# Patient Record
Sex: Female | Born: 1980
Health system: Southern US, Community
[De-identification: ages and names within clinical notes are randomized; demographics above are authoritative.]

## PROBLEM LIST (undated history)

## (undated) DIAGNOSIS — K259 Gastric ulcer, unspecified as acute or chronic, without hemorrhage or perforation: Secondary | ICD-10-CM

## (undated) DIAGNOSIS — N83209 Unspecified ovarian cyst, unspecified side: Secondary | ICD-10-CM

## (undated) DIAGNOSIS — I10 Essential (primary) hypertension: Secondary | ICD-10-CM

## (undated) DIAGNOSIS — K2270 Barrett's esophagus without dysplasia: Secondary | ICD-10-CM

## (undated) DIAGNOSIS — D259 Leiomyoma of uterus, unspecified: Secondary | ICD-10-CM

## (undated) DIAGNOSIS — J939 Pneumothorax, unspecified: Secondary | ICD-10-CM

## (undated) HISTORY — PX: ABDOMINAL HYSTERECTOMY: SHX81

## (undated) HISTORY — PX: HAND SURGERY: SHX662

## (undated) HISTORY — PX: CARPAL TUNNEL RELEASE: SHX101

## (undated) HISTORY — PX: OTHER SURGICAL HISTORY: SHX169

## (undated) HISTORY — PX: HERNIA REPAIR: SHX51

## (undated) HISTORY — PX: BREAST LUMPECTOMY: SHX2

## (undated) HISTORY — PX: BREAST SURGERY: SHX581

## (undated) HISTORY — PX: HEMORRHOID SURGERY: SHX153

---

## 2009-12-21 ENCOUNTER — Emergency Department (HOSPITAL_BASED_OUTPATIENT_CLINIC_OR_DEPARTMENT_OTHER): Admission: EM | Admit: 2009-12-21 | Discharge: 2009-12-21 | Payer: Self-pay | Admitting: Emergency Medicine

## 2009-12-28 ENCOUNTER — Emergency Department (HOSPITAL_BASED_OUTPATIENT_CLINIC_OR_DEPARTMENT_OTHER): Admission: EM | Admit: 2009-12-28 | Discharge: 2009-12-28 | Payer: Self-pay | Admitting: Emergency Medicine

## 2009-12-28 ENCOUNTER — Ambulatory Visit: Payer: Self-pay | Admitting: Diagnostic Radiology

## 2010-04-23 ENCOUNTER — Ambulatory Visit: Payer: Self-pay | Admitting: Diagnostic Radiology

## 2010-04-23 ENCOUNTER — Emergency Department (HOSPITAL_BASED_OUTPATIENT_CLINIC_OR_DEPARTMENT_OTHER): Admission: EM | Admit: 2010-04-23 | Discharge: 2010-04-23 | Payer: Self-pay | Admitting: Emergency Medicine

## 2010-05-15 ENCOUNTER — Ambulatory Visit (HOSPITAL_COMMUNITY): Admission: RE | Admit: 2010-05-15 | Discharge: 2010-05-15 | Payer: Self-pay | Admitting: General Surgery

## 2010-06-15 ENCOUNTER — Ambulatory Visit: Payer: Self-pay | Admitting: Obstetrics and Gynecology

## 2010-06-21 ENCOUNTER — Ambulatory Visit (HOSPITAL_COMMUNITY): Admission: RE | Admit: 2010-06-21 | Discharge: 2010-06-21 | Payer: Self-pay | Admitting: Family Medicine

## 2010-06-30 ENCOUNTER — Ambulatory Visit: Payer: Self-pay | Admitting: Obstetrics and Gynecology

## 2010-06-30 ENCOUNTER — Encounter (INDEPENDENT_AMBULATORY_CARE_PROVIDER_SITE_OTHER): Payer: Self-pay | Admitting: *Deleted

## 2010-06-30 LAB — CONVERTED CEMR LAB: TSH: 0.292 microintl units/mL — ABNORMAL LOW (ref 0.350–4.500)

## 2010-07-13 ENCOUNTER — Ambulatory Visit: Payer: Self-pay | Admitting: Obstetrics & Gynecology

## 2010-07-13 ENCOUNTER — Encounter: Payer: Self-pay | Admitting: Obstetrics & Gynecology

## 2010-07-13 LAB — CONVERTED CEMR LAB
Free T4: 0.92 ng/dL (ref 0.80–1.80)
T3, Free: 2.7 pg/mL (ref 2.3–4.2)

## 2010-08-08 ENCOUNTER — Emergency Department (HOSPITAL_BASED_OUTPATIENT_CLINIC_OR_DEPARTMENT_OTHER): Admission: EM | Admit: 2010-08-08 | Discharge: 2010-08-08 | Payer: Self-pay | Admitting: Emergency Medicine

## 2010-08-08 ENCOUNTER — Ambulatory Visit: Payer: Self-pay | Admitting: Diagnostic Radiology

## 2011-02-25 LAB — URINALYSIS, ROUTINE W REFLEX MICROSCOPIC
Bilirubin Urine: NEGATIVE
Glucose, UA: NEGATIVE mg/dL
Ketones, ur: NEGATIVE mg/dL
Leukocytes, UA: NEGATIVE
Nitrite: NEGATIVE
Protein, ur: NEGATIVE mg/dL
Specific Gravity, Urine: 1.02 (ref 1.005–1.030)
Urobilinogen, UA: 1 mg/dL (ref 0.0–1.0)
pH: 6 (ref 5.0–8.0)

## 2011-02-25 LAB — URINE CULTURE
Colony Count: NO GROWTH
Culture: NO GROWTH

## 2011-02-25 LAB — URINE MICROSCOPIC-ADD ON

## 2011-02-26 LAB — CBC
HCT: 34 % — ABNORMAL LOW (ref 36.0–46.0)
HCT: 34.6 % — ABNORMAL LOW (ref 36.0–46.0)
Hemoglobin: 11.6 g/dL — ABNORMAL LOW (ref 12.0–15.0)
Hemoglobin: 11.4 g/dL — ABNORMAL LOW (ref 12.0–15.0)
MCHC: 34.2 g/dL (ref 30.0–36.0)
MCHC: 33.1 g/dL (ref 30.0–36.0)
MCV: 84 fL (ref 78.0–100.0)
MCV: 83.2 fL (ref 78.0–100.0)
Platelets: 264 10*3/uL (ref 150–400)
Platelets: 305 10*3/uL (ref 150–400)
RBC: 4.05 MIL/uL (ref 3.87–5.11)
RBC: 4.16 MIL/uL (ref 3.87–5.11)
RDW: 16.2 % — ABNORMAL HIGH (ref 11.5–15.5)
RDW: 15.1 % (ref 11.5–15.5)
WBC: 6.8 10*3/uL (ref 4.0–10.5)
WBC: 7.7 10*3/uL (ref 4.0–10.5)

## 2011-02-26 LAB — URINALYSIS, ROUTINE W REFLEX MICROSCOPIC
Bilirubin Urine: NEGATIVE
Glucose, UA: NEGATIVE mg/dL
Hgb urine dipstick: NEGATIVE
Ketones, ur: NEGATIVE mg/dL
Nitrite: NEGATIVE
Protein, ur: NEGATIVE mg/dL
Specific Gravity, Urine: 1.019 (ref 1.005–1.030)
Urobilinogen, UA: 1 mg/dL (ref 0.0–1.0)
pH: 5.5 (ref 5.0–8.0)

## 2011-02-26 LAB — BASIC METABOLIC PANEL
BUN: 7 mg/dL (ref 6–23)
CO2: 26 mEq/L (ref 19–32)
Calcium: 9.3 mg/dL (ref 8.4–10.5)
Chloride: 105 mEq/L (ref 96–112)
Creatinine, Ser: 0.63 mg/dL (ref 0.4–1.2)
GFR calc Af Amer: >60 mL/min (ref >60)
GFR calc non Af Amer: >60 mL/min (ref >60)
Glucose, Bld: 93 mg/dL (ref 70–99)
Potassium: 4.5 mEq/L (ref 3.5–5.1)
Sodium: 134 mEq/L — ABNORMAL LOW (ref 135–145)

## 2011-02-26 LAB — COMPREHENSIVE METABOLIC PANEL
ALT: 6 U/L (ref 0–35)
AST: 18 U/L (ref 0–37)
Albumin: 4.2 g/dL (ref 3.5–5.2)
Alkaline Phosphatase: 101 U/L (ref 39–117)
BUN: 9 mg/dL (ref 6–23)
CO2: 24 mEq/L (ref 19–32)
Calcium: 9.5 mg/dL (ref 8.4–10.5)
Chloride: 107 mEq/L (ref 96–112)
Creatinine, Ser: 0.6 mg/dL (ref 0.4–1.2)
GFR calc Af Amer: >60 mL/min (ref >60)
GFR calc non Af Amer: >60 mL/min (ref >60)
Glucose, Bld: 92 mg/dL (ref 70–99)
Potassium: 3.8 mEq/L (ref 3.5–5.1)
Sodium: 143 mEq/L (ref 135–145)
Total Bilirubin: 0.4 mg/dL (ref 0.3–1.2)
Total Protein: 8.1 g/dL (ref 6.0–8.3)

## 2011-02-26 LAB — PREGNANCY, URINE: Preg Test, Ur: NEGATIVE

## 2011-02-26 LAB — DIFFERENTIAL
Basophils Absolute: 0.1 10*3/uL (ref 0.0–0.1)
Basophils Relative: 1 % (ref 0–1)
Eosinophils Absolute: 0.1 10*3/uL (ref 0.0–0.7)
Eosinophils Relative: 1 % (ref 0–5)
Lymphocytes Relative: 43 % (ref 12–46)
Lymphs Abs: 3.3 10*3/uL (ref 0.7–4.0)
Monocytes Absolute: 0.7 10*3/uL (ref 0.1–1.0)
Monocytes Relative: 9 % (ref 3–12)
Neutro Abs: 3.5 10*3/uL (ref 1.7–7.7)
Neutrophils Relative %: 46 % (ref 43–77)

## 2011-02-26 LAB — LIPASE, BLOOD: Lipase: 248 U/L (ref 23–300)

## 2012-03-18 ENCOUNTER — Emergency Department (HOSPITAL_BASED_OUTPATIENT_CLINIC_OR_DEPARTMENT_OTHER)
Admission: EM | Admit: 2012-03-18 | Discharge: 2012-03-18 | Disposition: A | Discharge status: Home / Self Care | Payer: No Typology Code available for payment source | Attending: Emergency Medicine | Admitting: Emergency Medicine

## 2012-03-18 ENCOUNTER — Encounter (HOSPITAL_BASED_OUTPATIENT_CLINIC_OR_DEPARTMENT_OTHER): Payer: Self-pay

## 2012-03-18 DIAGNOSIS — M549 Dorsalgia, unspecified: Secondary | ICD-10-CM

## 2012-03-18 DIAGNOSIS — R51 Headache: Secondary | ICD-10-CM | POA: Insufficient documentation

## 2012-03-18 MED ORDER — CLOTRIMAZOLE 1 % EX CREA: TOPICAL_CREAM | CUTANEOUS | Status: DC

## 2012-03-18 MED ORDER — HYDROCODONE-ACETAMINOPHEN 5-325 MG PO TABS: 2.0000 | ORAL_TABLET | ORAL | Status: AC | PRN

## 2012-03-18 MED ORDER — IBUPROFEN 800 MG PO TABS: 800.0000 mg | ORAL_TABLET | Freq: Three times a day (TID) | ORAL | Status: AC

## 2012-03-18 MED ORDER — CLOTRIMAZOLE 1 % EX CREA: TOPICAL_CREAM | CUTANEOUS | Status: AC

## 2012-03-18 NOTE — Discharge Instructions (Signed)
Back Pain, Adult Low back pain is very common. About 1 in 5 people have back pain.The cause of low back pain is rarely dangerous. The pain often gets better over time.About half of people with a sudden onset of back pain feel better in just 2 weeks. About 8 in 10 people feel better by 6 weeks.  CAUSES Some common causes of back pain include:  Strain of the muscles or ligaments supporting the spine.   Wear and tear (degeneration) of the spinal discs.   Arthritis.   Direct injury to the back.  DIAGNOSIS Most of the time, the direct cause of low back pain is not known.However, back pain can be treated effectively even when the exact cause of the pain is unknown.Answering your caregiver's questions about your overall health and symptoms is one of the most accurate ways to make sure the cause of your pain is not dangerous. If your caregiver needs more information, he or she may order lab work or imaging tests (X-rays or MRIs).However, even if imaging tests show changes in your back, this usually does not require surgery. HOME CARE INSTRUCTIONS For many people, back pain returns.Since low back pain is rarely dangerous, it is often a condition that people can learn to manageon their own.   Remain active. It is stressful on the back to sit or stand in one place. Do not sit, drive, or stand in one place for more than 30 minutes at a time. Take short walks on level surfaces as soon as pain allows.Try to increase the length of time you walk each day.   Do not stay in bed.Resting more than 1 or 2 days can delay your recovery.   Do not avoid exercise or work.Your body is made to move.It is not dangerous to be active, even though your back may hurt.Your back will likely heal faster if you return to being active before your pain is gone.   Pay attention to your body when you bend and lift. Many people have less discomfortwhen lifting if they bend their knees, keep the load close to their  bodies,and avoid twisting. Often, the most comfortable positions are those that put less stress on your recovering back.   Find a comfortable position to sleep. Use a firm mattress and lie on your side with your knees slightly bent. If you lie on your back, put a pillow under your knees.   Only take over-the-counter or prescription medicines as directed by your caregiver. Over-the-counter medicines to reduce pain and inflammation are often the most helpful.Your caregiver may prescribe muscle relaxant drugs.These medicines help dull your pain so you can more quickly return to your normal activities and healthy exercise.   Put ice on the injured area.   Put ice in a plastic bag.   Place a towel between your skin and the bag.   Leave the ice on for 15 to 20 minutes, 3 to 4 times a day for the first 2 to 3 days. After that, ice and heat may be alternated to reduce pain and spasms.   Ask your caregiver about trying back exercises and gentle massage. This may be of some benefit.   Avoid feeling anxious or stressed.Stress increases muscle tension and can worsen back pain.It is important to recognize when you are anxious or stressed and learn ways to manage it.Exercise is a great option.  SEEK MEDICAL CARE IF:  You have pain that is not relieved with rest or medicine.   You have   pain that does not improve in 1 week.   You have new symptoms.   You are generally not feeling well.  SEEK IMMEDIATE MEDICAL CARE IF:   You have pain that radiates from your back into your legs.   You develop new bowel or bladder control problems.   You have unusual weakness or numbness in your arms or legs.   You develop nausea or vomiting.   You develop abdominal pain.   You feel faint.  Document Released: 11/26/2005 Document Revised: 11/15/2011 Document Reviewed: 04/16/2011 [employer] Patient Information 2012 [employer]. 
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
## 2012-03-19 NOTE — ED Provider Notes (Signed)
Medical screening examination/treatment/procedure(s) were performed by non-physician practitioner and as supervising physician I was immediately available for consultation/collaboration.  Vaibhav Fogleman, MD 03/19/12 0025 

## 2013-09-12 ENCOUNTER — Emergency Department (HOSPITAL_BASED_OUTPATIENT_CLINIC_OR_DEPARTMENT_OTHER): Payer: No Typology Code available for payment source

## 2013-09-12 ENCOUNTER — Encounter (HOSPITAL_BASED_OUTPATIENT_CLINIC_OR_DEPARTMENT_OTHER): Payer: Self-pay | Admitting: Emergency Medicine

## 2013-09-12 ENCOUNTER — Emergency Department (HOSPITAL_BASED_OUTPATIENT_CLINIC_OR_DEPARTMENT_OTHER)
Admission: EM | Admit: 2013-09-12 | Discharge: 2013-09-12 | Disposition: A | Discharge status: Home / Self Care | Payer: No Typology Code available for payment source | Attending: Emergency Medicine | Admitting: Emergency Medicine

## 2013-09-12 DIAGNOSIS — R0789 Other chest pain: Secondary | ICD-10-CM

## 2013-09-12 DIAGNOSIS — S139XXA Sprain of joints and ligaments of unspecified parts of neck, initial encounter: Secondary | ICD-10-CM | POA: Insufficient documentation

## 2013-09-12 DIAGNOSIS — Z8679 Personal history of other diseases of the circulatory system: Secondary | ICD-10-CM | POA: Insufficient documentation

## 2013-09-12 DIAGNOSIS — S161XXA Strain of muscle, fascia and tendon at neck level, initial encounter: Secondary | ICD-10-CM

## 2013-09-12 DIAGNOSIS — Y92410 Unspecified street and highway as the place of occurrence of the external cause: Secondary | ICD-10-CM | POA: Insufficient documentation

## 2013-09-12 DIAGNOSIS — S298XXA Other specified injuries of thorax, initial encounter: Secondary | ICD-10-CM | POA: Insufficient documentation

## 2013-09-12 DIAGNOSIS — Y9389 Activity, other specified: Secondary | ICD-10-CM | POA: Insufficient documentation

## 2013-09-12 DIAGNOSIS — S0990XA Unspecified injury of head, initial encounter: Secondary | ICD-10-CM | POA: Insufficient documentation

## 2013-09-12 DIAGNOSIS — F17200 Nicotine dependence, unspecified, uncomplicated: Secondary | ICD-10-CM | POA: Insufficient documentation

## 2013-09-12 DIAGNOSIS — S3981XA Other specified injuries of abdomen, initial encounter: Secondary | ICD-10-CM | POA: Insufficient documentation

## 2013-09-12 MED ORDER — CYCLOBENZAPRINE HCL 10 MG PO TABS: 10.0000 mg | ORAL_TABLET | Freq: Two times a day (BID) | ORAL | Status: DC | PRN

## 2013-09-12 MED ORDER — HYDROCODONE-ACETAMINOPHEN 5-325 MG PO TABS: 1.0000 | ORAL_TABLET | ORAL | Status: DC | PRN

## 2013-09-12 MED ORDER — ONDANSETRON HCL 4 MG/2ML IJ SOLN: 4.0000 mg | Freq: Once | INTRAMUSCULAR | Status: DC

## 2013-09-12 MED ORDER — HYDROMORPHONE HCL PF 1 MG/ML IJ SOLN: 0.5000 mg | Freq: Once | INTRAMUSCULAR | Status: DC

## 2013-09-12 MED ORDER — NAPROXEN 500 MG PO TABS: 500.0000 mg | ORAL_TABLET | Freq: Two times a day (BID) | ORAL | Status: DC

## 2013-09-12 MED ORDER — SODIUM CHLORIDE 0.9 % IV SOLN: INTRAVENOUS | Status: DC

## 2013-09-12 NOTE — ED Provider Notes (Signed)
Medical screening examination/treatment/procedure(s) were performed by non-physician practitioner and as supervising physician I was immediately available for consultation/collaboration.  Doug Sou, MD 09/12/13 1734
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
## 2013-09-15 ENCOUNTER — Encounter (HOSPITAL_BASED_OUTPATIENT_CLINIC_OR_DEPARTMENT_OTHER): Payer: Self-pay | Admitting: Emergency Medicine

## 2013-09-15 DIAGNOSIS — Z791 Long term (current) use of non-steroidal anti-inflammatories (NSAID): Secondary | ICD-10-CM | POA: Insufficient documentation

## 2013-09-15 DIAGNOSIS — G43909 Migraine, unspecified, not intractable, without status migrainosus: Secondary | ICD-10-CM | POA: Insufficient documentation

## 2013-09-15 DIAGNOSIS — M545 Low back pain, unspecified: Secondary | ICD-10-CM | POA: Insufficient documentation

## 2013-09-15 DIAGNOSIS — F17200 Nicotine dependence, unspecified, uncomplicated: Secondary | ICD-10-CM | POA: Insufficient documentation

## 2013-09-15 DIAGNOSIS — G8911 Acute pain due to trauma: Secondary | ICD-10-CM | POA: Insufficient documentation

## 2013-09-15 DIAGNOSIS — M25519 Pain in unspecified shoulder: Secondary | ICD-10-CM | POA: Insufficient documentation
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
## 2013-09-16 ENCOUNTER — Emergency Department (HOSPITAL_BASED_OUTPATIENT_CLINIC_OR_DEPARTMENT_OTHER)
Admission: EM | Admit: 2013-09-16 | Discharge: 2013-09-16 | Disposition: A | Discharge status: Home / Self Care | Payer: No Typology Code available for payment source | Attending: Emergency Medicine | Admitting: Emergency Medicine

## 2013-09-16 DIAGNOSIS — S46911D Strain of unspecified muscle, fascia and tendon at shoulder and upper arm level, right arm, subsequent encounter: Secondary | ICD-10-CM

## 2013-09-16 DIAGNOSIS — S39012D Strain of muscle, fascia and tendon of lower back, subsequent encounter: Secondary | ICD-10-CM

## 2013-09-16 MED ORDER — METHOCARBAMOL 500 MG PO TABS: 1000.0000 mg | ORAL_TABLET | Freq: Four times a day (QID) | ORAL | Status: DC

## 2013-09-16 MED ORDER — OXYCODONE-ACETAMINOPHEN 5-325 MG PO TABS: 1.0000 | ORAL_TABLET | Freq: Four times a day (QID) | ORAL | Status: DC | PRN

## 2013-09-16 MED ORDER — OXYCODONE-ACETAMINOPHEN 5-325 MG PO TABS
2.0000 | ORAL_TABLET | Freq: Once | ORAL | Status: AC
  Administered 2013-09-16: 2 via ORAL
  Filled 2013-09-16: qty 2

## 2013-09-16 MED ORDER — CYCLOBENZAPRINE HCL 10 MG PO TABS
10.0000 mg | ORAL_TABLET | Freq: Once | ORAL | Status: AC
  Administered 2013-09-16: 10 mg via ORAL
  Filled 2013-09-16: qty 1

## 2013-09-16 NOTE — ED Notes (Signed)
MD at bedside. 

## 2013-09-16 NOTE — ED Notes (Signed)
See downtime form for primary and secondary assessment
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
## 2013-09-24 ENCOUNTER — Encounter (HOSPITAL_BASED_OUTPATIENT_CLINIC_OR_DEPARTMENT_OTHER): Payer: Self-pay | Admitting: Emergency Medicine

## 2013-09-24 ENCOUNTER — Emergency Department (HOSPITAL_BASED_OUTPATIENT_CLINIC_OR_DEPARTMENT_OTHER)
Admission: EM | Admit: 2013-09-24 | Discharge: 2013-09-24 | Disposition: A | Discharge status: Home / Self Care | Payer: No Typology Code available for payment source | Attending: Emergency Medicine | Admitting: Emergency Medicine

## 2013-09-24 DIAGNOSIS — F17200 Nicotine dependence, unspecified, uncomplicated: Secondary | ICD-10-CM | POA: Insufficient documentation

## 2013-09-24 DIAGNOSIS — Z79899 Other long term (current) drug therapy: Secondary | ICD-10-CM | POA: Insufficient documentation

## 2013-09-24 DIAGNOSIS — G43909 Migraine, unspecified, not intractable, without status migrainosus: Secondary | ICD-10-CM | POA: Insufficient documentation

## 2013-09-24 DIAGNOSIS — Y92410 Unspecified street and highway as the place of occurrence of the external cause: Secondary | ICD-10-CM | POA: Insufficient documentation

## 2013-09-24 DIAGNOSIS — M25512 Pain in left shoulder: Secondary | ICD-10-CM

## 2013-09-24 DIAGNOSIS — S46909A Unspecified injury of unspecified muscle, fascia and tendon at shoulder and upper arm level, unspecified arm, initial encounter: Secondary | ICD-10-CM | POA: Insufficient documentation

## 2013-09-24 DIAGNOSIS — S4980XA Other specified injuries of shoulder and upper arm, unspecified arm, initial encounter: Secondary | ICD-10-CM | POA: Insufficient documentation

## 2013-09-24 DIAGNOSIS — Z791 Long term (current) use of non-steroidal anti-inflammatories (NSAID): Secondary | ICD-10-CM | POA: Insufficient documentation

## 2013-09-24 DIAGNOSIS — Y9389 Activity, other specified: Secondary | ICD-10-CM | POA: Insufficient documentation

## 2013-09-24 MED ORDER — METHOCARBAMOL 500 MG PO TABS: 1000.0000 mg | ORAL_TABLET | Freq: Four times a day (QID) | ORAL | Status: DC
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
## 2013-10-07 NOTE — ED Provider Notes (Signed)
Medical screening examination/treatment/procedure(s) were performed by non-physician practitioner and as supervising physician I was immediately available for consultation/collaboration.   Tresia Revolorio J. Kiyon Fidalgo, MD 10/07/13 0506 

## 2013-10-26 NOTE — ED Notes (Signed)
Pt called requesting that work note be reprinted. Pt will show photo ID and note will be left at registration desk.

## 2013-10-26 NOTE — ED Notes (Signed)
Pt called requesting work note reprinted. 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

## 2014-07-05 ENCOUNTER — Encounter (HOSPITAL_BASED_OUTPATIENT_CLINIC_OR_DEPARTMENT_OTHER): Payer: Self-pay | Admitting: Emergency Medicine

## 2014-07-05 ENCOUNTER — Emergency Department (HOSPITAL_BASED_OUTPATIENT_CLINIC_OR_DEPARTMENT_OTHER)
Admission: EM | Admit: 2014-07-05 | Discharge: 2014-07-05 | Disposition: A | Discharge status: Home / Self Care | Payer: Medicaid Other | Attending: Emergency Medicine | Admitting: Emergency Medicine

## 2014-07-05 DIAGNOSIS — Z79899 Other long term (current) drug therapy: Secondary | ICD-10-CM | POA: Diagnosis not present

## 2014-07-05 DIAGNOSIS — G5603 Carpal tunnel syndrome, bilateral upper limbs: Secondary | ICD-10-CM

## 2014-07-05 DIAGNOSIS — M25539 Pain in unspecified wrist: Secondary | ICD-10-CM | POA: Insufficient documentation

## 2014-07-05 DIAGNOSIS — G43909 Migraine, unspecified, not intractable, without status migrainosus: Secondary | ICD-10-CM | POA: Diagnosis not present

## 2014-07-05 DIAGNOSIS — F17200 Nicotine dependence, unspecified, uncomplicated: Secondary | ICD-10-CM | POA: Diagnosis not present

## 2014-07-05 DIAGNOSIS — Z791 Long term (current) use of non-steroidal anti-inflammatories (NSAID): Secondary | ICD-10-CM | POA: Diagnosis not present

## 2014-07-05 DIAGNOSIS — G5600 Carpal tunnel syndrome, unspecified upper limb: Secondary | ICD-10-CM | POA: Insufficient documentation

## 2014-07-05 MED ORDER — IBUPROFEN 800 MG PO TABS
800.0000 mg | ORAL_TABLET | Freq: Once | ORAL | Status: AC
  Administered 2014-07-05: 800 mg via ORAL
  Filled 2014-07-05: qty 1

## 2014-07-05 MED ORDER — IBUPROFEN 600 MG PO TABS: 600.0000 mg | ORAL_TABLET | Freq: Four times a day (QID) | ORAL | Status: DC | PRN

## 2014-07-05 NOTE — ED Notes (Signed)
bilat velcro splints applied by Peggye Form RN

## 2014-07-05 NOTE — ED Provider Notes (Signed)
CSN: [id number]     Arrival date & time 07/05/14  1858 History  This chart was scribed for Osvaldo Shipper, MD by Lowella Petties, ED Scribe. The patient was seen in room MH02/MH02. Patient's care was started at 9:25 PM.    Chief Complaint  Patient presents with  . Hand Pain   Patient is a 33 y.o. female presenting with hand pain. The history is provided by the patient. No language interpreter was used.  Hand Pain This is a new problem. The current episode started more than 2 days ago. The problem has been gradually worsening. Pertinent negatives include no chest pain, no abdominal pain, no headaches and no shortness of breath. The symptoms are aggravated by bending. The symptoms are relieved by relaxation. She has tried nothing for the symptoms.   HPI Comments: Tiffany Christensen is a 33 y.o. female who presents to the Emergency Department complaining of pain in her wrists and severe numbness and tingling in her hands bilaterally. She reports that the pain radiates into her arms. She states that her the sensation is worse on her right side. 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 She denies injury.   Past Medical History  Diagnosis Date  . Migraine    Past Surgical History  Procedure Laterality Date  . Hemmorhoidectomy    . Hernia repair    . Breast surgery     History reviewed. No pertinent family history. History  Substance Use Topics  . Smoking status: Current Every Day Smoker -- 0.50 packs/day    Types: Cigarettes  . Smokeless tobacco: Not on file  . Alcohol Use: No   OB History   Grav Para Term Preterm Abortions TAB SAB Ect Mult Living                 Review of Systems  Respiratory: Negative for shortness of breath.   Cardiovascular: Negative for chest pain.  Gastrointestinal: Negative for abdominal pain.  Neurological: Positive for numbness (hands bilaterally). Negative for headaches.  All other systems reviewed and are negative.     Allergies   Codeine  Home Medications   Prior to Admission medications   Medication Sig Start Date End Date Taking? Authorizing Provider  cyclobenzaprine (FLEXERIL) 10 MG tablet Take 1 tablet (10 mg total) by mouth 2 (two) times daily as needed for muscle spasms. 09/12/13   Hope Bunnie Pion, NP  HYDROcodone-acetaminophen (NORCO/VICODIN) 5-325 MG per tablet Take 1 tablet by mouth every 4 (four) hours as needed for pain. 09/12/13   Hope Bunnie Pion, NP  methocarbamol (ROBAXIN) 500 MG tablet Take 2 tablets (1,000 mg total) by mouth 4 (four) times daily. 09/24/13   Antonietta Breach, PA-C  naproxen (NAPROSYN) 500 MG tablet Take 1 tablet (500 mg total) by mouth 2 (two) times daily. 09/12/13   Hope Bunnie Pion, NP  oxyCODONE-acetaminophen (PERCOCET) 5-325 MG per tablet Take 1-2 tablets by mouth every 6 (six) hours as needed for pain. 09/16/13   Karen Chafe Molpus, MD   Triage Vitals: BP 120/80  Pulse 85  Temp(Src) 98.3 F (36.8 C) (Oral)  Resp 16  Ht 5\' 5"  (1.651 m)  Wt 140 lb (63.504 kg)  BMI 23.30 kg/m2  SpO2 100%  LMP 06/09/2014 Physical Exam  Nursing note and vitals reviewed. Constitutional: She is oriented to person, place, and time. She appears well-developed and well-nourished. No distress.  HENT:  Head: Normocephalic and atraumatic.  Mouth/Throat: Oropharynx is clear and moist.  Eyes: Conjunctivae  and EOM are normal. Pupils are equal, round, and reactive to light.  Neck: Normal range of motion. Neck supple. No tracheal deviation present.  Cardiovascular: Normal rate and regular rhythm.  Exam reveals no friction rub.   No murmur heard. Pulmonary/Chest: Effort normal and breath sounds normal. No respiratory distress. She has no wheezes. She has no rales.  Abdominal: Soft. She exhibits no distension. There is no tenderness. There is no rebound.  Musculoskeletal: Normal range of motion. She exhibits no edema.  Tinel and Phalen signs positive on bilateral hands/wrists  Neurological: She is alert and oriented to person,  place, and time.  Skin: Skin is warm and dry. She is not diaphoretic.  Psychiatric: She has a normal mood and affect. Her behavior is normal.    ED Course  Procedures (including critical care time) DIAGNOSTIC STUDIES: Oxygen Saturation is 100% on room air, normal by my interpretation.    COORDINATION OF CARE: 9:31 PM-Discussed treatment plan which includes splints and NSAID's with pt at bedside and pt agreed to plan.   Labs Review Labs Reviewed - No data to display  Imaging Review No results found.   EKG Interpretation None      MDM   Final diagnoses:  Bilateral carpal tunnel syndrome    62F here with hand and wrist pain. R>L. Radiates up to elbows. Repetitive hand motions at new job she just started. Exam c/w carpal tunnel bilaterally. Given cock-up splints, NSAIDs, hand surgery referral.   I personally performed the services described in this documentation, which was scribed in my presence. The recorded information has been reviewed and is accurate.     Osvaldo Shipper, MD 07/06/14 [phone]

## 2014-07-05 NOTE — Discharge Instructions (Signed)
Carpal Tunnel Syndrome The carpal tunnel is a narrow area located on the palm side of your wrist. The tunnel is formed by the wrist bones and ligaments. Nerves, blood vessels, and tendons pass through the carpal tunnel. Repeated wrist motion or certain diseases may cause swelling within the tunnel. This swelling pinches the main nerve in the wrist (median nerve) and causes the painful hand and arm condition called carpal tunnel syndrome. CAUSES   Repeated wrist motions.  Wrist injuries.  Certain diseases like arthritis, diabetes, alcoholism, hyperthyroidism, and kidney failure.  Obesity.  Pregnancy. SYMPTOMS   A "pins and needles" feeling in your fingers or hand, especially in your thumb, index and middle fingers.  Tingling or numbness in your fingers or hand.  An aching feeling in your entire arm, especially when your wrist and elbow are bent for long periods of time.  Wrist pain that goes up your arm to your shoulder.  Pain that goes down into your palm or fingers.  A weak feeling in your hands. DIAGNOSIS  Your health care provider will take your history and perform a physical exam. An electromyography test may be needed. This test measures electrical signals sent out by your nerves into the muscles. The electrical signals are usually slowed by carpal tunnel syndrome. You may also need X-rays. TREATMENT  Carpal tunnel syndrome may clear up by itself. Your health care provider may recommend a wrist splint or medicine such as a nonsteroidal anti-inflammatory medicine. Cortisone injections may help. Sometimes, surgery may be needed to free the pinched nerve.  HOME CARE INSTRUCTIONS   Take all medicine as directed by your health care provider. Only take over-the-counter or prescription medicines for pain, discomfort, or fever as directed by your health care provider.  If you were given a splint to keep your wrist from bending, wear it as directed. It is important to wear the splint at  night. Wear the splint for as long as you have pain or numbness in your hand, arm, or wrist. This may take 1 to 2 months.  Rest your wrist from any activity that may be causing your pain. If your symptoms are work-related, you may need to talk to your employer about changing to a job that does not require using your wrist.  Put ice on your wrist after long periods of wrist activity.  Put ice in a plastic bag.  Place a towel between your skin and the bag.  Leave the ice on for 15-20 minutes, 03-04 times a day.  Keep all follow-up visits as directed by your health care provider. This includes any orthopedic referrals, physical therapy, and rehabilitation. Any delay in getting necessary care could result in a delay or failure of your condition to heal. SEEK IMMEDIATE MEDICAL CARE IF:   You have new, unexplained symptoms.  Your symptoms get worse and are not helped or controlled with medicines. MAKE SURE YOU:   Understand these instructions.  Will watch your condition.  Will get help right away if you are not doing well or get worse. Document Released: 11/23/2000 Document Revised: 04/12/2014 Document Reviewed: 10/12/2011 ExitCare Patient Information 2015 [employer]. This information is not intended to replace advice given to you by your health care provider. Make sure you discuss any questions you have with your health care provider.  

## 2014-07-05 NOTE — ED Notes (Signed)
Bil hand and wrist pain radiates to bil elbows with numbness at times

## 2015-05-17 ENCOUNTER — Encounter (HOSPITAL_BASED_OUTPATIENT_CLINIC_OR_DEPARTMENT_OTHER): Payer: Self-pay | Admitting: *Deleted

## 2015-05-17 ENCOUNTER — Emergency Department (HOSPITAL_BASED_OUTPATIENT_CLINIC_OR_DEPARTMENT_OTHER)
Admission: EM | Admit: 2015-05-17 | Discharge: 2015-05-18 | Disposition: A | Discharge status: Home / Self Care | Payer: Medicaid Other | Attending: Emergency Medicine | Admitting: Emergency Medicine

## 2015-05-17 DIAGNOSIS — Z79899 Other long term (current) drug therapy: Secondary | ICD-10-CM | POA: Insufficient documentation

## 2015-05-17 DIAGNOSIS — K5909 Other constipation: Secondary | ICD-10-CM | POA: Insufficient documentation

## 2015-05-17 DIAGNOSIS — Z791 Long term (current) use of non-steroidal anti-inflammatories (NSAID): Secondary | ICD-10-CM | POA: Diagnosis not present

## 2015-05-17 DIAGNOSIS — R1030 Lower abdominal pain, unspecified: Secondary | ICD-10-CM | POA: Diagnosis present

## 2015-05-17 DIAGNOSIS — Z3202 Encounter for pregnancy test, result negative: Secondary | ICD-10-CM | POA: Diagnosis not present

## 2015-05-17 DIAGNOSIS — G43909 Migraine, unspecified, not intractable, without status migrainosus: Secondary | ICD-10-CM | POA: Insufficient documentation

## 2015-05-17 DIAGNOSIS — Z720 Tobacco use: Secondary | ICD-10-CM | POA: Diagnosis not present

## 2015-05-17 DIAGNOSIS — K5900 Constipation, unspecified: Secondary | ICD-10-CM

## 2015-05-17 DIAGNOSIS — D259 Leiomyoma of uterus, unspecified: Secondary | ICD-10-CM | POA: Diagnosis not present

## 2015-05-17 DIAGNOSIS — R109 Unspecified abdominal pain: Secondary | ICD-10-CM

## 2015-05-17 LAB — CBC WITH DIFFERENTIAL/PLATELET
Basophils Absolute: 0 10*3/uL (ref 0.0–0.1)
Basophils Relative: 0 % (ref 0–1)
Eosinophils Absolute: 0.1 10*3/uL (ref 0.0–0.7)
Eosinophils Relative: 1 % (ref 0–5)
HCT: 28.2 % — ABNORMAL LOW (ref 36.0–46.0)
Hemoglobin: 9.2 g/dL — ABNORMAL LOW (ref 12.0–15.0)
Lymphocytes Relative: 36 % (ref 12–46)
Lymphs Abs: 2.1 10*3/uL (ref 0.7–4.0)
MCH: 24.1 pg — ABNORMAL LOW (ref 26.0–34.0)
MCHC: 32.6 g/dL (ref 30.0–36.0)
MCV: 73.8 fL — ABNORMAL LOW (ref 78.0–100.0)
Monocytes Absolute: 0.7 10*3/uL (ref 0.1–1.0)
Monocytes Relative: 11 % (ref 3–12)
Neutro Abs: 3 10*3/uL (ref 1.7–7.7)
Neutrophils Relative %: 52 % (ref 43–77)
Platelets: 353 10*3/uL (ref 150–400)
RBC: 3.82 MIL/uL — ABNORMAL LOW (ref 3.87–5.11)
RDW: 15.5 % (ref 11.5–15.5)
WBC: 5.8 10*3/uL (ref 4.0–10.5)

## 2015-05-17 LAB — URINALYSIS, ROUTINE W REFLEX MICROSCOPIC
Glucose, UA: NEGATIVE mg/dL
Hgb urine dipstick: NEGATIVE
Ketones, ur: NEGATIVE mg/dL
Leukocytes, UA: NEGATIVE
Nitrite: NEGATIVE
Protein, ur: NEGATIVE mg/dL
Specific Gravity, Urine: 1.023 (ref 1.005–1.030)
Urobilinogen, UA: 1 mg/dL (ref 0.0–1.0)
pH: 5.5 (ref 5.0–8.0)

## 2015-05-17 LAB — COMPREHENSIVE METABOLIC PANEL
ALT: 8 U/L — ABNORMAL LOW (ref 14–54)
AST: 12 U/L — ABNORMAL LOW (ref 15–41)
Albumin: 3.8 g/dL (ref 3.5–5.0)
Alkaline Phosphatase: 99 U/L (ref 38–126)
Anion gap: 5 (ref 5–15)
BUN: 7 mg/dL (ref 6–20)
CO2: 24 mmol/L (ref 22–32)
Calcium: 8.7 mg/dL — ABNORMAL LOW (ref 8.9–10.3)
Chloride: 107 mmol/L (ref 101–111)
Creatinine, Ser: 0.52 mg/dL (ref 0.44–1.00)
GFR calc Af Amer: >60 mL/min (ref >60)
GFR calc non Af Amer: >60 mL/min (ref >60)
Glucose, Bld: 100 mg/dL — ABNORMAL HIGH (ref 65–99)
Potassium: 3.2 mmol/L — ABNORMAL LOW (ref 3.5–5.1)
Sodium: 136 mmol/L (ref 135–145)
Total Bilirubin: 0.5 mg/dL (ref 0.3–1.2)
Total Protein: 7.4 g/dL (ref 6.5–8.1)

## 2015-05-17 LAB — PREGNANCY, URINE: Preg Test, Ur: NEGATIVE

## 2015-05-17 LAB — LIPASE, BLOOD: Lipase: 19 U/L — ABNORMAL LOW (ref 22–51)

## 2015-05-17 NOTE — ED Notes (Signed)
Lower abdominal pain and rectum today.

## 2015-05-17 NOTE — ED Notes (Addendum)
Pt states that she has had several procedures for her Hemorids which she feels are back. The pain just began today. Denies bleeding but reports she can feel them.

## 2015-05-18 ENCOUNTER — Emergency Department (HOSPITAL_BASED_OUTPATIENT_CLINIC_OR_DEPARTMENT_OTHER): Payer: Medicaid Other

## 2015-05-18 LAB — WET PREP, GENITAL
Trich, Wet Prep: NONE SEEN
Yeast Wet Prep HPF POC: NONE SEEN

## 2015-05-18 MED ORDER — FENTANYL CITRATE (PF) 100 MCG/2ML IJ SOLN
INTRAMUSCULAR | Status: AC
  Filled 2015-05-18: qty 2

## 2015-05-18 MED ORDER — POLYETHYLENE GLYCOL 3350 17 G PO PACK: PACK | ORAL | Status: DC

## 2015-05-18 MED ORDER — DIPHENHYDRAMINE HCL 50 MG/ML IJ SOLN
50.0000 mg | Freq: Once | INTRAMUSCULAR | Status: AC
  Administered 2015-05-18: 50 mg via INTRAVENOUS
  Filled 2015-05-18: qty 1

## 2015-05-18 MED ORDER — ONDANSETRON HCL 4 MG/2ML IJ SOLN
INTRAMUSCULAR | Status: AC
  Filled 2015-05-18: qty 2

## 2015-05-18 MED ORDER — IOHEXOL 300 MG/ML  SOLN
100.0000 mL | Freq: Once | INTRAMUSCULAR | Status: AC | PRN
  Administered 2015-05-18: 100 mL via INTRAVENOUS

## 2015-05-18 NOTE — ED Notes (Signed)
Pt reports itching all over after administration of fentanyl and zofran. Order Received

## 2015-05-18 NOTE — ED Provider Notes (Signed)
CSN: [id number]     Arrival date & time 05/17/15  2149 History   First MD Initiated Contact with Patient 05/18/15 0002     Chief Complaint  Patient presents with  . Abdominal Pain     (Consider location/radiation/quality/duration/timing/severity/associated sxs/prior Treatment) HPI  This is a 34 year old female who developed the sudden onset of abdominal pain yesterday. The pain is located in her suprapubic region and radiates to her rectum. She rates her pain as about a 7 out of 10. It is worse with movement or palpation. She denies nausea, vomiting or diarrhea. She is having constipation. She denies vaginal bleeding, vaginal discharge or urinary changes.  Past Medical History  Diagnosis Date  . Migraine    Past Surgical History  Procedure Laterality Date  . Hemmorhoidectomy    . Hernia repair    . Breast surgery     No family history on file. History  Substance Use Topics  . Smoking status: Current Every Day Smoker -- 0.50 packs/day    Types: Cigarettes  . Smokeless tobacco: Not on file  . Alcohol Use: No   OB History    No data available     Review of Systems  All other systems reviewed and are negative.   Allergies  Codeine  Home Medications   Prior to Admission medications   Medication Sig Start Date End Date Taking? Authorizing Provider  Amitriptyline HCl (ELAVIL PO) Take by mouth.   Yes Historical Provider, MD  cyclobenzaprine (FLEXERIL) 10 MG tablet Take 1 tablet (10 mg total) by mouth 2 (two) times daily as needed for muscle spasms. 09/12/13   Hope Bunnie Pion, NP  HYDROcodone-acetaminophen (NORCO/VICODIN) 5-325 MG per tablet Take 1 tablet by mouth every 4 (four) hours as needed for pain. 09/12/13   Hope Bunnie Pion, NP  ibuprofen (ADVIL,MOTRIN) 600 MG tablet Take 1 tablet (600 mg total) by mouth every 6 (six) hours as needed. 07/05/14   Evelina Bucy, MD  methocarbamol (ROBAXIN) 500 MG tablet Take 2 tablets (1,000 mg total) by mouth 4 (four) times daily. 09/24/13   Antonietta Breach, PA-C  naproxen (NAPROSYN) 500 MG tablet Take 1 tablet (500 mg total) by mouth 2 (two) times daily. 09/12/13   Hope Bunnie Pion, NP  oxyCODONE-acetaminophen (PERCOCET) 5-325 MG per tablet Take 1-2 tablets by mouth every 6 (six) hours as needed for pain. 09/16/13   Donna Snooks, MD   BP 103/67 mmHg  Pulse 81  Temp(Src) 99 F (37.2 C) (Oral)  Resp 17  Ht 5\' 5"  (1.651 m)  Wt 140 lb (63.504 kg)  BMI 23.30 kg/m2  SpO2 98%  LMP 05/10/2015   Physical Exam General: Well-developed, well-nourished female in no acute distress; appearance consistent with age of record HENT: normocephalic; atraumatic Eyes: pupils equal, round and reactive to light; extraocular muscles intact Neck: supple Heart: regular rate and rhythm Lungs: clear to auscultation bilaterally Abdomen: soft; nondistended; suprapubic tenderness; no masses or hepatosplenomegaly; bowel sounds present GU: Normal external genitalia; physiologic appearing vaginal discharge; no vaginal bleeding; no cervical motion tenderness Rectal: Noninflamed, nonthrombosed external hemorrhoids without tenderness Extremities: No deformity; full range of motion; pulses normal Neurologic: Awake, alert and oriented; motor function intact in all extremities and symmetric; no facial droop Skin: Warm and dry Psychiatric: Normal mood and affect   ED Course  Procedures (including critical care time)   MDM   Nursing notes and vitals signs, including pulse oximetry, reviewed.  Summary of this visit's results, reviewed by myself:  Labs:  Results for orders placed or performed during the hospital encounter of 05/17/15 (from the past 24 hour(s))  Pregnancy, urine     Status: None   Collection Time: 05/17/15 10:00 PM  Result Value Ref Range   Preg Test, Ur NEGATIVE NEGATIVE  Urinalysis, Routine w reflex microscopic (not at [facility])     Status: Abnormal   Collection Time: 05/17/15 10:00 PM  Result Value Ref Range   Color, Urine YELLOW YELLOW   APPearance  CLOUDY (A) CLEAR   Specific Gravity, Urine 1.023 1.005 - 1.030   pH 5.5 5.0 - 8.0   Glucose, UA NEGATIVE NEGATIVE mg/dL   Hgb urine dipstick NEGATIVE NEGATIVE   Bilirubin Urine SMALL (A) NEGATIVE   Ketones, ur NEGATIVE NEGATIVE mg/dL   Protein, ur NEGATIVE NEGATIVE mg/dL   Urobilinogen, UA 1.0 0.0 - 1.0 mg/dL   Nitrite NEGATIVE NEGATIVE   Leukocytes, UA NEGATIVE NEGATIVE  Comprehensive metabolic panel     Status: Abnormal   Collection Time: 05/17/15 11:04 PM  Result Value Ref Range   Sodium 136 135 - 145 mmol/L   Potassium 3.2 (L) 3.5 - 5.1 mmol/L   Chloride 107 101 - 111 mmol/L   CO2 24 22 - 32 mmol/L   Glucose, Bld 100 (H) 65 - 99 mg/dL   BUN 7 6 - 20 mg/dL   Creatinine, Ser 0.52 0.44 - 1.00 mg/dL   Calcium 8.7 (L) 8.9 - 10.3 mg/dL   Total Protein 7.4 6.5 - 8.1 g/dL   Albumin 3.8 3.5 - 5.0 g/dL   AST 12 (L) 15 - 41 U/L   ALT 8 (L) 14 - 54 U/L   Alkaline Phosphatase 99 38 - 126 U/L   Total Bilirubin 0.5 0.3 - 1.2 mg/dL   GFR calc non Af Amer >60 >60 mL/min   GFR calc Af Amer >60 >60 mL/min   Anion gap 5 5 - 15  CBC with Differential     Status: Abnormal   Collection Time: 05/17/15 11:04 PM  Result Value Ref Range   WBC 5.8 4.0 - 10.5 K/uL   RBC 3.82 (L) 3.87 - 5.11 MIL/uL   Hemoglobin 9.2 (L) 12.0 - 15.0 g/dL   HCT 28.2 (L) 36.0 - 46.0 %   MCV 73.8 (L) 78.0 - 100.0 fL   MCH 24.1 (L) 26.0 - 34.0 pg   MCHC 32.6 30.0 - 36.0 g/dL   RDW 15.5 11.5 - 15.5 %   Platelets 353 150 - 400 K/uL   Neutrophils Relative % 52 43 - 77 %   Neutro Abs 3.0 1.7 - 7.7 K/uL   Lymphocytes Relative 36 12 - 46 %   Lymphs Abs 2.1 0.7 - 4.0 K/uL   Monocytes Relative 11 3 - 12 %   Monocytes Absolute 0.7 0.1 - 1.0 K/uL   Eosinophils Relative 1 0 - 5 %   Eosinophils Absolute 0.1 0.0 - 0.7 K/uL   Basophils Relative 0 0 - 1 %   Basophils Absolute 0.0 0.0 - 0.1 K/uL  Lipase, blood     Status: Abnormal   Collection Time: 05/17/15 11:04 PM  Result Value Ref Range   Lipase 19 (L) 22 - 51 U/L  Wet  prep, genital     Status: Abnormal   Collection Time: 05/18/15  1:20 AM  Result Value Ref Range   Yeast Wet Prep HPF POC NONE SEEN NONE SEEN   Trich, Wet Prep NONE SEEN NONE SEEN   Clue Cells Wet Prep HPF POC MODERATE (  A) NONE SEEN   WBC, Wet Prep HPF POC FEW (A) NONE SEEN    Imaging Studies: Ct Abdomen Pelvis W Contrast  05/18/2015   CLINICAL DATA:  Initial evaluation for acute lower abdominal and rectal pain for 1 day. History of coronary repair, hemorrhoidectomy.  EXAM: CT ABDOMEN AND PELVIS WITH CONTRAST  TECHNIQUE: Multidetector CT imaging of the abdomen and pelvis was performed using the standard protocol following bolus administration of intravenous contrast.  CONTRAST:  19mL OMNIPAQUE IOHEXOL 300 MG/ML  SOLN  COMPARISON:  Prior CT from 04/23/2010.  FINDINGS: Mild subsegmental atelectasis seen dependently within the visualized lung bases. No pleural pericardial effusion.  Mild diffuse hypoattenuation of the liver is suggestive of steatosis. Liver is otherwise unremarkable. Gallbladder within normal limits. No biliary dilatation. Spleen, adrenal glands, and pancreas demonstrate a normal contrast enhanced appearance.  Kidneys are equal in size with symmetric enhancement. Subcentimeter hypodensity at the lower pole of the left kidney is too small the characterize by CT, but statistically likely reflects a small cyst. No nephrolithiasis, hydronephrosis, or focal enhancing renal mass.  Stomach within normal limits. No evidence for bowel obstruction. Appendix visualized in the right lower quadrant and is of normal caliber and appearance without associated inflammatory changes to suggest acute appendicitis. No abnormal wall thickening, mucosal enhancement, or inflammatory fat stranding seen about the bowels. Suture material present at the rectum, compatible with history prior hemorrhoidectomy. No perirectal inflammation. Moderate amount of retained stool throughout the colon, suggesting constipation.   Uterus and large with multiple enhancing fibroids. The largest of these measures 2.3 x 2.4 x 2.8 cm. Fluid present within the endometrial canal. Ovaries within normal limits.  No free air or fluid. No pathologically enlarged intra-abdominal or pelvic lymph nodes identified. Normal intravascular enhancement seen within the intra-abdominal aorta and its branch vessels.  No acute osseous abnormality. No worrisome lytic or blastic osseous lesion.  IMPRESSION: 1. No CT evidence for acute intra-abdominal or pelvic process. 2. Moderate amount of retained stool within the colon, suggesting constipation. 3. Fibroid uterus. 4. Hepatic steatosis.   Electronically Signed   By: Jeannine Boga M.D.   On: 05/18/2015 03:13      Shanon Rosser, MD 05/18/15 [phone]

## 2015-05-18 NOTE — ED Notes (Signed)
MD at bedside. 

## 2015-05-19 LAB — GC/CHLAMYDIA PROBE AMP (~~LOC~~) NOT AT ARMC
Chlamydia: NEGATIVE
Neisseria Gonorrhea: NEGATIVE

## 2015-06-05 ENCOUNTER — Encounter (HOSPITAL_BASED_OUTPATIENT_CLINIC_OR_DEPARTMENT_OTHER): Payer: Self-pay | Admitting: *Deleted

## 2015-06-05 ENCOUNTER — Emergency Department (HOSPITAL_BASED_OUTPATIENT_CLINIC_OR_DEPARTMENT_OTHER)
Admission: EM | Admit: 2015-06-05 | Discharge: 2015-06-05 | Disposition: A | Discharge status: Home / Self Care | Payer: Medicaid Other | Attending: Emergency Medicine | Admitting: Emergency Medicine

## 2015-06-05 DIAGNOSIS — Y998 Other external cause status: Secondary | ICD-10-CM | POA: Insufficient documentation

## 2015-06-05 DIAGNOSIS — Z23 Encounter for immunization: Secondary | ICD-10-CM | POA: Insufficient documentation

## 2015-06-05 DIAGNOSIS — G43909 Migraine, unspecified, not intractable, without status migrainosus: Secondary | ICD-10-CM | POA: Diagnosis not present

## 2015-06-05 DIAGNOSIS — L609 Nail disorder, unspecified: Secondary | ICD-10-CM

## 2015-06-05 DIAGNOSIS — B353 Tinea pedis: Secondary | ICD-10-CM

## 2015-06-05 DIAGNOSIS — Z79899 Other long term (current) drug therapy: Secondary | ICD-10-CM | POA: Insufficient documentation

## 2015-06-05 DIAGNOSIS — B351 Tinea unguium: Secondary | ICD-10-CM | POA: Insufficient documentation

## 2015-06-05 DIAGNOSIS — L600 Ingrowing nail: Secondary | ICD-10-CM | POA: Diagnosis not present

## 2015-06-05 DIAGNOSIS — Z720 Tobacco use: Secondary | ICD-10-CM | POA: Diagnosis not present

## 2015-06-05 DIAGNOSIS — Y9289 Other specified places as the place of occurrence of the external cause: Secondary | ICD-10-CM | POA: Insufficient documentation

## 2015-06-05 DIAGNOSIS — Y9389 Activity, other specified: Secondary | ICD-10-CM | POA: Diagnosis not present

## 2015-06-05 DIAGNOSIS — S99921A Unspecified injury of right foot, initial encounter: Secondary | ICD-10-CM | POA: Insufficient documentation

## 2015-06-05 DIAGNOSIS — M79676 Pain in unspecified toe(s): Secondary | ICD-10-CM | POA: Diagnosis present

## 2015-06-05 DIAGNOSIS — W228XXA Striking against or struck by other objects, initial encounter: Secondary | ICD-10-CM | POA: Insufficient documentation

## 2015-06-05 MED ORDER — LIDOCAINE HCL (PF) 1 % IJ SOLN
30.0000 mL | Freq: Once | INTRAMUSCULAR | Status: AC
  Administered 2015-06-05: 30 mL
  Filled 2015-06-05: qty 30

## 2015-06-05 MED ORDER — PENTAFLUOROPROP-TETRAFLUOROETH EX AERO
INHALATION_SPRAY | CUTANEOUS | Status: AC
  Administered 2015-06-05: 03:00:00
  Filled 2015-06-05: qty 30

## 2015-06-05 MED ORDER — NAPROXEN 250 MG PO TABS
500.0000 mg | ORAL_TABLET | Freq: Once | ORAL | Status: AC
  Administered 2015-06-05: 500 mg via ORAL
  Filled 2015-06-05: qty 2

## 2015-06-05 MED ORDER — TETANUS-DIPHTH-ACELL PERTUSSIS 5-2.5-18.5 LF-MCG/0.5 IM SUSP
0.5000 mL | Freq: Once | INTRAMUSCULAR | Status: AC
  Administered 2015-06-05: 0.5 mL via INTRAMUSCULAR
  Filled 2015-06-05: qty 0.5

## 2015-06-05 MED ORDER — OXYCODONE-ACETAMINOPHEN 5-325 MG PO TABS
1.0000 | ORAL_TABLET | Freq: Once | ORAL | Status: AC
  Administered 2015-06-05: 1 via ORAL
  Filled 2015-06-05: qty 1

## 2015-06-05 MED ORDER — SULFAMETHOXAZOLE-TRIMETHOPRIM 800-160 MG PO TABS: 1.0000 | ORAL_TABLET | Freq: Two times a day (BID) | ORAL | Status: AC

## 2015-06-05 MED ORDER — HYDROCODONE-ACETAMINOPHEN 5-325 MG PO TABS: 1.0000 | ORAL_TABLET | Freq: Four times a day (QID) | ORAL | Status: DC | PRN

## 2015-06-05 MED ORDER — SULFAMETHOXAZOLE-TRIMETHOPRIM 800-160 MG PO TABS
1.0000 | ORAL_TABLET | Freq: Once | ORAL | Status: AC
  Administered 2015-06-05: 1 via ORAL
  Filled 2015-06-05: qty 1

## 2015-06-05 NOTE — Discharge Instructions (Signed)
Infected Ingrown Toenail °An infected ingrown toenail occurs when the nail edge grows into the skin and bacteria invade the area. Symptoms include pain, tenderness, swelling, and pus drainage from the edge of the nail. Poorly fitting shoes, minor injuries, and improper cutting of the toenail may also contribute to the problem. You should cut your toenails squarely instead of rounding the edges. Do not cut them too short. Avoid tight or pointed toe shoes. Sometimes the ingrown portion of the nail must be removed. If your toenail is removed, it can take 3-4 months for it to re-grow. °HOME CARE INSTRUCTIONS  °· Soak your infected toe in warm water for 20-30 minutes, 2 to 3 times a day. °· Packing or dressings applied to the area should be changed daily. °· Take medicine as directed and finish them. °· Reduce activities and keep your foot elevated when able to reduce swelling and discomfort. Do this until the infection gets better. °· Wear sandals or go barefoot as much as possible while the infected area is sensitive. °· See your caregiver for follow-up care in 2-3 days if the infection is not better. °SEEK MEDICAL CARE IF:  °Your toe is becoming more red, swollen or painful. °MAKE SURE YOU:  °· Understand these instructions. °· Will watch your condition. °· Will get help right away if you are not doing well or get worse. °Document Released: 01/03/2005 Document Revised: 02/18/2012 Document Reviewed: 11/22/2008 °ExitCare® Patient Information ©2015 [employer]. This information is not intended to replace advice given to you by your health care provider. Make sure you discuss any questions you have with your health care provider. ° °

## 2015-06-05 NOTE — ED Provider Notes (Signed)
CSN: [id number]     Arrival date & time 06/05/15  0210 History   First MD Initiated Contact with Patient 06/05/15 0240     Chief Complaint  Patient presents with  . Toe Pain     (Consider location/radiation/quality/duration/timing/severity/associated sxs/prior Treatment) Patient is a 34 y.o. female presenting with toe pain. The history is provided by the patient.  Toe Pain This is a new problem. The current episode started more than 2 days ago. The problem occurs constantly. The problem has not changed since onset.Pertinent negatives include no chest pain. Nothing aggravates the symptoms. Relieved by: pushing on toe. She has tried nothing for the symptoms. The treatment provided no relief.  Dark thick toenail that   Past Medical History  Diagnosis Date  . Migraine    Past Surgical History  Procedure Laterality Date  . Hemmorhoidectomy    . Hernia repair    . Breast surgery     History reviewed. No pertinent family history. History  Substance Use Topics  . Smoking status: Current Every Day Smoker -- 0.50 packs/day    Types: Cigarettes  . Smokeless tobacco: Not on file  . Alcohol Use: No   OB History    No data available     Review of Systems  Cardiovascular: Negative for chest pain.      Allergies  Codeine  Home Medications   Prior to Admission medications   Medication Sig Start Date End Date Taking? Authorizing Provider  Amitriptyline HCl (ELAVIL PO) Take by mouth.    Historical Provider, MD  cyclobenzaprine (FLEXERIL) 10 MG tablet Take 1 tablet (10 mg total) by mouth 2 (two) times daily as needed for muscle spasms. 09/12/13   Hope Bunnie Pion, NP  HYDROcodone-acetaminophen (NORCO) 5-325 MG per tablet Take 1 tablet by mouth every 6 (six) hours as needed. 06/05/15   Talmage Teaster, MD  ibuprofen (ADVIL,MOTRIN) 600 MG tablet Take 1 tablet (600 mg total) by mouth every 6 (six) hours as needed. 07/05/14   Evelina Bucy, MD  polyethylene glycol [facility] / Floria Raveling) packet Take  17 grams daily with 8 ounces of water until bowels are moving well. 05/18/15   John Molpus, MD  sulfamethoxazole-trimethoprim (BACTRIM DS,SEPTRA DS) 800-160 MG per tablet Take 1 tablet by mouth 2 (two) times daily. 06/05/15 06/12/15  Trevell Pariseau, MD   BP 129/86 mmHg  Pulse 82  Temp(Src) 98.6 F (37 C) (Oral)  Resp 18  Ht 5\' 5"  (1.651 m)  Wt 143 lb (64.864 kg)  BMI 23.80 kg/m2  SpO2 100%  LMP 06/05/2015 Physical Exam  Constitutional: She is oriented to person, place, and time. She appears well-developed and well-nourished. No distress.  HENT:  Head: Normocephalic and atraumatic.  Mouth/Throat: Oropharynx is clear and moist.  Eyes: Pupils are equal, round, and reactive to light.  Neck: Normal range of motion.  Cardiovascular: Normal rate, regular rhythm and intact distal pulses.   Pulmonary/Chest: Effort normal and breath sounds normal. She has no wheezes. She has no rales.  Abdominal: Soft. Bowel sounds are normal. There is no tenderness. There is no rebound and no guarding.  Musculoskeletal: Normal range of motion. She exhibits no edema.       Right foot: There is normal range of motion.       Feet:  Neurological: She is alert and oriented to person, place, and time. She has normal reflexes.  Skin: Skin is warm and dry.  Psychiatric: She has a normal mood and affect.    ED Course  NAIL REMOVAL Date/Time: 06/05/2015 4:00 AM Performed by: Veatrice Kells Authorized by: Veatrice Kells Consent: Verbal consent obtained. Risks and benefits: risks, benefits and alternatives were discussed Consent given by: patient Patient identity confirmed: arm band Location: right foot Location details: right big toe Anesthesia: digital block Local anesthetic: lidocaine 1% without epinephrine Anesthetic total: 3 ml Patient sedated: no Preparation: skin prepped with ChloraPrep Amount removed: complete Wedge excision of skin of nail fold: no Nail bed sutured: no Nail matrix removed:  none Removed nail replaced and anchored: yes Dressing: 4x4 and dressing applied Comments: Post op shoe   (including critical care time) Labs Review Labs Reviewed - No data to display  Imaging Review No results found.   EKG Interpretation None      MDM   Final diagnoses:  Tinea pedis of both feet  Toenail fungus  Ingrown toenail  Loose toenail    Medications  pentafluoroprop-tetrafluoroeth (GEBAUERS) aerosol (  Given 06/05/15 0316)  naproxen (NAPROSYN) tablet 500 mg (500 mg Oral Given 06/05/15 0316)  lidocaine (PF) (XYLOCAINE) 1 % injection 30 mL (30 mLs Infiltration Given 06/05/15 0316)  oxyCODONE-acetaminophen (PERCOCET/ROXICET) 5-325 MG per tablet 1 tablet (1 tablet Oral Given 06/05/15 0344)  Tdap (BOOSTRIX) injection 0.5 mL (0.5 mLs Intramuscular Given 06/05/15 0344)  sulfamethoxazole-trimethoprim (BACTRIM DS,SEPTRA DS) 800-160 MG per tablet 1 tablet (1 tablet Oral Given 06/05/15 0349)   Follow up with Dr. Paulla Dolly.  Start tinactin spray for athletes foot   Linnie Delgrande, MD 06/05/15 [phone]
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
## 2015-06-05 NOTE — ED Notes (Signed)
I completed wound care by folding 4x4's between toes, then Kerlix wrap for a bulky dressing, secured with tape. Bariatric sock and post-op shoe fit and completed. Additional supplies given to patient for home care.

## 2015-06-10 ENCOUNTER — Ambulatory Visit (INDEPENDENT_AMBULATORY_CARE_PROVIDER_SITE_OTHER): Payer: Medicaid Other | Admitting: Podiatry

## 2015-06-10 DIAGNOSIS — B351 Tinea unguium: Secondary | ICD-10-CM

## 2015-06-10 NOTE — Progress Notes (Signed)
   Subjective:    Patient ID: Tiffany Christensen, female    DOB: 1981/10/04, 34 y.o.   MRN: [id number]  HPI  I stubbed my toe nail and it broke, then I started smelling something unusual realized it was my toe and went to the ED and they sent me here  This patient presents to my office with painful right big toenail.  She says she stubbed her right toe and roke her toenail.  She says her nail is unattached and now is painful walking.  The ED said she had an infection probably fungal and told her to come to my office for evaluation   Review of Systems  All other systems reviewed and are negative.      Objective:   Physical Exam Objective: Review of past medical history, medications, social history and allergies were performed.  Vascular: Dorsalis pedis and posterior tibial pulses were palpable B/L, capillary refill was  WNL B/L, temperature gradient was WNL B/L   Skin:  No signs of symptoms of infection or ulcers on both feet  Nails: the right great toenail is avulsed distally and pivoting as she walks.  No signs of redness or swelling or drainage. Left hallux toenail is also avulsed but uninjured.  Sensory: Thornell Mule monifilament WNL   Orthopedic: Orthopedic evaluation demonstrates all joints distal t ankle have full ROM without crepitus, muscle power WNL B/L       Assessment & Plan:  Onychomycotic Nail right hallux.  IE.  Removal of right hallux nail plate.  No anesthesia.  Discussed her nail and its future regrowth.

## 2015-07-31 ENCOUNTER — Emergency Department (HOSPITAL_BASED_OUTPATIENT_CLINIC_OR_DEPARTMENT_OTHER)
Admission: EM | Admit: 2015-07-31 | Discharge: 2015-07-31 | Disposition: A | Discharge status: Home / Self Care | Payer: Medicaid Other | Attending: Emergency Medicine | Admitting: Emergency Medicine

## 2015-07-31 ENCOUNTER — Encounter (HOSPITAL_BASED_OUTPATIENT_CLINIC_OR_DEPARTMENT_OTHER): Payer: Self-pay | Admitting: *Deleted

## 2015-07-31 ENCOUNTER — Emergency Department (HOSPITAL_BASED_OUTPATIENT_CLINIC_OR_DEPARTMENT_OTHER): Payer: Medicaid Other

## 2015-07-31 DIAGNOSIS — Z79899 Other long term (current) drug therapy: Secondary | ICD-10-CM | POA: Insufficient documentation

## 2015-07-31 DIAGNOSIS — R0789 Other chest pain: Secondary | ICD-10-CM | POA: Diagnosis not present

## 2015-07-31 DIAGNOSIS — Z8679 Personal history of other diseases of the circulatory system: Secondary | ICD-10-CM | POA: Diagnosis not present

## 2015-07-31 DIAGNOSIS — Z720 Tobacco use: Secondary | ICD-10-CM | POA: Insufficient documentation

## 2015-07-31 DIAGNOSIS — R079 Chest pain, unspecified: Secondary | ICD-10-CM | POA: Diagnosis present

## 2015-07-31 LAB — CBC
HCT: 26.8 % — ABNORMAL LOW (ref 36.0–46.0)
Hemoglobin: 8.7 g/dL — ABNORMAL LOW (ref 12.0–15.0)
MCH: 23.5 pg — ABNORMAL LOW (ref 26.0–34.0)
MCHC: 32.5 g/dL (ref 30.0–36.0)
MCV: 72.2 fL — ABNORMAL LOW (ref 78.0–100.0)
Platelets: 356 10*3/uL (ref 150–400)
RBC: 3.71 MIL/uL — ABNORMAL LOW (ref 3.87–5.11)
RDW: 15.4 % (ref 11.5–15.5)
WBC: 5.4 10*3/uL (ref 4.0–10.5)

## 2015-07-31 LAB — BASIC METABOLIC PANEL WITH GFR
Anion gap: 7 (ref 5–15)
BUN: 7 mg/dL (ref 6–20)
CO2: 22 mmol/L (ref 22–32)
Calcium: 8.9 mg/dL (ref 8.9–10.3)
Chloride: 109 mmol/L (ref 101–111)
Creatinine, Ser: 0.54 mg/dL (ref 0.44–1.00)
GFR calc Af Amer: >60 mL/min (ref >60)
GFR calc non Af Amer: >60 mL/min (ref >60)
Glucose, Bld: 92 mg/dL (ref 65–99)
Potassium: 3.8 mmol/L (ref 3.5–5.1)
Sodium: 138 mmol/L (ref 135–145)

## 2015-07-31 LAB — TROPONIN I: Troponin I: <0.03 ng/mL (ref <0.031)

## 2015-07-31 NOTE — Discharge Instructions (Signed)
Return to the ED with any concerns including difficulty breathing, leg swelling, fainting, decreased level of alertness/lethargy, or any other alarming symptoms

## 2015-07-31 NOTE — ED Provider Notes (Signed)
CSN: [id number]     Arrival date & time 07/31/15  3557 History   First MD Initiated Contact with Patient 07/31/15 [phone]     Chief Complaint  Patient presents with  . Chest Pain     (Consider location/radiation/quality/duration/timing/severity/associated sxs/prior Treatment) HPI  Pt presenting with chest wall soreness, she states pain is present with palpation and with movement.  No shortness of breath.  No significant cough.  No nausea, no diaphoresis, no radiation of pain.  No leg swelling.  No hx of DVT/PE, no recent travel, trauma or surgery.  She states pain has been present over the past 2 days.  She has not had anything for the pain. There are no other associated systemic symptoms, there are no other alleviating or modifying factors.   Past Medical History  Diagnosis Date  . Migraine    Past Surgical History  Procedure Laterality Date  . Hemmorhoidectomy    . Hernia repair    . Breast surgery     No family history on file. Social History  Substance Use Topics  . Smoking status: Current Every Day Smoker -- 0.50 packs/day    Types: Cigarettes  . Smokeless tobacco: None  . Alcohol Use: No   OB History    No data available     Review of Systems  ROS reviewed and all otherwise negative except for mentioned in HPI    Allergies  Codeine  Home Medications   Prior to Admission medications   Medication Sig Start Date End Date Taking? Authorizing Provider  Linaclotide (LINZESS PO) Take by mouth.   Yes Historical Provider, MD  RANITIDINE HCL PO Take by mouth.   Yes Historical Provider, MD  Amitriptyline HCl (ELAVIL PO) Take by mouth.    Historical Provider, MD  cyclobenzaprine (FLEXERIL) 10 MG tablet Take 1 tablet (10 mg total) by mouth 2 (two) times daily as needed for muscle spasms. 09/12/13   Hope Bunnie Pion, NP  HYDROcodone-acetaminophen (NORCO) 5-325 MG per tablet Take 1 tablet by mouth every 6 (six) hours as needed. 06/05/15   April Palumbo, MD  ibuprofen (ADVIL,MOTRIN)  600 MG tablet Take 1 tablet (600 mg total) by mouth every 6 (six) hours as needed. 07/05/14   Evelina Bucy, MD  polyethylene glycol [facility] / Floria Raveling) packet Take 17 grams daily with 8 ounces of water until bowels are moving well. 05/18/15   John Molpus, MD   BP 133/90 mmHg  Pulse 72  Temp(Src) 99.4 F (37.4 C) (Oral)  Resp 18  SpO2 99%  LMP 07/24/2015  Vitals reviewed Physical Exam  Physical Examination: General appearance - alert, well appearing, and in no distress Mental status - alert, oriented to person, place, and time Eyes - no conjunctival injection, no scleral icterus Mouth - mucous membranes moist, pharynx normal without lesions Chest - clear to auscultation, no wheezes, rales or rhonchi, symmetric air entry, ttp over left aspect of sternocostral junction Heart - normal rate, regular rhythm, normal S1, S2, no murmurs, rubs, clicks or gallops Abdomen - soft, nontender, nondistended, no masses or organomegaly Neurological - alert, oriented, normal speech,  Extremities - peripheral pulses normal, no pedal edema, no clubbing or cyanosis Skin - normal coloration and turgor, no rashes  ED Course  Procedures (including critical care time) Labs Review Labs Reviewed  CBC - Abnormal; Notable for the following:    RBC 3.71 (*)    Hemoglobin 8.7 (*)    HCT 26.8 (*)    MCV 72.2 (*)  MCH 23.5 (*)    All other components within normal limits  BASIC METABOLIC PANEL  TROPONIN I    Imaging Review Dg Chest 2 View  07/31/2015   CLINICAL DATA:  Chest pain. Onset of symptoms on Friday. Central chest pain under the LEFT breast. Cough.  EXAM: CHEST  2 VIEW  COMPARISON:  09/12/2013.  FINDINGS: Cardiopericardial silhouette within normal limits. Mediastinal contours normal. Trachea midline. No airspace disease or effusion.  IMPRESSION: No active cardiopulmonary disease.   Electronically Signed   By: Dereck Ligas M.D.   On: 07/31/2015 09:45   I have personally reviewed and evaluated these  images and lab results as part of my medical decision-making.   EKG Interpretation   Date/Time:  Sunday July 31 2015 08:47:08 EDT Ventricular Rate:  80 PR Interval:  114 QRS Duration: 70 QT Interval:  372 QTC Calculation: 429 R Axis:   62 Text Interpretation:  Normal sinus rhythm Normal ECG No significant change  since last tracing Confirmed by [facility]  MD, Yanice Maqueda [phone]) on 07/31/2015  9:36:45 AM      MDM   Final diagnoses:  Chest wall pain    Pt presenting with pain in chest wall, very tender to palpation- most c/w inflmmation/costochondritis.  Pt has heart score of 1, troponin negative and ekg normal, doubt ACS, perc 0 making her very low risk for PE.  Pt advised to take antiinflammatory with food due to her hx of reflux.   Discharged with strict return precautions.  Pt agreeable with plan.    Alfonzo Beers, MD 07/31/15 [phone]

## 2015-07-31 NOTE — ED Notes (Addendum)
Patient c/o mid chest pain that has been going on for the past two days, tender to touch, hurts more  when she moves, no sob, no meds taken. Patient states that she is going to have an endoscopy tomorrow

## 2015-08-16 ENCOUNTER — Emergency Department (HOSPITAL_BASED_OUTPATIENT_CLINIC_OR_DEPARTMENT_OTHER)
Admission: EM | Admit: 2015-08-16 | Discharge: 2015-08-17 | Disposition: A | Discharge status: Home / Self Care | Payer: Medicaid Other | Attending: Emergency Medicine | Admitting: Emergency Medicine

## 2015-08-16 ENCOUNTER — Encounter (HOSPITAL_BASED_OUTPATIENT_CLINIC_OR_DEPARTMENT_OTHER): Payer: Self-pay | Admitting: Emergency Medicine

## 2015-08-16 DIAGNOSIS — Z792 Long term (current) use of antibiotics: Secondary | ICD-10-CM | POA: Diagnosis not present

## 2015-08-16 DIAGNOSIS — Z720 Tobacco use: Secondary | ICD-10-CM | POA: Diagnosis not present

## 2015-08-16 DIAGNOSIS — T50995A Adverse effect of other drugs, medicaments and biological substances, initial encounter: Secondary | ICD-10-CM | POA: Insufficient documentation

## 2015-08-16 DIAGNOSIS — R112 Nausea with vomiting, unspecified: Secondary | ICD-10-CM | POA: Diagnosis not present

## 2015-08-16 DIAGNOSIS — G43909 Migraine, unspecified, not intractable, without status migrainosus: Secondary | ICD-10-CM | POA: Diagnosis not present

## 2015-08-16 DIAGNOSIS — T50905A Adverse effect of unspecified drugs, medicaments and biological substances, initial encounter: Secondary | ICD-10-CM

## 2015-08-16 NOTE — ED Notes (Signed)
Patient states that she went to pick up her medications today and was given one of the wrong medications and she has already taken it. She took deliresp 500 mg. Since she took it and now is having vomiting and headache.

## 2015-08-17 MED ORDER — PROMETHAZINE HCL 25 MG PO TABS: 25.0000 mg | ORAL_TABLET | Freq: Four times a day (QID) | ORAL | Status: DC | PRN

## 2015-08-17 MED ORDER — ACETAMINOPHEN 325 MG PO TABS
650.0000 mg | ORAL_TABLET | Freq: Once | ORAL | Status: AC
  Administered 2015-08-17: 650 mg via ORAL
  Filled 2015-08-17: qty 2

## 2015-08-17 MED ORDER — PROMETHAZINE HCL 25 MG/ML IJ SOLN
25.0000 mg | Freq: Once | INTRAMUSCULAR | Status: AC
  Administered 2015-08-17: 25 mg via INTRAMUSCULAR
  Filled 2015-08-17: qty 1

## 2015-08-17 NOTE — ED Provider Notes (Signed)
CSN: [id number]     Arrival date & time 08/16/15  2300 History   First MD Initiated Contact with Patient 08/17/15 0118     Chief Complaint  Patient presents with  . 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 She was prescribed amoxicillin, clarithromycin, sucralfate, and omeprazole. She was inadvertently given a bottle of Daliresp 500mg  by her pharmacy. She took one of these at 3 PM yesterday afternoon and subsequently developed nausea, vomiting and a headache. Symptoms are moderate. She has not taken anything for these symptoms. Her nausea and headache persist. She has returned the Daliresp to the pharmacy.  The patient states she also has uterine fibroids which is adding to the pain she already has from her ulcer.  Past Medical History  Diagnosis Date  . Migraine    Past Surgical History  Procedure Laterality Date  . Hemmorhoidectomy    . Hernia repair    . Breast surgery     History reviewed. No pertinent family history. Social History  Substance Use Topics  . Smoking status: Current Every Day Smoker -- 0.50 packs/day    Types: Cigarettes  . Smokeless tobacco: None  . Alcohol Use: No   OB History    No data available     Review of Systems  All other systems reviewed and are negative.   Allergies  Codeine  Home Medications   Prior to Admission medications   Medication Sig Start Date End Date Taking? Authorizing Provider  clarithromycin (BIAXIN) 500 MG tablet Take 500 mg by mouth 2 (two) times daily.   Yes Historical Provider, MD  Amitriptyline HCl (ELAVIL PO) Take by mouth.    Historical Provider, MD  cyclobenzaprine (FLEXERIL) 10 MG tablet Take 1 tablet (10 mg total) by mouth 2 (two) times daily as needed for muscle spasms. 09/12/13   Hope Bunnie Pion, NP  HYDROcodone-acetaminophen (NORCO)  5-325 MG per tablet Take 1 tablet by mouth every 6 (six) hours as needed. 06/05/15   April Palumbo, MD  ibuprofen (ADVIL,MOTRIN) 600 MG tablet Take 1 tablet (600 mg total) by mouth every 6 (six) hours as needed. 07/05/14   Evelina Bucy, MD  Linaclotide (LINZESS PO) Take by mouth.    Historical Provider, MD  polyethylene glycol (MIRALAX / GLYCOLAX) packet Take 17 grams daily with 8 ounces of water until bowels are moving well. 05/18/15   Bruna Dills, MD  RANITIDINE HCL PO Take by mouth.    Historical Provider, MD   BP 126/85 mmHg  Pulse 91  Temp(Src) 98.3 F (36.8 C) (Oral)  Resp 16  Wt 140 lb (63.504 kg)  SpO2 100%  LMP 07/24/2015   Physical Exam  General: Well-developed, well-nourished female in no acute distress; appearance consistent with age of record HENT: normocephalic; atraumatic Eyes: pupils equal, round and reactive to light; extraocular muscles intact Neck: supple Heart: regular rate and rhythm Lungs: clear to auscultation bilaterally Abdomen: soft; nondistended; diffusely tender; bowel sounds present Extremities: No deformity; full range of motion; pulses normal Neurologic: Awake, alert and oriented; motor function intact in all extremities and symmetric; no facial droop Skin: Warm and dry Psychiatric: Normal mood and affect    ED Course  Procedures (including critical care time)   MDM  2:40 AM Patient feeling better after Phenergan and acetaminophen.  Shanon Rosser, MD 08/17/15 [phone]

## 2015-08-17 NOTE — ED Notes (Signed)
MD at bedside. 

## 2016-06-30 ENCOUNTER — Emergency Department (HOSPITAL_BASED_OUTPATIENT_CLINIC_OR_DEPARTMENT_OTHER): Payer: Medicaid Other

## 2016-06-30 ENCOUNTER — Encounter (HOSPITAL_BASED_OUTPATIENT_CLINIC_OR_DEPARTMENT_OTHER): Payer: Self-pay | Admitting: *Deleted

## 2016-06-30 ENCOUNTER — Emergency Department (HOSPITAL_BASED_OUTPATIENT_CLINIC_OR_DEPARTMENT_OTHER)
Admission: EM | Admit: 2016-06-30 | Discharge: 2016-06-30 | Disposition: A | Discharge status: Home / Self Care | Payer: Medicaid Other | Attending: Emergency Medicine | Admitting: Emergency Medicine

## 2016-06-30 DIAGNOSIS — F17210 Nicotine dependence, cigarettes, uncomplicated: Secondary | ICD-10-CM | POA: Insufficient documentation

## 2016-06-30 DIAGNOSIS — K2100 Gastro-esophageal reflux disease with esophagitis, without bleeding: Secondary | ICD-10-CM

## 2016-06-30 MED ORDER — RANITIDINE HCL 150 MG PO TABS: 150.0000 mg | ORAL_TABLET | Freq: Two times a day (BID) | ORAL | Status: DC

## 2016-06-30 NOTE — ED Notes (Signed)
Pt reports "when I eat I gag" x 1 month. Pt also c/o intermittent chest pains only with eating x 1 month.

## 2016-06-30 NOTE — ED Notes (Signed)
Pt reports h/o stomach ulcers and has had multiple endoscopies performed, most recently approx 2 months ago.

## 2016-06-30 NOTE — ED Notes (Signed)
Dr Audie Pinto in room with pt now.

## 2016-06-30 NOTE — ED Provider Notes (Signed)
CSN: [id number]     Arrival date & time 06/30/16  1330 History   First MD Initiated Contact with Patient 06/30/16 1343     Chief Complaint  Patient presents with  . Dysphagia      HPI Pt reports "when I eat I gag" x 1 month. Pt also c/o intermittent chest pains only with eating x 1 month.  Patient has had a sensation of liquid coming up in her throat when she is asleep at night.  .Patient had an EGD done approximately 2 months ago which was negative for ulcers. Past Medical History  Diagnosis Date  . Migraine    Past Surgical History  Procedure Laterality Date  . Hemmorhoidectomy    . Hernia repair    . Breast surgery     History reviewed. No pertinent family history. Social History  Substance Use Topics  . Smoking status: Current Every Day Smoker -- 0.50 packs/day    Types: Cigarettes  . Smokeless tobacco: None  . Alcohol Use: No   OB History    No data available     Review of Systems  All other systems reviewed and are negative  Allergies  Codeine  Home Medications   Prior to Admission medications   Medication Sig Start Date End Date Taking? Authorizing Provider  Amitriptyline HCl (ELAVIL PO) Take by mouth.    Historical Provider, MD  clarithromycin (BIAXIN) 500 MG tablet Take 500 mg by mouth 2 (two) times daily.    Historical Provider, MD  cyclobenzaprine (FLEXERIL) 10 MG tablet Take 1 tablet (10 mg total) by mouth 2 (two) times daily as needed for muscle spasms. 09/12/13   Hope Bunnie Pion, NP  HYDROcodone-acetaminophen (NORCO) 5-325 MG per tablet Take 1 tablet by mouth every 6 (six) hours as needed. 06/05/15   April Palumbo, MD  ibuprofen (ADVIL,MOTRIN) 600 MG tablet Take 1 tablet (600 mg total) by mouth every 6 (six) hours as needed. 07/05/14   Evelina Bucy, MD  Linaclotide (LINZESS PO) Take by mouth.    Historical Provider, MD  polyethylene glycol (MIRALAX / GLYCOLAX) packet Take 17 grams daily with 8 ounces of water until bowels are moving well. 05/18/15   John  Molpus, MD  promethazine (PHENERGAN) 25 MG tablet Take 1 tablet (25 mg total) by mouth every 6 (six) hours as needed for nausea or vomiting. 08/17/15   John Molpus, MD  ranitidine (ZANTAC) 150 MG tablet Take 1 tablet (150 mg total) by mouth 2 (two) times daily. 06/30/16   Leonard Schwartz, MD   BP 119/76 mmHg  Pulse 86  Temp(Src) 99.2 F (37.3 C) (Oral)  Resp 18  Ht 5\' 5"  (1.651 m)  Wt 140 lb (63.504 kg)  BMI 23.30 kg/m2  SpO2 100%  LMP 06/21/2016 Physical Exam Physical Exam  Nursing note and vitals reviewed. Constitutional: She is oriented to person, place, and time. She appears well-developed and well-nourished. No distress.  HENT:  Head: Normocephalic and atraumatic. Mouth: Oropharynx appears normal with no masses.   Eyes: Pupils are equal, round, and reactive to light.  Neck: Normal range of motion.  No difficulty swallowing.  No adenopathy noted.  No masses noted palpation.  Thyroid does not appear palpable.   Cardiovascular: Normal rate and intact distal pulses.   Pulmonary/Chest: No respiratory distress.  Abdominal: Normal appearance. She exhibits no distension.  Musculoskeletal: Normal range of motion.  Neurological: She is alert and oriented to person, place, and time. No cranial nerve deficit.  Skin: Skin is warm  and dry. No rash noted.  Psychiatric: She has a normal mood and affect. Her behavior is normal.   ED Course  Procedures (including critical care time) Labs Review Labs Reviewed - No data to display  Imaging Review Dg Chest 2 View  06/30/2016  CLINICAL DATA:  Difficulty swallowing, nausea, shortness of breath, sternal chest pain for 1 month. EXAM: CHEST  2 VIEW COMPARISON:  None. FINDINGS: Heart size is normal. Overall cardiomediastinal silhouette appears normal in size and configuration. Lungs are clear. No pleural effusion or pneumothorax seen. Osseous and soft tissue structures about the chest are unremarkable. IMPRESSION: No active cardiopulmonary disease.  Electronically Signed   By: Franki Cabot M.D.   On: 06/30/2016 14:25   I have personally reviewed and evaluated these images and lab results as part of my medical decision-making.    MDM   Final diagnoses:  Reflux esophagitis        Leonard Schwartz, MD 06/30/16 1441

## 2016-06-30 NOTE — Discharge Instructions (Signed)
Gastroesophageal Reflux Disease, Adult Normally, food travels down the esophagus and stays in the stomach to be digested. However, when a person has gastroesophageal reflux disease (GERD), food and stomach acid move back up into the esophagus. When this happens, the esophagus becomes sore and inflamed. Over time, GERD can create small holes (ulcers) in the lining of the esophagus.  CAUSES This condition is caused by a problem with the muscle between the esophagus and the stomach (lower esophageal sphincter, or LES). Normally, the LES muscle closes after food passes through the esophagus to the stomach. When the LES is weakened or abnormal, it does not close properly, and that allows food and stomach acid to go back up into the esophagus. The LES can be weakened by certain dietary substances, medicines, and medical conditions, including:  Tobacco use.  Pregnancy.  Having a hiatal hernia.  Heavy alcohol use.  Certain foods and beverages, such as coffee, chocolate, onions, and peppermint. RISK FACTORS This condition is more likely to develop in:  People who have an increased body weight.  People who have connective tissue disorders.  People who use NSAID medicines. SYMPTOMS Symptoms of this condition include:  Heartburn.  Difficult or painful swallowing.  The feeling of having a lump in the throat.  Abitter taste in the mouth.  Bad breath.  Having a large amount of saliva.  Having an upset or bloated stomach.  Belching.  Chest pain.  Shortness of breath or wheezing.  Ongoing (chronic) cough or a night-time cough.  Wearing away of tooth enamel.  Weight loss. Different conditions can cause chest pain. Make sure to see your health care provider if you experience chest pain. DIAGNOSIS Your health care provider will take a medical history and perform a physical exam. To determine if you have mild or severe GERD, your health care provider may also monitor how you respond  to treatment. You may also have other tests, including:  An endoscopy toexamine your stomach and esophagus with a small camera.  A test thatmeasures the acidity level in your esophagus.  A test thatmeasures how much pressure is on your esophagus.  A barium swallow or modified barium swallow to show the shape, size, and functioning of your esophagus. TREATMENT The goal of treatment is to help relieve your symptoms and to prevent complications. Treatment for this condition may vary depending on how severe your symptoms are. Your health care provider may recommend:  Changes to your diet.  Medicine.  Surgery. HOME CARE INSTRUCTIONS Diet  Follow a diet as recommended by your health care provider. This may involve avoiding foods and drinks such as:  Coffee and tea (with or without caffeine).  Drinks that containalcohol.  Energy drinks and sports drinks.  Carbonated drinks or sodas.  Chocolate and cocoa.  Peppermint and mint flavorings.  Garlic and onions.  Horseradish.  Spicy and acidic foods, including peppers, chili powder, curry powder, vinegar, hot sauces, and barbecue sauce.  Citrus fruit juices and citrus fruits, such as oranges, lemons, and limes.  Tomato-based foods, such as red sauce, chili, salsa, and pizza with red sauce.  Fried and fatty foods, such as donuts, french fries, potato chips, and high-fat dressings.  High-fat meats, such as hot dogs and fatty cuts of red and white meats, such as rib eye steak, sausage, ham, and bacon.  High-fat dairy items, such as whole milk, butter, and cream cheese.  Eat small, frequent meals instead of large meals.  Avoid drinking large amounts of liquid with your  meals.  Avoid eating meals during the 2-3 hours before bedtime.  Avoid lying down right after you eat.  Do not exercise right after you eat. General Instructions  Pay attention to any changes in your symptoms.  Take over-the-counter and prescription  medicines only as told by your health care provider. Do not take aspirin, ibuprofen, or other NSAIDs unless your health care provider told you to do so.  Do not use any tobacco products, including cigarettes, chewing tobacco, and e-cigarettes. If you need help quitting, ask your health care provider.  Wear loose-fitting clothing. Do not wear anything tight around your waist that causes pressure on your abdomen.  Raise (elevate) the head of your bed 6 inches (15cm).  Try to reduce your stress, such as with yoga or meditation. If you need help reducing stress, ask your health care provider.  If you are overweight, reduce your weight to an amount that is healthy for you. Ask your health care provider for guidance about a safe weight loss goal.  Keep all follow-up visits as told by your health care provider. This is important. SEEK MEDICAL CARE IF:  You have new symptoms.  You have unexplained weight loss.  You have difficulty swallowing, or it hurts to swallow.  You have wheezing or a persistent cough.  Your symptoms do not improve with treatment.  You have a hoarse voice. SEEK IMMEDIATE MEDICAL CARE IF:  You have pain in your arms, neck, jaw, teeth, or back.  You feel sweaty, dizzy, or light-headed.  You have chest pain or shortness of breath.  You vomit and your vomit looks like blood or coffee grounds.  You faint.  Your stool is bloody or black.  You cannot swallow, drink, or eat.   This information is not intended to replace advice given to you by your health care provider. Make sure you discuss any questions you have with your health care provider.   Document Released: 09/05/2005 Document Revised: 08/17/2015 Document Reviewed: 03/23/2015 Elsevier Interactive Patient Education 2016 [place] for Gastroesophageal Reflux Disease, Adult When you have gastroesophageal reflux disease (GERD), the foods you eat and your eating habits are very important.  Choosing the right foods can help ease the discomfort of GERD. WHAT GENERAL GUIDELINES DO I NEED TO FOLLOW?  Choose fruits, vegetables, whole grains, low-fat dairy products, and low-fat meat, fish, and poultry.  Limit fats such as oils, salad dressings, butter, nuts, and avocado.  Keep a food diary to identify foods that cause symptoms.  Avoid foods that cause reflux. These may be different for different people.  Eat frequent small meals instead of three large meals each day.  Eat your meals slowly, in a relaxed setting.  Limit fried foods.  Cook foods using methods other than frying.  Avoid drinking alcohol.  Avoid drinking large amounts of liquids with your meals.  Avoid bending over or lying down until 2-3 hours after eating. WHAT FOODS ARE NOT RECOMMENDED? The following are some foods and drinks that may worsen your symptoms: Vegetables Tomatoes. Tomato juice. Tomato and spaghetti sauce. Chili peppers. Onion and garlic. Horseradish. Fruits Oranges, grapefruit, and lemon (fruit and juice). Meats High-fat meats, fish, and poultry. This includes hot dogs, ribs, ham, sausage, salami, and bacon. Dairy Whole milk and chocolate milk. Sour cream. Cream. Butter. Ice cream. Cream cheese.  Beverages Coffee and tea, with or without caffeine. Carbonated beverages or energy drinks. Condiments Hot sauce. Barbecue sauce.  Sweets/Desserts Chocolate and cocoa. Donuts. Peppermint and  spearmint. Fats and Oils High-fat foods, including [place] fries and potato chips. Other Vinegar. Strong spices, such as black pepper, white pepper, red pepper, cayenne, curry powder, cloves, ginger, and chili powder. The items listed above may not be a complete list of foods and beverages to avoid. Contact your dietitian for more information.   This information is not intended to replace advice given to you by your health care provider. Make sure you discuss any questions you have with your health care  provider.   Document Released: 11/26/2005 Document Revised: 12/17/2014 Document Reviewed: 09/30/2013 Elsevier Interactive Patient Education [employer].

## 2016-07-14 ENCOUNTER — Emergency Department (HOSPITAL_BASED_OUTPATIENT_CLINIC_OR_DEPARTMENT_OTHER)
Admission: EM | Admit: 2016-07-14 | Discharge: 2016-07-15 | Disposition: A | Discharge status: Home / Self Care | Payer: No Typology Code available for payment source | Attending: Emergency Medicine | Admitting: Emergency Medicine

## 2016-07-14 ENCOUNTER — Encounter (HOSPITAL_BASED_OUTPATIENT_CLINIC_OR_DEPARTMENT_OTHER): Payer: Self-pay | Admitting: *Deleted

## 2016-07-14 DIAGNOSIS — M542 Cervicalgia: Secondary | ICD-10-CM

## 2016-07-14 DIAGNOSIS — Y9389 Activity, other specified: Secondary | ICD-10-CM | POA: Insufficient documentation

## 2016-07-14 DIAGNOSIS — S43102A Unspecified dislocation of left acromioclavicular joint, initial encounter: Secondary | ICD-10-CM | POA: Diagnosis not present

## 2016-07-14 DIAGNOSIS — F17210 Nicotine dependence, cigarettes, uncomplicated: Secondary | ICD-10-CM | POA: Insufficient documentation

## 2016-07-14 DIAGNOSIS — Y92410 Unspecified street and highway as the place of occurrence of the external cause: Secondary | ICD-10-CM | POA: Diagnosis not present

## 2016-07-14 DIAGNOSIS — S4992XA Unspecified injury of left shoulder and upper arm, initial encounter: Secondary | ICD-10-CM | POA: Diagnosis present

## 2016-07-14 DIAGNOSIS — M25512 Pain in left shoulder: Secondary | ICD-10-CM

## 2016-07-14 DIAGNOSIS — Y999 Unspecified external cause status: Secondary | ICD-10-CM | POA: Diagnosis not present

## 2016-07-14 LAB — PREGNANCY, URINE: Preg Test, Ur: NEGATIVE
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
## 2016-07-15 ENCOUNTER — Emergency Department (HOSPITAL_BASED_OUTPATIENT_CLINIC_OR_DEPARTMENT_OTHER): Payer: No Typology Code available for payment source

## 2016-07-15 MED ORDER — METHOCARBAMOL 500 MG PO TABS: 500.0000 mg | ORAL_TABLET | Freq: Two times a day (BID) | ORAL | 0 refills | Status: DC

## 2016-07-15 MED ORDER — NAPROXEN 500 MG PO TABS: 500.0000 mg | ORAL_TABLET | Freq: Two times a day (BID) | ORAL | 0 refills | Status: DC

## 2016-07-15 MED ORDER — NAPROXEN 250 MG PO TABS
500.0000 mg | ORAL_TABLET | Freq: Once | ORAL | Status: AC
  Administered 2016-07-15: 500 mg via ORAL
  Filled 2016-07-15: qty 2

## 2016-08-23 ENCOUNTER — Emergency Department (HOSPITAL_BASED_OUTPATIENT_CLINIC_OR_DEPARTMENT_OTHER)
Admission: EM | Admit: 2016-08-23 | Discharge: 2016-08-23 | Disposition: A | Discharge status: Home / Self Care | Payer: Self-pay | Attending: Emergency Medicine | Admitting: Emergency Medicine

## 2016-08-23 ENCOUNTER — Emergency Department (HOSPITAL_BASED_OUTPATIENT_CLINIC_OR_DEPARTMENT_OTHER): Payer: Self-pay

## 2016-08-23 ENCOUNTER — Encounter (HOSPITAL_BASED_OUTPATIENT_CLINIC_OR_DEPARTMENT_OTHER): Payer: Self-pay

## 2016-08-23 DIAGNOSIS — M79672 Pain in left foot: Secondary | ICD-10-CM | POA: Insufficient documentation

## 2016-08-23 DIAGNOSIS — F17210 Nicotine dependence, cigarettes, uncomplicated: Secondary | ICD-10-CM | POA: Insufficient documentation

## 2016-08-23 MED ORDER — IBUPROFEN 600 MG PO TABS: 600.0000 mg | ORAL_TABLET | Freq: Four times a day (QID) | ORAL | 0 refills | Status: DC | PRN

## 2016-08-23 MED ORDER — IBUPROFEN 800 MG PO TABS
800.0000 mg | ORAL_TABLET | Freq: Once | ORAL | Status: AC
  Administered 2016-08-23: 800 mg via ORAL

## 2016-08-23 MED ORDER — IBUPROFEN 800 MG PO TABS
ORAL_TABLET | ORAL | Status: AC
  Filled 2016-08-23: qty 1

## 2016-08-23 NOTE — ED Notes (Signed)
np at bedside

## 2016-08-23 NOTE — ED Triage Notes (Addendum)
C/o pain to top of left foot-started while standing at work approx 2p-denies injury-presents to triage in w/c-does not [place] cane or walker-NAD

## 2016-08-23 NOTE — ED Provider Notes (Signed)
[place] [facility] Provider Note   CSN: [id number] Arrival date & time: 08/23/16  1717  By signing my name below, I, Emmanuella Mensah, attest that this documentation has been prepared under the direction and in the presence of Etta Quill, NP has Electronically Signed: Judithann Sauger, ED Scribe. 08/23/16. 6:37 PM.    History   Chief Complaint Chief Complaint  Patient presents with  . Foot Pain    HPI Comments: Tiffany Christensen is a 35 y.o. female who presents to the Emergency Department complaining of sudden onset of gradually worsening moderate pain to the dorsum of left foot with swelling onset 2 pm today. She notes that the pain is radiating up her left leg. She reports associated difficulty ambulating secondary to pain. She explains that she has been having intermittent tingling in her left leg for 2 weeks. No alleviating factors noted. Pt has not tried any medications PTA. She denies any recent falls, injuries, or trauma. She explains that she works by standing on her feet for several hours. She denies any hx of DM. She also denies any fever, chills, generalized rash, or any open wounds.   No PCP   The history is provided by the patient. No language interpreter was used.  Foot Pain  This is a new problem. The current episode started 3 to 5 hours ago. The problem has been gradually worsening. Nothing aggravates the symptoms. Nothing relieves the symptoms. She has tried nothing for the symptoms.    Past Medical History:  Diagnosis Date  . Migraine     There are no active problems to display for this patient.   Past Surgical History:  Procedure Laterality Date  . BREAST SURGERY    . hemmorhoidectomy    . HEMORRHOID SURGERY    . HERNIA REPAIR      OB History    No data available       Home Medications    Prior to Admission medications   Medication Sig Start Date End Date Taking? Authorizing Provider  Amitriptyline HCl (ELAVIL PO) Take by mouth.     Historical Provider, MD  clarithromycin (BIAXIN) 500 MG tablet Take 500 mg by mouth 2 (two) times daily.    Historical Provider, MD  cyclobenzaprine (FLEXERIL) 10 MG tablet Take 1 tablet (10 mg total) by mouth 2 (two) times daily as needed for muscle spasms. 09/12/13   Hope Bunnie Pion, NP  HYDROcodone-acetaminophen (NORCO) 5-325 MG per tablet Take 1 tablet by mouth every 6 (six) hours as needed. 06/05/15   April Palumbo, MD  ibuprofen (ADVIL,MOTRIN) 600 MG tablet Take 1 tablet (600 mg total) by mouth every 6 (six) hours as needed. 07/05/14   Evelina Bucy, MD  Linaclotide (LINZESS PO) Take by mouth.    Historical Provider, MD  methocarbamol (ROBAXIN) 500 MG tablet Take 1 tablet (500 mg total) by mouth 2 (two) times daily. 07/15/16   April Palumbo, MD  naproxen (NAPROSYN) 500 MG tablet Take 1 tablet (500 mg total) by mouth 2 (two) times daily. 07/15/16   April Palumbo, MD  polyethylene glycol [facility] / GLYCOLAX) packet Take 17 grams daily with 8 ounces of water until bowels are moving well. 05/18/15   John Molpus, MD  promethazine (PHENERGAN) 25 MG tablet Take 1 tablet (25 mg total) by mouth every 6 (six) hours as needed for nausea or vomiting. 08/17/15   John Molpus, MD  ranitidine (ZANTAC) 150 MG tablet Take 1 tablet (150 mg total) by mouth 2 (two) times daily.  06/30/16   Leonard Schwartz, MD    Family History No family history on file.  Social History Social History  Substance Use Topics  . Smoking status: Current Every Day Smoker    Packs/day: 0.50    Types: Cigarettes  . Smokeless tobacco: Never Used  . Alcohol use No     Allergies   Codeine   Review of Systems Review of Systems  Constitutional: Negative for chills and fever.  Musculoskeletal: Positive for arthralgias and joint swelling.  Skin: Negative for rash and wound.  All other systems reviewed and are negative.    Physical Exam Updated Vital Signs BP 119/77 (BP Location: Right Arm)   Pulse 91   Temp 98.7 F (37.1 C) (Oral)    Resp 20   Ht 5\' 5"  (1.651 m)   Wt 138 lb (62.6 kg)   LMP 08/16/2016   SpO2 100%   BMI 22.96 kg/m   Physical Exam  Constitutional: She is oriented to person, place, and time. She appears well-developed and well-nourished. No distress.  HENT:  Head: Normocephalic and atraumatic.  Eyes: Conjunctivae and EOM are normal.  Neck: Neck supple. No tracheal deviation present.  Cardiovascular: Normal rate.   Pulmonary/Chest: Effort normal. No respiratory distress.  Musculoskeletal: Normal range of motion. She exhibits tenderness.  Tenderness across the metatarsals of left foot with swelling. No increased warmth   Neurological: She is alert and oriented to person, place, and time.  Skin: Skin is warm and dry.  Psychiatric: She has a normal mood and affect. Her behavior is normal.  Nursing note and vitals reviewed.    ED Treatments / Results  DIAGNOSTIC STUDIES: Oxygen Saturation is 100% on RA, normal by my interpretation.    COORDINATION OF CARE: 6:33 PM- Pt advised of plan for treatment and pt agrees. Pt will receive left foot x-ray for further evaluation.   Radiology Dg Foot Complete Left  Result Date: 08/23/2016 CLINICAL DATA:  Left foot pain and swelling. Acute onset today while standing. Redness and bruising radiating up left lower extremity. No known injury. EXAM: LEFT FOOT - COMPLETE 3+ VIEW COMPARISON:  None. FINDINGS: There is no evidence of fracture or dislocation. There is no evidence of arthropathy or other focal bone abnormality. Tiny plantar calcaneal spur. Focal soft tissue edema over the dorsum of the midfoot. IMPRESSION: No acute osseous abnormality. Dorsal soft tissue edema over the midfoot. Electronically Signed   By: Jeb Levering M.D.   On: 08/23/2016 20:03    Procedures Procedures (including critical care time)  Medications Ordered in ED Medications - No data to display   Initial Impression / Assessment and Plan / ED Course  Etta Quill, NP has reviewed the  triage vital signs and the nursing notes.  Pertinent labs & imaging results that were available during my care of the patient were reviewed by me and considered in my medical decision making (see chart for details).  Clinical Course   Patient X-Ray negative for obvious fracture or dislocation.  Pt advised to follow up with orthopedics. Patient given ace wrap, crutches while in ED, conservative therapy recommended and discussed. Patient will be discharged home & is agreeable with above plan. Returns precautions discussed. Pt appears safe for discharge.   Final Clinical Impressions(s) / ED Diagnoses   Final diagnoses:  None  Left foot pain.  New Prescriptions New Prescriptions   No medications on file   I personally performed the services described in this documentation, which was scribed in my presence.  The recorded information has been reviewed and is accurate.    Etta Quill, NP 08/24/16 [id number]    Fredia Sorrow, MD 08/25/16 [phone]

## 2016-10-23 ENCOUNTER — Emergency Department (HOSPITAL_BASED_OUTPATIENT_CLINIC_OR_DEPARTMENT_OTHER)
Admission: EM | Admit: 2016-10-23 | Discharge: 2016-10-23 | Disposition: A | Discharge status: Home / Self Care | Payer: Medicaid Other | Attending: Emergency Medicine | Admitting: Emergency Medicine

## 2016-10-23 ENCOUNTER — Encounter (HOSPITAL_BASED_OUTPATIENT_CLINIC_OR_DEPARTMENT_OTHER): Payer: Self-pay

## 2016-10-23 DIAGNOSIS — B9689 Other specified bacterial agents as the cause of diseases classified elsewhere: Secondary | ICD-10-CM

## 2016-10-23 DIAGNOSIS — F17210 Nicotine dependence, cigarettes, uncomplicated: Secondary | ICD-10-CM | POA: Insufficient documentation

## 2016-10-23 DIAGNOSIS — N760 Acute vaginitis: Secondary | ICD-10-CM

## 2016-10-23 DIAGNOSIS — M5432 Sciatica, left side: Secondary | ICD-10-CM | POA: Insufficient documentation

## 2016-10-23 DIAGNOSIS — Z791 Long term (current) use of non-steroidal anti-inflammatories (NSAID): Secondary | ICD-10-CM | POA: Insufficient documentation

## 2016-10-23 DIAGNOSIS — Z79899 Other long term (current) drug therapy: Secondary | ICD-10-CM | POA: Insufficient documentation

## 2016-10-23 DIAGNOSIS — A5901 Trichomonal vulvovaginitis: Secondary | ICD-10-CM

## 2016-10-23 LAB — WET PREP, GENITAL: Sperm: NONE SEEN

## 2016-10-23 LAB — URINALYSIS, ROUTINE W REFLEX MICROSCOPIC
Bilirubin Urine: NEGATIVE
Glucose, UA: NEGATIVE mg/dL
Ketones, ur: NEGATIVE mg/dL
Nitrite: NEGATIVE
Protein, ur: NEGATIVE mg/dL
Specific Gravity, Urine: 1.016 (ref 1.005–1.030)
pH: 5.5 (ref 5.0–8.0)

## 2016-10-23 LAB — PREGNANCY, URINE: Preg Test, Ur: NEGATIVE

## 2016-10-23 LAB — URINE MICROSCOPIC-ADD ON

## 2016-10-23 MED ORDER — METRONIDAZOLE 500 MG PO TABS: 500.0000 mg | ORAL_TABLET | Freq: Two times a day (BID) | ORAL | 0 refills | Status: DC

## 2016-10-23 MED ORDER — HYDROCODONE-ACETAMINOPHEN 5-325 MG PO TABS: 1.0000 | ORAL_TABLET | Freq: Four times a day (QID) | ORAL | 0 refills | Status: DC | PRN

## 2016-10-23 MED ORDER — METRONIDAZOLE 500 MG PO TABS
500.0000 mg | ORAL_TABLET | Freq: Once | ORAL | Status: AC
  Administered 2016-10-23: 500 mg via ORAL
  Filled 2016-10-23: qty 1

## 2016-10-23 MED ORDER — KETOROLAC TROMETHAMINE 60 MG/2ML IM SOLN
60.0000 mg | Freq: Once | INTRAMUSCULAR | Status: AC
  Administered 2016-10-23: 60 mg via INTRAMUSCULAR
  Filled 2016-10-23: qty 2

## 2016-10-23 MED ORDER — PREDNISONE 20 MG PO TABS: ORAL_TABLET | ORAL | 0 refills | Status: DC

## 2016-10-23 NOTE — ED Triage Notes (Signed)
Pt c/o lt numbness from lt hip/leg/knee/foot x3 days; pt also c/o lt lower abdominal pain since Thursday after menstral cycle

## 2016-10-23 NOTE — ED Notes (Signed)
Pt amb to BR w/o assist ans w/o difficulty

## 2016-10-23 NOTE — ED Notes (Signed)
ED Provider at bedside. 

## 2016-10-23 NOTE — ED Provider Notes (Signed)
[place] [facility] Provider Note   CSN: [id number] Arrival date & time: 10/23/16  2005  By signing my name below, I, Irene Pap, attest that this documentation has been prepared under the direction and in the presence of Malvin Johns, MD. Electronically Signed: Irene Pap, ED Scribe. 10/23/16. 11:05 PM.  History   Chief Complaint Chief Complaint  Patient presents with  . Numbness    left leg to foot   The history is provided by the patient. No language interpreter was used.   HPI Comments: Tiffany Christensen is a 35 y.o. female who presents to the Emergency Department complaining of intermittentPain that starts in her left posterior hip and radiates down her leg. She states it's associated with numbness in her leg. She denies any weakness in the leg. She states the pain is intermittent and usually associated with lying on it or sitting on it. Pt says that the numbness is worse when she is asleep. The pain will wake her from sleep and she will be unable to move. She says that it also occurs when she sits. She also notes cramping LLQ abdominal pain that began on 10/18/16, following the end of her menstrual cycle. Pt says that she has a hx of gallstones and fibroids. She reports associated nausea and vomiting. She has not taken anything for her pain. She denies recent fall or injury, vaginal bleeding, vaginal discharge, diarrhea, or back pain.  Past Medical History:  Diagnosis Date  . Migraine     There are no active problems to display for this patient.  Past Surgical History:  Procedure Laterality Date  . BREAST SURGERY    . hemmorhoidectomy    . HEMORRHOID SURGERY    . HERNIA REPAIR      OB History    No data available       Home Medications    Prior to Admission medications   Medication Sig Start Date End Date Taking? Authorizing Provider  Amitriptyline HCl (ELAVIL PO) Take by mouth.    Historical Provider, MD  clarithromycin (BIAXIN) 500 MG tablet Take 500  mg by mouth 2 (two) times daily.    Historical Provider, MD  cyclobenzaprine (FLEXERIL) 10 MG tablet Take 1 tablet (10 mg total) by mouth 2 (two) times daily as needed for muscle spasms. 09/12/13   Hope Bunnie Pion, NP  HYDROcodone-acetaminophen (NORCO) 5-325 MG tablet Take 1-2 tablets by mouth every 6 (six) hours as needed. 10/23/16   Malvin Johns, MD  ibuprofen (ADVIL,MOTRIN) 600 MG tablet Take 1 tablet (600 mg total) by mouth every 6 (six) hours as needed. 08/23/16   Etta Quill, NP  Linaclotide (LINZESS PO) Take by mouth.    Historical Provider, MD  methocarbamol (ROBAXIN) 500 MG tablet Take 1 tablet (500 mg total) by mouth 2 (two) times daily. 07/15/16   April Palumbo, MD  metroNIDAZOLE (FLAGYL) 500 MG tablet Take 1 tablet (500 mg total) by mouth 2 (two) times daily. One po bid x 7 days 10/23/16   Malvin Johns, MD  naproxen (NAPROSYN) 500 MG tablet Take 1 tablet (500 mg total) by mouth 2 (two) times daily. 07/15/16   April Palumbo, MD  polyethylene glycol [facility] / GLYCOLAX) packet Take 17 grams daily with 8 ounces of water until bowels are moving well. 05/18/15   John Molpus, MD  predniSONE (DELTASONE) 20 MG tablet 3 tabs po day one, then 2 po daily x 4 days 10/23/16   Malvin Johns, MD  promethazine (PHENERGAN) 25 MG tablet  Take 1 tablet (25 mg total) by mouth every 6 (six) hours as needed for nausea or vomiting. 08/17/15   John Molpus, MD  ranitidine (ZANTAC) 150 MG tablet Take 1 tablet (150 mg total) by mouth 2 (two) times daily. 06/30/16   Leonard Schwartz, MD    Family History No family history on file.  Social History Social History  Substance Use Topics  . Smoking status: Current Every Day Smoker    Packs/day: 0.50    Types: Cigarettes  . Smokeless tobacco: Never Used  . Alcohol use No     Allergies   Codeine  Review of Systems Review of Systems  Constitutional: Negative for chills, diaphoresis, fatigue and fever.  HENT: Negative for congestion, rhinorrhea and sneezing.   Eyes:  Negative.   Respiratory: Negative for cough, chest tightness and shortness of breath.   Cardiovascular: Negative for chest pain and leg swelling.  Gastrointestinal: Positive for abdominal pain, nausea and vomiting. Negative for blood in stool and diarrhea.  Genitourinary: Negative for difficulty urinating, flank pain, frequency, hematuria, vaginal bleeding and vaginal discharge.  Musculoskeletal: Positive for arthralgias. Negative for back pain.  Skin: Negative for rash.  Neurological: Positive for numbness. Negative for dizziness, speech difficulty, weakness and headaches.   Physical Exam Updated Vital Signs BP 120/75 (BP Location: Left Arm)   Pulse 75   Temp 98.3 F (36.8 C) (Oral)   Resp 16   Ht 5\' 5"  (1.651 m)   Wt 140 lb (63.5 kg)   LMP 10/11/2016   SpO2 100%   BMI 23.30 kg/m   Physical Exam  Constitutional: She is oriented to person, place, and time. She appears well-developed and well-nourished.  HENT:  Head: Normocephalic and atraumatic.  Eyes: Pupils are equal, round, and reactive to light.  Neck: Normal range of motion. Neck supple.  Cardiovascular: Normal rate, regular rhythm and normal heart sounds.   Pulmonary/Chest: Effort normal and breath sounds normal. No respiratory distress. She has no wheezes. She has no rales. She exhibits no tenderness.  Abdominal: Soft. Bowel sounds are normal. There is tenderness. There is no rebound and no guarding.  Tenderness to left lower pelvic area and suprapubic area  Genitourinary:  Genitourinary Comments: Patient has large amount of thin white discharge. No cervical motion tenderness or adnexal tenderness  Musculoskeletal: Normal range of motion. She exhibits no edema.  pain over the left sciatic nerve; no bony tenderness on ROM of hip or knee; no swelling of the leg; pedal pulses intact; normal sensation and motor function in the leg  Lymphadenopathy:    She has no cervical adenopathy.  Neurological: She is alert and oriented  to person, place, and time.  Skin: Skin is warm and dry. No rash noted.  Psychiatric: She has a normal mood and affect.   ED Treatments / Results  DIAGNOSTIC STUDIES: Oxygen Saturation is 100% on RA, normal by my interpretation.    COORDINATION OF CARE: 8:39 PM-Discussed treatment plan which includes pelvic exam with pt at bedside and pt agreed to plan.    Labs (all labs ordered are listed, but only abnormal results are displayed) Labs Reviewed  WET PREP, GENITAL - Abnormal; Notable for the following:       Result Value   Yeast Wet Prep HPF POC PRESENT (*)    Trich, Wet Prep PRESENT (*)    Clue Cells Wet Prep HPF POC PRESENT (*)    WBC, Wet Prep HPF POC MODERATE (*)    All other components within  normal limits  URINALYSIS, ROUTINE W REFLEX MICROSCOPIC (NOT AT [facility]) - Abnormal; Notable for the following:    APPearance CLOUDY (*)    Hgb urine dipstick SMALL (*)    Leukocytes, UA MODERATE (*)    All other components within normal limits  URINE MICROSCOPIC-ADD ON - Abnormal; Notable for the following:    Squamous Epithelial / LPF 0-5 (*)    Bacteria, UA FEW (*)    All other components within normal limits  PREGNANCY, URINE  RPR  HIV ANTIBODY (ROUTINE TESTING)  GC/CHLAMYDIA PROBE AMP ([place]) NOT AT [facility]    EKG  EKG Interpretation None       Radiology No results found.  Procedures Procedures (including critical care time)  Medications Ordered in ED Medications  ketorolac (TORADOL) injection 60 mg (60 mg Intramuscular Given 10/23/16 2219)  metroNIDAZOLE (FLAGYL) tablet 500 mg (500 mg Oral Given 10/23/16 2254)     Initial Impression / Assessment and Plan / ED Course  I have reviewed the triage vital signs and the nursing notes.  Pertinent labs & imaging results that were available during my care of the patient were reviewed by me and considered in my medical decision making (see chart for details).  Clinical Course     Patient has evidence of  trichomonas. Her abdominal exam is non-concerning. She doesn't have symptoms that would be more consistent with ovarian torsion. No suggestions of tubo-ovarian abscess. She's otherwise well-appearing. She was started on Flagyl which will treat both of trichomonas and bacterial vaginosis. She was advised that she needs to have her partner tested and treated as well. As far as though hip pain seems to be sciatica. She has no bony tenderness. She currently is pain-free. She's neurologically intact. She was discharged home in good condition. She was given a prescription for a prednisone pack and a very short course of Vicodin.  Final Clinical Impressions(s) / ED Diagnoses   Final diagnoses:  BV (bacterial vaginosis)  Vaginal trichomoniasis  Sciatica of left side  I personally performed the services described in this documentation, which was scribed in my presence.  The recorded information has been reviewed and considered.    New Prescriptions Discharge Medication List as of 10/23/2016 10:58 PM    START taking these medications   Details  metroNIDAZOLE (FLAGYL) 500 MG tablet Take 1 tablet (500 mg total) by mouth 2 (two) times daily. One po bid x 7 days, Starting Tue 10/23/2016, Print    predniSONE (DELTASONE) 20 MG tablet 3 tabs po day one, then 2 po daily x 4 days, Print         Malvin Johns, MD 10/23/16 [phone]

## 2016-10-24 LAB — GC/CHLAMYDIA PROBE AMP (~~LOC~~) NOT AT ARMC
Chlamydia: NEGATIVE
Neisseria Gonorrhea: NEGATIVE

## 2016-10-25 LAB — HIV ANTIBODY (ROUTINE TESTING W REFLEX): HIV Screen 4th Generation wRfx: NONREACTIVE

## 2016-10-25 LAB — RPR: RPR Ser Ql: NONREACTIVE

## 2016-11-07 ENCOUNTER — Emergency Department (HOSPITAL_BASED_OUTPATIENT_CLINIC_OR_DEPARTMENT_OTHER)
Admission: EM | Admit: 2016-11-07 | Discharge: 2016-11-07 | Disposition: A | Discharge status: Home / Self Care | Payer: Medicaid Other | Attending: Emergency Medicine | Admitting: Emergency Medicine

## 2016-11-07 ENCOUNTER — Encounter (HOSPITAL_BASED_OUTPATIENT_CLINIC_OR_DEPARTMENT_OTHER): Payer: Self-pay | Admitting: Emergency Medicine

## 2016-11-07 DIAGNOSIS — Z202 Contact with and (suspected) exposure to infections with a predominantly sexual mode of transmission: Secondary | ICD-10-CM | POA: Insufficient documentation

## 2016-11-07 DIAGNOSIS — F17210 Nicotine dependence, cigarettes, uncomplicated: Secondary | ICD-10-CM | POA: Insufficient documentation

## 2016-11-07 MED ORDER — METRONIDAZOLE 500 MG PO TABS
2000.0000 mg | ORAL_TABLET | Freq: Once | ORAL | Status: AC
  Administered 2016-11-07: 2000 mg via ORAL
  Filled 2016-11-07: qty 4

## 2016-11-07 NOTE — ED Triage Notes (Signed)
Pt seen here earlier this month for same c/o.  Treated for Trich but has since been exposed again.  Wants to be sure she was not reinfected.

## 2016-11-07 NOTE — Discharge Instructions (Signed)
No unprotected sexual contact for 2 weeks.  Return to the emergency department if his symptoms worsen or change.

## 2016-11-07 NOTE — ED Provider Notes (Signed)
[facility] Provider Note   CSN: [id number] Arrival date & time: 11/07/16  0131     History   Chief Complaint Chief Complaint  Patient presents with  . STD check    HPI Tiffany Christensen is a 35 y.o. female.  Patient is a 35 year old female with history of migraines. She presents for evaluation of possible STD. She was seen here 2 weeks ago and diagnosed with Trichomonas. She was given Flagyl in the ED and discharged to home. She reports engaging in sexual contact with her significant other who was untreated at the time and is denying any symptoms himself. She is concerned that she may have been reinfected. She does report some itching, but denies discharge.   The history is provided by the patient.    Past Medical History:  Diagnosis Date  . Migraine     There are no active problems to display for this patient.   Past Surgical History:  Procedure Laterality Date  . BREAST SURGERY    . hemmorhoidectomy    . HEMORRHOID SURGERY    . HERNIA REPAIR      OB History    No data available       Home Medications    Prior to Admission medications   Medication Sig Start Date End Date Taking? Authorizing Provider  Amitriptyline HCl (ELAVIL PO) Take by mouth.    Historical Provider, MD  clarithromycin (BIAXIN) 500 MG tablet Take 500 mg by mouth 2 (two) times daily.    Historical Provider, MD  cyclobenzaprine (FLEXERIL) 10 MG tablet Take 1 tablet (10 mg total) by mouth 2 (two) times daily as needed for muscle spasms. 09/12/13   Hope Bunnie Pion, NP  HYDROcodone-acetaminophen (NORCO) 5-325 MG tablet Take 1-2 tablets by mouth every 6 (six) hours as needed. 10/23/16   Malvin Johns, MD  ibuprofen (ADVIL,MOTRIN) 600 MG tablet Take 1 tablet (600 mg total) by mouth every 6 (six) hours as needed. 08/23/16   Etta Quill, NP  Linaclotide (LINZESS PO) Take by mouth.    Historical Provider, MD  methocarbamol (ROBAXIN) 500 MG tablet Take 1 tablet (500 mg total) by mouth 2 (two)  times daily. 07/15/16   April Palumbo, MD  metroNIDAZOLE (FLAGYL) 500 MG tablet Take 1 tablet (500 mg total) by mouth 2 (two) times daily. One po bid x 7 days 10/23/16   Malvin Johns, MD  naproxen (NAPROSYN) 500 MG tablet Take 1 tablet (500 mg total) by mouth 2 (two) times daily. 07/15/16   April Palumbo, MD  polyethylene glycol [facility] / GLYCOLAX) packet Take 17 grams daily with 8 ounces of water until bowels are moving well. 05/18/15   John Molpus, MD  predniSONE (DELTASONE) 20 MG tablet 3 tabs po day one, then 2 po daily x 4 days 10/23/16   Malvin Johns, MD  promethazine (PHENERGAN) 25 MG tablet Take 1 tablet (25 mg total) by mouth every 6 (six) hours as needed for nausea or vomiting. 08/17/15   John Molpus, MD  ranitidine (ZANTAC) 150 MG tablet Take 1 tablet (150 mg total) by mouth 2 (two) times daily. 06/30/16   Leonard Schwartz, MD    Family History No family history on file.  Social History Social History  Substance Use Topics  . Smoking status: Current Every Day Smoker    Packs/day: 0.50    Types: Cigarettes  . Smokeless tobacco: Never Used  . Alcohol use No     Allergies   Codeine   Review of  Systems Review of Systems  All other systems reviewed and are negative.    Physical Exam Updated Vital Signs BP 117/74 (BP Location: Left Arm)   Pulse 90   Temp 98.1 F (36.7 C) (Oral)   Resp 16   Ht 5\' 5"  (1.651 m)   Wt 140 lb (63.5 kg)   LMP 10/11/2016   SpO2 98%   BMI 23.30 kg/m   Physical Exam  Constitutional: She is oriented to person, place, and time. She appears well-developed and well-nourished. No distress.  HENT:  Head: Normocephalic and atraumatic.  Neck: Normal range of motion. Neck supple.  Cardiovascular: Normal rate and regular rhythm.  Exam reveals no gallop and no friction rub.   No murmur heard. Pulmonary/Chest: Effort normal and breath sounds normal. No respiratory distress. She has no wheezes.  Abdominal: Soft. Bowel sounds are normal. She exhibits no  distension. There is no tenderness.  Musculoskeletal: Normal range of motion.  Neurological: She is alert and oriented to person, place, and time.  Skin: Skin is warm and dry. She is not diaphoretic.  Nursing note and vitals reviewed.    ED Treatments / Results  Labs (all labs ordered are listed, but only abnormal results are displayed) Labs Reviewed - No data to display  EKG  EKG Interpretation None       Radiology No results found.  Procedures Procedures (including critical care time)  Medications Ordered in ED Medications  metroNIDAZOLE (FLAGYL) tablet 2,000 mg (2,000 mg Oral Given 11/07/16 0216)     Initial Impression / Assessment and Plan / ED Course  I have reviewed the triage vital signs and the nursing notes.  Pertinent labs & imaging results that were available during my care of the patient were reviewed by me and considered in my medical decision making (see chart for details).  Clinical Course     Patient declines repeat pelvic examination. Her GC and chlamydia tests were -2 weeks ago and I do not feel need to be repeated. She will be given an additional dose of Flagyl and is to follow-up as needed. She is to refrain from unprotected sex with her partner for the next 2 weeks.   Final Clinical Impressions(s) / ED Diagnoses   Final diagnoses:  STD exposure    New Prescriptions Discharge Medication List as of 11/07/2016  2:15 AM       Veryl Speak, MD 11/07/16 [phone]

## 2016-11-13 ENCOUNTER — Encounter (HOSPITAL_BASED_OUTPATIENT_CLINIC_OR_DEPARTMENT_OTHER): Payer: Self-pay | Admitting: *Deleted

## 2016-11-13 ENCOUNTER — Emergency Department (HOSPITAL_BASED_OUTPATIENT_CLINIC_OR_DEPARTMENT_OTHER): Payer: Self-pay

## 2016-11-13 ENCOUNTER — Emergency Department (HOSPITAL_BASED_OUTPATIENT_CLINIC_OR_DEPARTMENT_OTHER)
Admission: EM | Admit: 2016-11-13 | Discharge: 2016-11-13 | Disposition: A | Discharge status: Home / Self Care | Payer: Self-pay | Attending: Emergency Medicine | Admitting: Emergency Medicine

## 2016-11-13 DIAGNOSIS — M545 Low back pain, unspecified: Secondary | ICD-10-CM

## 2016-11-13 DIAGNOSIS — F17210 Nicotine dependence, cigarettes, uncomplicated: Secondary | ICD-10-CM | POA: Insufficient documentation

## 2016-11-13 DIAGNOSIS — N83201 Unspecified ovarian cyst, right side: Secondary | ICD-10-CM | POA: Insufficient documentation

## 2016-11-13 DIAGNOSIS — D649 Anemia, unspecified: Secondary | ICD-10-CM | POA: Insufficient documentation

## 2016-11-13 DIAGNOSIS — R102 Pelvic and perineal pain: Secondary | ICD-10-CM

## 2016-11-13 DIAGNOSIS — D259 Leiomyoma of uterus, unspecified: Secondary | ICD-10-CM | POA: Insufficient documentation

## 2016-11-13 DIAGNOSIS — Z791 Long term (current) use of non-steroidal anti-inflammatories (NSAID): Secondary | ICD-10-CM | POA: Insufficient documentation

## 2016-11-13 LAB — URINE MICROSCOPIC-ADD ON

## 2016-11-13 LAB — URINALYSIS, ROUTINE W REFLEX MICROSCOPIC
Bilirubin Urine: NEGATIVE
Glucose, UA: NEGATIVE mg/dL
Ketones, ur: NEGATIVE mg/dL
Leukocytes, UA: NEGATIVE
Nitrite: NEGATIVE
Protein, ur: NEGATIVE mg/dL
Specific Gravity, Urine: 1.017 (ref 1.005–1.030)
pH: 5.5 (ref 5.0–8.0)

## 2016-11-13 LAB — WET PREP, GENITAL
Clue Cells Wet Prep HPF POC: NONE SEEN
Sperm: NONE SEEN
Trich, Wet Prep: NONE SEEN
Yeast Wet Prep HPF POC: NONE SEEN

## 2016-11-13 LAB — BASIC METABOLIC PANEL
Anion gap: 4 — ABNORMAL LOW (ref 5–15)
BUN: 6 mg/dL (ref 6–20)
CO2: 21 mmol/L — ABNORMAL LOW (ref 22–32)
Calcium: 8.9 mg/dL (ref 8.9–10.3)
Chloride: 111 mmol/L (ref 101–111)
Creatinine, Ser: 0.49 mg/dL (ref 0.44–1.00)
GFR calc Af Amer: >60 mL/min (ref >60)
GFR calc non Af Amer: >60 mL/min (ref >60)
Glucose, Bld: 99 mg/dL (ref 65–99)
Potassium: 3.5 mmol/L (ref 3.5–5.1)
Sodium: 136 mmol/L (ref 135–145)

## 2016-11-13 LAB — CBC WITH DIFFERENTIAL/PLATELET
Basophils Absolute: 0.1 10*3/uL (ref 0.0–0.1)
Basophils Relative: 1 %
Eosinophils Absolute: 0.1 10*3/uL (ref 0.0–0.7)
Eosinophils Relative: 1 %
HCT: 23 % — ABNORMAL LOW (ref 36.0–46.0)
Hemoglobin: 7.1 g/dL — ABNORMAL LOW (ref 12.0–15.0)
Lymphocytes Relative: 30 %
Lymphs Abs: 2.1 10*3/uL (ref 0.7–4.0)
MCH: 19.1 pg — ABNORMAL LOW (ref 26.0–34.0)
MCHC: 30.9 g/dL (ref 30.0–36.0)
MCV: 61.8 fL — ABNORMAL LOW (ref 78.0–100.0)
Monocytes Absolute: 0.6 10*3/uL (ref 0.1–1.0)
Monocytes Relative: 9 %
Neutro Abs: 4 10*3/uL (ref 1.7–7.7)
Neutrophils Relative %: 59 %
Platelets: 375 10*3/uL (ref 150–400)
RBC: 3.72 MIL/uL — ABNORMAL LOW (ref 3.87–5.11)
RDW: 18.5 % — ABNORMAL HIGH (ref 11.5–15.5)
WBC: 6.9 10*3/uL (ref 4.0–10.5)

## 2016-11-13 LAB — PREGNANCY, URINE: Preg Test, Ur: NEGATIVE

## 2016-11-13 MED ORDER — MORPHINE SULFATE (PF) 4 MG/ML IV SOLN
4.0000 mg | Freq: Once | INTRAVENOUS | Status: AC
  Administered 2016-11-13: 4 mg via INTRAMUSCULAR
  Filled 2016-11-13: qty 1

## 2016-11-13 MED ORDER — DIAZEPAM 5 MG PO TABS
5.0000 mg | ORAL_TABLET | Freq: Once | ORAL | Status: AC
  Administered 2016-11-13: 5 mg via ORAL
  Filled 2016-11-13: qty 1

## 2016-11-13 MED ORDER — ONDANSETRON HCL 4 MG PO TABS: 4.0000 mg | ORAL_TABLET | Freq: Three times a day (TID) | ORAL | 0 refills | Status: DC | PRN

## 2016-11-13 MED ORDER — MELOXICAM 7.5 MG PO TABS: 7.5000 mg | ORAL_TABLET | Freq: Every day | ORAL | 0 refills | Status: DC | PRN

## 2016-11-13 MED ORDER — MORPHINE SULFATE (PF) 4 MG/ML IV SOLN: 4.0000 mg | Freq: Once | INTRAVENOUS | Status: DC

## 2016-11-13 MED ORDER — KETOROLAC TROMETHAMINE 30 MG/ML IJ SOLN
30.0000 mg | Freq: Once | INTRAMUSCULAR | Status: AC
  Administered 2016-11-13: 30 mg via INTRAMUSCULAR
  Filled 2016-11-13: qty 1

## 2016-11-13 MED ORDER — METHOCARBAMOL 500 MG PO TABS: 500.0000 mg | ORAL_TABLET | Freq: Four times a day (QID) | ORAL | 0 refills | Status: DC | PRN

## 2016-11-13 MED FILL — MELOXICAM 7.5 MG TABLET: 7.5 | 14 days supply | Qty: 14 | Fill #0

## 2016-11-13 MED FILL — METHOCARBAMOL 500 MG TABLET: 500 | 3 days supply | Qty: 20 | Fill #0

## 2016-11-13 NOTE — ED Provider Notes (Signed)
[place] DE[facility] Provider Note   CSN: [id number] Arrival date & time: 11/13/16  [id number]     History   Chief Complaint Chief Complaint  Patient presents with  . Pelvic Pain  . Tailbone Pain    HPI Tiffany Christensen is a 35 y.o. female.  HPI   Patient with hx fibroids presents with a sudden suprapubic pain that radiated around to her back this morning around 7:30 while she was standing at work.  The pain is described as pressure, similar to a contraction, is exacerbated by movement and palpation.  It caused her legs to tingle.  LMP 5 days ago, normal and on time, normal flow.  Denies fevers, chills, body aches, N/V, urinary symptoms, abnormal vaginal discharge, change in bowel habits.  Denies any heavy lifting or other injury.  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  Hx abdominal surgeries: umbilical hernia repair only.    Past Medical History:  Diagnosis Date  . Migraine     There are no active problems to display for this patient.   Past Surgical History:  Procedure Laterality Date  . BREAST SURGERY    . hemmorhoidectomy    . HEMORRHOID SURGERY    . HERNIA REPAIR      OB History    No data available       Home Medications    Prior to Admission medications   Medication Sig Start Date End Date Taking? Authorizing Provider  Amitriptyline HCl (ELAVIL PO) Take by mouth.   Yes Historical Provider, MD  naproxen (NAPROSYN) 500 MG tablet Take 1 tablet (500 mg total) by mouth 2 (two) times daily. 07/15/16  Yes April Palumbo, MD  meloxicam (MOBIC) 7.5 MG tablet Take 1 tablet (7.5 mg total) by mouth daily as needed for pain. 11/13/16   Clayton Bibles, PA-C  methocarbamol (ROBAXIN) 500 MG tablet Take 1-2 tablets (500-1,000 mg total) by mouth every 6 (six) hours as needed for muscle spasms (and pain). 11/13/16   Clayton Bibles, PA-C  ondansetron (ZOFRAN) 4 MG tablet Take 1 tablet (4 mg total) by mouth every 8  (eight) hours as needed for nausea or vomiting. 11/13/16   Clayton Bibles, PA-C    Family History No family history on file.  Social History Social History  Substance Use Topics  . Smoking status: Current Every Day Smoker    Packs/day: 0.50    Types: Cigarettes  . Smokeless tobacco: Never Used  . Alcohol use No     Allergies   Codeine   Review of Systems Review of Systems  All other systems reviewed and are negative.    Physical Exam Updated Vital Signs BP 126/87 (BP Location: Right Arm)   Pulse 87   Temp 98.1 F (36.7 C) (Oral)   Resp 16   Ht 5\' 5"  (1.651 m)   Wt 63.5 kg   LMP 11/08/2016 (Exact Date)   SpO2 100%   BMI 23.30 kg/m   Physical Exam  Constitutional: She appears well-developed and well-nourished. No distress.  HENT:  Head: Normocephalic and atraumatic.  Neck: Neck supple.  Cardiovascular: Normal rate and regular rhythm.   Pulmonary/Chest: Effort normal and breath sounds normal. No respiratory distress. She has no wheezes. She has no rales.  Abdominal: Soft. She exhibits no distension. There is tenderness in the suprapubic area. There is no rebound and no guarding.  Genitourinary: Uterus is tender. Right adnexum  displays tenderness. Right adnexum displays no mass and no fullness. Left adnexum displays no mass, no tenderness and no fullness. There is bleeding in the vagina. No erythema or tenderness in the vagina. No foreign body in the vagina. No signs of injury around the vagina. No vaginal discharge found.  Musculoskeletal:       Back:  Neurological: She is alert.  Skin: She is not diaphoretic.  Nursing note and vitals reviewed.    ED Treatments / Results  Labs (all labs ordered are listed, but only abnormal results are displayed) Labs Reviewed  WET PREP, GENITAL - Abnormal; Notable for the following:       Result Value   WBC, Wet Prep HPF POC FEW (*)    All other components within normal limits  URINALYSIS, ROUTINE W REFLEX MICROSCOPIC -  Abnormal; Notable for the following:    APPearance CLOUDY (*)    Hgb urine dipstick LARGE (*)    All other components within normal limits  URINE MICROSCOPIC-ADD ON - Abnormal; Notable for the following:    Squamous Epithelial / LPF 0-5 (*)    Bacteria, UA FEW (*)    All other components within normal limits  BASIC METABOLIC PANEL - Abnormal; Notable for the following:    CO2 21 (*)    Anion gap 4 (*)    All other components within normal limits  CBC WITH DIFFERENTIAL/PLATELET - Abnormal; Notable for the following:    RBC 3.72 (*)    Hemoglobin 7.1 (*)    HCT 23.0 (*)    MCV 61.8 (*)    MCH 19.1 (*)    RDW 18.5 (*)    All other components within normal limits  PREGNANCY, URINE  RPR  HIV ANTIBODY (ROUTINE TESTING)  GC/CHLAMYDIA PROBE AMP ([place]) NOT AT [facility]    EKG  EKG Interpretation None       Radiology US Transvaginal Non-ob  Result Date: 11/13/2016 CLINICAL DATA:  Acute onset of pelvic pain over the last 3 hours. EXAM: TRANSABDOMINAL AND TRANSVAGINAL ULTRASOUND OF PELVIS DOPPLER ULTRASOUND OF OVARIES TECHNIQUE: Both transabdominal and transvaginal ultrasound examinations of the pelvis were performed. Transabdominal technique was performed for global imaging of the pelvis including uterus, ovaries, adnexal regions, and pelvic cul-de-sac. It was necessary to proceed with endovaginal exam following the transabdominal exam to visualize the ovaries and endometrium. Color and duplex Doppler ultrasound was utilized to evaluate blood flow to the ovaries. COMPARISON:  CT of the abdomen and pelvis 05/18/2015. Ultrasound 06/21/2010. FINDINGS: Uterus Measurements: 10.6 x 6.0 x 7.1 cm. A least 4 discrete uterine fibroids are noted. The largest lesions anterior on the right measuring 3.0 x 2.3 x 2.8 cm. An anterior left lesion measures 2.9 cm maximally. A posterior lesion measures 2.7 cm. An additional right at the fundus measures 2.3 cm maximally. Endometrium Thickness: 3 mm, within  normal limits. No focal abnormality visualized. Right ovary Measurements: 5.1 x 3.7 x 3.9 cm. 83.1 x 3.3 x 3.3 cm cyst demonstrates some internal echoes. There is increased color Doppler flow with the periphery. No other focal lesions are present. Left ovary Measurements: 3.3 x 2.0 x 2.8 cm, within normal limits. Normal appearance/no adnexal mass. Pulsed Doppler evaluation of both ovaries demonstrates normal low-resistance arterial and venous waveforms. Other findings No abnormal free fluid. IMPRESSION: 1. Complex 3.3 cm right ovarian cyst. This is almost certainly benign, and no specific imaging follow up is recommended according to the Society of Radiologists in [place] (D  Clovis Riley et al. Management of Asymptomatic Ovarian and Other Adnexal Cysts Imaged at [place]: Society of Radiologists in [place] Statement 2010. Radiology 256 (Sept 2010): 943-954.). 2. Multiple uterine fibroids.  These could also be a source of pain. 3. Normal arterial and venous flow to the ovaries bilaterally. Electronically Signed   By: San Morelle M.D.   On: 11/13/2016 11:05   US Pelvis Complete  Result Date: 11/13/2016 CLINICAL DATA:  Acute onset of pelvic pain over the last 3 hours. EXAM: TRANSABDOMINAL AND TRANSVAGINAL ULTRASOUND OF PELVIS DOPPLER ULTRASOUND OF OVARIES TECHNIQUE: Both transabdominal and transvaginal ultrasound examinations of the pelvis were performed. Transabdominal technique was performed for global imaging of the pelvis including uterus, ovaries, adnexal regions, and pelvic cul-de-sac. It was necessary to proceed with endovaginal exam following the transabdominal exam to visualize the ovaries and endometrium. Color and duplex Doppler ultrasound was utilized to evaluate blood flow to the ovaries. COMPARISON:  CT of the abdomen and pelvis 05/18/2015. Ultrasound 06/21/2010. FINDINGS: Uterus Measurements: 10.6 x 6.0 x 7.1 cm. A least 4 discrete uterine  fibroids are noted. The largest lesions anterior on the right measuring 3.0 x 2.3 x 2.8 cm. An anterior left lesion measures 2.9 cm maximally. A posterior lesion measures 2.7 cm. An additional right at the fundus measures 2.3 cm maximally. Endometrium Thickness: 3 mm, within normal limits. No focal abnormality visualized. Right ovary Measurements: 5.1 x 3.7 x 3.9 cm. 83.1 x 3.3 x 3.3 cm cyst demonstrates some internal echoes. There is increased color Doppler flow with the periphery. No other focal lesions are present. Left ovary Measurements: 3.3 x 2.0 x 2.8 cm, within normal limits. Normal appearance/no adnexal mass. Pulsed Doppler evaluation of both ovaries demonstrates normal low-resistance arterial and venous waveforms. Other findings No abnormal free fluid. IMPRESSION: 1. Complex 3.3 cm right ovarian cyst. This is almost certainly benign, and no specific imaging follow up is recommended according to the Society of Radiologists in [place] (D Clovis Riley et al. Management of Asymptomatic Ovarian and Other Adnexal Cysts Imaged at [place]: Society of Radiologists in [place] 2010. Radiology 256 (Sept 2010): 943-954.). 2. Multiple uterine fibroids.  These could also be a source of pain. 3. Normal arterial and venous flow to the ovaries bilaterally. Electronically Signed   By: San Morelle M.D.   On: 11/13/2016 11:05   [place] Art/ven Flow Abd Pelv Doppler  Result Date: 11/13/2016 CLINICAL DATA:  Acute onset of pelvic pain over the last 3 hours. EXAM: TRANSABDOMINAL AND TRANSVAGINAL ULTRASOUND OF PELVIS DOPPLER ULTRASOUND OF OVARIES TECHNIQUE: Both transabdominal and transvaginal ultrasound examinations of the pelvis were performed. Transabdominal technique was performed for global imaging of the pelvis including uterus, ovaries, adnexal regions, and pelvic cul-de-sac. It was necessary to proceed with endovaginal exam following the transabdominal  exam to visualize the ovaries and endometrium. Color and duplex Doppler ultrasound was utilized to evaluate blood flow to the ovaries. COMPARISON:  CT of the abdomen and pelvis 05/18/2015. Ultrasound 06/21/2010. FINDINGS: Uterus Measurements: 10.6 x 6.0 x 7.1 cm. A least 4 discrete uterine fibroids are noted. The largest lesions anterior on the right measuring 3.0 x 2.3 x 2.8 cm. An anterior left lesion measures 2.9 cm maximally. A posterior lesion measures 2.7 cm. An additional right at the fundus measures 2.3 cm maximally. Endometrium Thickness: 3 mm, within normal limits. No focal abnormality visualized. Right ovary Measurements: 5.1 x 3.7 x 3.9 cm. 83.1 x 3.3 x 3.3 cm cyst demonstrates some  internal echoes. There is increased color Doppler flow with the periphery. No other focal lesions are present. Left ovary Measurements: 3.3 x 2.0 x 2.8 cm, within normal limits. Normal appearance/no adnexal mass. Pulsed Doppler evaluation of both ovaries demonstrates normal low-resistance arterial and venous waveforms. Other findings No abnormal free fluid. IMPRESSION: 1. Complex 3.3 cm right ovarian cyst. This is almost certainly benign, and no specific imaging follow up is recommended according to the Society of Radiologists in [place] (D Clovis Riley et al. Management of Asymptomatic Ovarian and Other Adnexal Cysts Imaged at [place]: Society of Radiologists in [place] 2010. Radiology 256 (Sept 2010): 943-954.). 2. Multiple uterine fibroids.  These could also be a source of pain. 3. Normal arterial and venous flow to the ovaries bilaterally. Electronically Signed   By: San Morelle M.D.   On: 11/13/2016 11:05    Procedures Procedures (including critical care time)  Medications Ordered in ED Medications  diazepam (VALIUM) tablet 5 mg (5 mg Oral Given 11/13/16 0931)  ketorolac (TORADOL) 30 MG/ML injection 30 mg (30 mg Intramuscular Given 11/13/16  0931)  morphine 4 MG/ML injection 4 mg (4 mg Intramuscular Given 11/13/16 1202)     Initial Impression / Assessment and Plan / ED Course  I have reviewed the triage vital signs and the nursing notes.  Pertinent labs & imaging results that were available during my care of the patient were reviewed by me and considered in my medical decision making (see chart for details).  Clinical Course     Afebrile, nontoxic patient with suprapubic pain radiating to the back.  Pelvic exam remarkable only for midline tenderness, small amount of menstrual blood.  Pelvic US demonstrates multiple fibroids and right ovarian cyst.  No torsion.  No PID on exam.  No UTI.  Pt is not pregnant.  Doubt appendicitis.  Suspect pain from fibroids vs musculoskeletal pain.  Labs significant for worsening chronic anemia, likely from fibroids.  Pt advised to follow up with OBGYN.  Pt does not have symptoms of anemia.  D/C home with mobic, zofran, GYN follow up.  Discussed result, findings, treatment, and follow up  with patient.  Pt given return precautions.  Pt verbalizes understanding and agrees with plan.       Final Clinical Impressions(s) / ED Diagnoses   Final diagnoses:  Pelvic pain in female  Acute bilateral low back pain without sciatica  Uterine leiomyoma, unspecified location  Cyst of right ovary  Anemia, unspecified type    New Prescriptions Discharge Medication List as of 11/13/2016 12:52 PM    START taking these medications   Details  meloxicam (MOBIC) 7.5 MG tablet Take 1 tablet (7.5 mg total) by mouth daily as needed for pain., Starting Tue 11/13/2016, Print    ondansetron (ZOFRAN) 4 MG tablet Take 1 tablet (4 mg total) by mouth every 8 (eight) hours as needed for nausea or vomiting., Starting Tue 11/13/2016, Print         [place], PA-C 11/13/16 1412    Charlesetta Shanks, MD 11/21/16 [phone]

## 2016-11-13 NOTE — ED Triage Notes (Signed)
Pt reports pelvic pain and lower back/buttock pain that began suddenly this morning around 0730 while at work. Denies heavy lifting, other known cause of an injury. Denies genitourinary symptoms (reports recent treatment for STDs). Denies fever, incontinence of bowel/bladder, n/v/d. Reports pain radiates down both legs. Pt states she will call her employer to ask if UDS is required.

## 2016-11-13 NOTE — ED Notes (Signed)
Spoke with Patterson Hammersmith, pt's supervisor, who states that pt does not need a urine drug screen.

## 2016-11-13 NOTE — ED Triage Notes (Signed)
Pt reports taking 1 Aleve prior to 6am for menstrual cramping.

## 2016-11-13 NOTE — Discharge Instructions (Signed)
Read the information below.  Use the prescribed medication as directed.  Please discuss all new medications with your pharmacist.  You may return to the Emergency Department at any time for worsening condition or any new symptoms that concern you.   If you develop high fevers, worsening pelvic or abdominal pain, uncontrolled vomiting, or are unable to tolerate fluids by mouth, return to the ER for a recheck.

## 2016-11-14 LAB — RPR: RPR Ser Ql: NONREACTIVE

## 2016-11-14 LAB — GC/CHLAMYDIA PROBE AMP (~~LOC~~) NOT AT ARMC
Chlamydia: NEGATIVE
Neisseria Gonorrhea: NEGATIVE

## 2016-11-14 LAB — HIV ANTIBODY (ROUTINE TESTING W REFLEX): HIV Screen 4th Generation wRfx: NONREACTIVE

## 2016-12-04 ENCOUNTER — Encounter (HOSPITAL_BASED_OUTPATIENT_CLINIC_OR_DEPARTMENT_OTHER): Payer: Self-pay

## 2016-12-04 ENCOUNTER — Inpatient Hospital Stay (HOSPITAL_BASED_OUTPATIENT_CLINIC_OR_DEPARTMENT_OTHER)
Admission: EM | Admit: 2016-12-04 | Discharge: 2016-12-08 | DRG: 812 | Disposition: A | Discharge status: Home / Self Care | Payer: Medicaid Other | Attending: Internal Medicine | Admitting: Internal Medicine

## 2016-12-04 DIAGNOSIS — E538 Deficiency of other specified B group vitamins: Secondary | ICD-10-CM | POA: Diagnosis present

## 2016-12-04 DIAGNOSIS — N83202 Unspecified ovarian cyst, left side: Secondary | ICD-10-CM | POA: Diagnosis present

## 2016-12-04 DIAGNOSIS — G8929 Other chronic pain: Secondary | ICD-10-CM | POA: Diagnosis present

## 2016-12-04 DIAGNOSIS — D259 Leiomyoma of uterus, unspecified: Secondary | ICD-10-CM | POA: Diagnosis present

## 2016-12-04 DIAGNOSIS — R1012 Left upper quadrant pain: Secondary | ICD-10-CM

## 2016-12-04 DIAGNOSIS — Z8249 Family history of ischemic heart disease and other diseases of the circulatory system: Secondary | ICD-10-CM

## 2016-12-04 DIAGNOSIS — Z3202 Encounter for pregnancy test, result negative: Secondary | ICD-10-CM | POA: Diagnosis present

## 2016-12-04 DIAGNOSIS — D529 Folate deficiency anemia, unspecified: Secondary | ICD-10-CM

## 2016-12-04 DIAGNOSIS — D649 Anemia, unspecified: Secondary | ICD-10-CM | POA: Diagnosis present

## 2016-12-04 DIAGNOSIS — F17210 Nicotine dependence, cigarettes, uncomplicated: Secondary | ICD-10-CM | POA: Diagnosis present

## 2016-12-04 DIAGNOSIS — R109 Unspecified abdominal pain: Secondary | ICD-10-CM

## 2016-12-04 DIAGNOSIS — K5909 Other constipation: Secondary | ICD-10-CM | POA: Diagnosis present

## 2016-12-04 DIAGNOSIS — D500 Iron deficiency anemia secondary to blood loss (chronic): Principal | ICD-10-CM | POA: Diagnosis present

## 2016-12-04 DIAGNOSIS — K257 Chronic gastric ulcer without hemorrhage or perforation: Secondary | ICD-10-CM

## 2016-12-04 DIAGNOSIS — N920 Excessive and frequent menstruation with regular cycle: Secondary | ICD-10-CM

## 2016-12-04 HISTORY — DX: Unspecified ovarian cyst, unspecified side: N83.209

## 2016-12-04 HISTORY — DX: Leiomyoma of uterus, unspecified: D25.9

## 2016-12-04 LAB — COMPREHENSIVE METABOLIC PANEL
ALT: 7 U/L — ABNORMAL LOW (ref 14–54)
AST: 15 U/L (ref 15–41)
Albumin: 3.5 g/dL (ref 3.5–5.0)
Alkaline Phosphatase: 89 U/L (ref 38–126)
Anion gap: 5 (ref 5–15)
BUN: 6 mg/dL (ref 6–20)
CO2: 21 mmol/L — ABNORMAL LOW (ref 22–32)
Calcium: 8.5 mg/dL — ABNORMAL LOW (ref 8.9–10.3)
Chloride: 107 mmol/L (ref 101–111)
Creatinine, Ser: 0.53 mg/dL (ref 0.44–1.00)
GFR calc Af Amer: >60 mL/min (ref >60)
GFR calc non Af Amer: >60 mL/min (ref >60)
Glucose, Bld: 96 mg/dL (ref 65–99)
Potassium: 3.7 mmol/L (ref 3.5–5.1)
Sodium: 133 mmol/L — ABNORMAL LOW (ref 135–145)
Total Bilirubin: 0.2 mg/dL — ABNORMAL LOW (ref 0.3–1.2)
Total Protein: 6.9 g/dL (ref 6.5–8.1)

## 2016-12-04 LAB — URINALYSIS, ROUTINE W REFLEX MICROSCOPIC
Bilirubin Urine: NEGATIVE
Glucose, UA: NEGATIVE mg/dL
Ketones, ur: NEGATIVE mg/dL
Leukocytes, UA: NEGATIVE
Nitrite: NEGATIVE
Protein, ur: NEGATIVE mg/dL
Specific Gravity, Urine: 1.014 (ref 1.005–1.030)
pH: 5.5 (ref 5.0–8.0)

## 2016-12-04 LAB — IRON AND TIBC
Iron: 11 ug/dL — ABNORMAL LOW (ref 28–170)
Saturation Ratios: 2 % — ABNORMAL LOW (ref 10.4–31.8)
TIBC: 545 ug/dL — ABNORMAL HIGH (ref 250–450)
UIBC: 534 ug/dL

## 2016-12-04 LAB — URINALYSIS, MICROSCOPIC (REFLEX): WBC, UA: NONE SEEN WBC/hpf (ref 0–5)

## 2016-12-04 LAB — FERRITIN: Ferritin: 1 ng/mL — ABNORMAL LOW (ref 11–307)

## 2016-12-04 LAB — RETICULOCYTES
RBC.: 3.58 MIL/uL — ABNORMAL LOW (ref 3.87–5.11)
Retic Count, Absolute: 64.4 10*3/uL (ref 19.0–186.0)
Retic Ct Pct: 1.8 % (ref 0.4–3.1)

## 2016-12-04 LAB — OCCULT BLOOD X 1 CARD TO LAB, STOOL: Fecal Occult Bld: NEGATIVE

## 2016-12-04 LAB — LIPASE, BLOOD: Lipase: 52 U/L — ABNORMAL HIGH (ref 11–51)

## 2016-12-04 LAB — VITAMIN B12: Vitamin B-12: 452 pg/mL (ref 180–914)

## 2016-12-04 LAB — FOLATE: Folate: 3.8 ng/mL — ABNORMAL LOW (ref >5.9)

## 2016-12-04 LAB — CBC
HCT: 22.2 % — ABNORMAL LOW (ref 36.0–46.0)
Hemoglobin: 6.6 g/dL — CL (ref 12.0–15.0)
MCH: 18.4 pg — ABNORMAL LOW (ref 26.0–34.0)
MCHC: 29.7 g/dL — ABNORMAL LOW (ref 30.0–36.0)
MCV: 62 fL — ABNORMAL LOW (ref 78.0–100.0)
Platelets: 378 10*3/uL (ref 150–400)
RBC: 3.58 MIL/uL — ABNORMAL LOW (ref 3.87–5.11)
RDW: 18.9 % — ABNORMAL HIGH (ref 11.5–15.5)
WBC: 6 10*3/uL (ref 4.0–10.5)

## 2016-12-04 LAB — PREGNANCY, URINE: Preg Test, Ur: NEGATIVE

## 2016-12-04 MED ORDER — MORPHINE SULFATE (PF) 4 MG/ML IV SOLN
4.0000 mg | Freq: Once | INTRAVENOUS | Status: AC
Start: 2016-12-04 — End: 2016-12-04
  Administered 2016-12-04: 4 mg via INTRAVENOUS
  Filled 2016-12-04: qty 1

## 2016-12-04 MED ORDER — GI COCKTAIL ~~LOC~~
30.0000 mL | Freq: Once | ORAL | Status: AC
  Administered 2016-12-04: 30 mL via ORAL
  Filled 2016-12-04: qty 30

## 2016-12-04 MED ORDER — ONDANSETRON HCL 4 MG/2ML IJ SOLN
4.0000 mg | Freq: Once | INTRAMUSCULAR | Status: AC
  Administered 2016-12-04: 4 mg via INTRAVENOUS
  Filled 2016-12-04: qty 2

## 2016-12-04 MED ORDER — PANTOPRAZOLE SODIUM 40 MG IV SOLR
40.0000 mg | Freq: Once | INTRAVENOUS | Status: AC
  Administered 2016-12-04: 40 mg via INTRAVENOUS
  Filled 2016-12-04: qty 40

## 2016-12-04 MED ORDER — SODIUM CHLORIDE 0.9 % IV BOLUS (SEPSIS)
1000.0000 mL | Freq: Once | INTRAVENOUS | Status: AC
  Administered 2016-12-04: 1000 mL via INTRAVENOUS

## 2016-12-04 NOTE — Progress Notes (Signed)
This is a no charge note   Transfer from [facility] per PA, Dorothea Ogle  35 year old lady with past medical history of gastric ulcer, tobacco abuse, migraine headache, uterine fibroid, ovarian cyst, who presents with LUQ abdominal pain for more than 2 weeks. Pt reports she visited the ED on 12/05 for similar abdominal pain and was diagnosed with an ovarian cyst and uterine fibroids and prescribed medication with no relief. She denies vomiting, diarrhea, shortness of breath, diaphoresis, dysuria, vaginal bleeding, vaginal discharge. Pt was found to have hemoglobin 6.6 which was 7.1 on 11/13/16, lipase 52, negative pregnancy test, negative urinalysis for UTI, negative FOBT. Pending anemia panel. Creatinine normal, sodium 133, potassium normal, temperature normal, heart rate 90s, Bp 125/84. Pt is accepted to med-surg bed for obs.  Ivor Costa, MD  [facility] Pager [phone]  If 7PM-7AM, please contact night-coverage [email or web address] Password [facility] 12/04/2016, 8:02 PM     .

## 2016-12-04 NOTE — ED Notes (Signed)
Attempted to call report to [place], [place] but she states she was unable to receive report at that time. Left name and number for her to return call.

## 2016-12-04 NOTE — ED Provider Notes (Signed)
[facility] DEPT [facility] Provider Note   CSN: [id number] Arrival date & time: 12/04/16  1620  By signing my name below, I, Tiffany Christensen, attest that this documentation has been prepared under the direction and in the presence of non-physician practitioner, Shary Decamp, PA-C. Electronically Signed: Higinio Christensen, Scribe. 12/04/2016. 5:48 PM.  History   Chief Complaint Chief Complaint  Patient presents with  . Abdominal Pain   The history is provided by the patient. No language interpreter was used.   HPI Comments: Tiffany Christensen is a 35 y.o. female with PMHx of gastric ulcers, who presents to the Emergency Department complaining of persistent, abdominal pain that began 2 weeks ago. Pt reports she visited the ED on 12/05 for similar abdominal pain and was diagnosed with an ovarian cyst and uterine fibroids and prescribed medication with no relief. She states she was driving today when she gradually began to experience LUQ and epigastric abdominal pain. She notes associated nausea and reports her pain is exacerbated with movement. Pt voluntarily admits to "eating cornstarch powder out of the container." She denies vomiting, diarrhea, shortness of breath, diaphoresis, dysuria, vaginal bleeding, vaginal discharge, hx of HTN, HLD, DM and FHx of cardiac problems.   Past Medical History:  Diagnosis Date  . Migraine   . Ovarian cyst   . Uterine fibroid    There are no active problems to display for this patient.  Past Surgical History:  Procedure Laterality Date  . BREAST SURGERY    . hemmorhoidectomy    . HEMORRHOID SURGERY    . HERNIA REPAIR      OB History    No data available     Home Medications    Prior to Admission medications   Not on File    Family History No family history on file.  Social History Social History  Substance Use Topics  . Smoking status: Current Every Day Smoker    Packs/day: 0.50    Types: Cigarettes  . Smokeless tobacco: Never Used  . Alcohol use No    Allergies   Codeine  Review of Systems Review of Systems  Constitutional: Negative for diaphoresis.  Respiratory: Negative for shortness of breath.   Gastrointestinal: Positive for abdominal pain and nausea. Negative for diarrhea and vomiting.  Genitourinary: Negative for dysuria, vaginal bleeding and vaginal discharge.   Physical Exam Updated Vital Signs BP 126/71 (BP Location: Left Arm)   Pulse 90   Temp 98.3 F (36.8 C) (Oral)   Resp 18   Ht 5\' 5"  (1.651 m)   Wt 134 lb (60.8 kg)   LMP 11/08/2016 (Exact Date)   SpO2 100%   BMI 22.30 kg/m   Physical Exam  Constitutional: She is oriented to person, place, and time. Vital signs are normal. She appears well-developed and well-nourished.  HENT:  Head: Normocephalic and atraumatic.  Right Ear: Hearing normal.  Left Ear: Hearing normal.  Eyes: Conjunctivae and EOM are normal. Pupils are equal, round, and reactive to light.  Neck: Normal range of motion. Neck supple.  Cardiovascular: Normal rate, regular rhythm, normal heart sounds and intact distal pulses.   Pulmonary/Chest: Effort normal and breath sounds normal.  Abdominal: Soft. Normal appearance and bowel sounds are normal. She exhibits no distension. There is tenderness in the left upper quadrant and left lower quadrant. There is no rigidity, no rebound, no guarding, no CVA tenderness, no tenderness at McBurney's point and negative Murphy's sign.  Genitourinary:  Genitourinary Comments: Chaperone was present. Patient with no pain  around the rectal area. There are no external fissures noted. No induration of the skin or swelling. No external hemorrhoids seen. Patient able to tolerate examination. I was able to feel the first 2-3cm of the rectum digitally without gross abnormality. There is no gross blood.  NO signs of perirectal abscess.  Musculoskeletal: Normal range of motion.  Neurological: She is alert and oriented to person, place, and time.  Skin: Skin is warm and dry.   Psychiatric: She has a normal mood and affect. Her speech is normal and behavior is normal. Thought content normal.  Nursing note and vitals reviewed.  ED Treatments / Results  Labs (all labs ordered are listed, but only abnormal results are displayed) Labs Reviewed  URINALYSIS, ROUTINE W REFLEX MICROSCOPIC - Abnormal; Notable for the following:       Result Value   Hgb urine dipstick LARGE (*)    All other components within normal limits  URINALYSIS, MICROSCOPIC (REFLEX) - Abnormal; Notable for the following:    Bacteria, UA RARE (*)    Squamous Epithelial / LPF 0-5 (*)    All other components within normal limits  CBC - Abnormal; Notable for the following:    RBC 3.58 (*)    Hemoglobin 6.6 (*)    HCT 22.2 (*)    MCV 62.0 (*)    MCH 18.4 (*)    MCHC 29.7 (*)    RDW 18.9 (*)    All other components within normal limits  COMPREHENSIVE METABOLIC PANEL - Abnormal; Notable for the following:    Sodium 133 (*)    CO2 21 (*)    Calcium 8.5 (*)    ALT 7 (*)    Total Bilirubin 0.2 (*)    All other components within normal limits  LIPASE, BLOOD - Abnormal; Notable for the following:    Lipase 52 (*)    All other components within normal limits  PREGNANCY, URINE  OCCULT BLOOD X 1 CARD TO LAB, STOOL  VITAMIN B12  FOLATE  IRON AND TIBC  FERRITIN  RETICULOCYTES    EKG  EKG Interpretation None       Radiology No results found.  Procedures Procedures (including critical care time)  Medications Ordered in ED Medications - No data to display  DIAGNOSTIC STUDIES:  Oxygen Saturation is 100% on RA, normal by my interpretation.    COORDINATION OF CARE:  5:45 PM Discussed treatment Christensen with pt at bedside and pt agreed to Christensen.  Initial Impression / Assessment and Christensen / ED Course  I have reviewed the triage vital signs and the nursing notes.  Pertinent labs & imaging results that were available during my care of the patient were reviewed by me and considered in my  medical decision making (see chart for details).  Clinical Course     Final Clinical Impressions(s) / ED Diagnoses  {I have reviewed and evaluated the relevant laboratory values.   {I have reviewed the relevant previous healthcare records.  {I obtained HPI from historian. {Patient discussed with supervising physician.  ED Course:  Assessment: Pt is a 35yF who presents with LUQ abdominal pain x3-4 weeks. Seen in past for same and told it was uterine fibroids due to pain originally in LLQ. [place] confirmed. On exam, pt in NAD. Nontoxic/nonseptic appearing. VSS. Afebrile. Lungs CTA. Heart RRR. Abdomen TTP LUQ. CBC showed Hgb 6.6 with previous being 7.1 3 weeks ago. Does have hx known ulcer. Pt also exhibiting Pica symptoms. Unsure type or what occurred. Notes  care at [facility]. Denies melena or hematochezia. POC Stool negative. Given fluids and protonix in ED. Christensen is to admit to medicine for observation. Anemia panel drawn. Likely IDA based on MCV. Unable to provide blood at [facility] and will need to transfer  Disposition/Christensen:  Admit Pt acknowledges and agrees with Christensen  Supervising Physician Leo Grosser, MD  Final diagnoses:  Anemia, unspecified type  LUQ pain    New Prescriptions New Prescriptions   No medications on file       Shary Decamp, PA-C 12/04/16 1855    Leo Grosser, MD 12/05/16 [phone]

## 2016-12-04 NOTE — ED Triage Notes (Signed)
C/o abd pain x 1 month-denies n/v/d, vaginal d/c and urinary s/s-requested preg test-NAD-steady gait

## 2016-12-05 ENCOUNTER — Encounter (HOSPITAL_COMMUNITY): Payer: Self-pay | Admitting: Internal Medicine

## 2016-12-05 ENCOUNTER — Observation Stay (HOSPITAL_COMMUNITY): Payer: Medicaid Other

## 2016-12-05 DIAGNOSIS — K5909 Other constipation: Secondary | ICD-10-CM | POA: Diagnosis present

## 2016-12-05 DIAGNOSIS — G8929 Other chronic pain: Secondary | ICD-10-CM | POA: Diagnosis present

## 2016-12-05 DIAGNOSIS — N920 Excessive and frequent menstruation with regular cycle: Secondary | ICD-10-CM | POA: Diagnosis present

## 2016-12-05 DIAGNOSIS — Z3202 Encounter for pregnancy test, result negative: Secondary | ICD-10-CM | POA: Diagnosis present

## 2016-12-05 DIAGNOSIS — D500 Iron deficiency anemia secondary to blood loss (chronic): Secondary | ICD-10-CM | POA: Diagnosis present

## 2016-12-05 DIAGNOSIS — R1012 Left upper quadrant pain: Secondary | ICD-10-CM | POA: Diagnosis present

## 2016-12-05 DIAGNOSIS — Z8249 Family history of ischemic heart disease and other diseases of the circulatory system: Secondary | ICD-10-CM | POA: Diagnosis not present

## 2016-12-05 DIAGNOSIS — F17210 Nicotine dependence, cigarettes, uncomplicated: Secondary | ICD-10-CM | POA: Diagnosis present

## 2016-12-05 DIAGNOSIS — K257 Chronic gastric ulcer without hemorrhage or perforation: Secondary | ICD-10-CM | POA: Diagnosis present

## 2016-12-05 DIAGNOSIS — E538 Deficiency of other specified B group vitamins: Secondary | ICD-10-CM | POA: Diagnosis present

## 2016-12-05 DIAGNOSIS — N83202 Unspecified ovarian cyst, left side: Secondary | ICD-10-CM | POA: Diagnosis present

## 2016-12-05 DIAGNOSIS — D259 Leiomyoma of uterus, unspecified: Secondary | ICD-10-CM | POA: Diagnosis present

## 2016-12-05 LAB — HEMOGLOBIN AND HEMATOCRIT, BLOOD
HCT: 25 % — ABNORMAL LOW (ref 36.0–46.0)
HCT: 27.9 % — ABNORMAL LOW (ref 36.0–46.0)
Hemoglobin: 7.7 g/dL — ABNORMAL LOW (ref 12.0–15.0)
Hemoglobin: 8.7 g/dL — ABNORMAL LOW (ref 12.0–15.0)

## 2016-12-05 LAB — PREPARE RBC (CROSSMATCH)

## 2016-12-05 LAB — ABO/RH: ABO/RH(D): O POS

## 2016-12-05 LAB — LACTIC ACID, PLASMA: Lactic Acid, Venous: 0.8 mmol/L (ref 0.5–1.9)

## 2016-12-05 MED ORDER — IOPAMIDOL (ISOVUE-300) INJECTION 61%
INTRAVENOUS | Status: AC
  Administered 2016-12-05: 100 mL
  Filled 2016-12-05: qty 100

## 2016-12-05 MED ORDER — SODIUM CHLORIDE 0.9 % IV SOLN: Freq: Once | INTRAVENOUS | Status: DC

## 2016-12-05 MED ORDER — FOLIC ACID 1 MG PO TABS
1.0000 mg | ORAL_TABLET | Freq: Every day | ORAL | Status: DC
  Administered 2016-12-05 – 2016-12-08 (×4): 1 mg via ORAL
  Filled 2016-12-05 (×4): qty 1

## 2016-12-05 MED ORDER — PANTOPRAZOLE SODIUM 40 MG IV SOLR
40.0000 mg | Freq: Two times a day (BID) | INTRAVENOUS | Status: DC
  Administered 2016-12-05 – 2016-12-06 (×2): 40 mg via INTRAVENOUS
  Filled 2016-12-05 (×2): qty 40

## 2016-12-05 MED ORDER — DIPHENHYDRAMINE HCL 50 MG/ML IJ SOLN
25.0000 mg | Freq: Once | INTRAMUSCULAR | Status: AC
  Administered 2016-12-05: 25 mg via INTRAVENOUS
  Filled 2016-12-05: qty 1

## 2016-12-05 MED ORDER — ACETAMINOPHEN 650 MG RE SUPP
650.0000 mg | Freq: Four times a day (QID) | RECTAL | Status: DC | PRN
Start: 2016-12-05 — End: 2016-12-08

## 2016-12-05 MED ORDER — ONDANSETRON HCL 4 MG PO TABS: 4.0000 mg | ORAL_TABLET | Freq: Four times a day (QID) | ORAL | Status: DC | PRN

## 2016-12-05 MED ORDER — WHITE PETROLATUM GEL
Status: AC
  Administered 2016-12-05: 19:00:00
  Filled 2016-12-05: qty 1

## 2016-12-05 MED ORDER — ACETAMINOPHEN 325 MG PO TABS
650.0000 mg | ORAL_TABLET | Freq: Four times a day (QID) | ORAL | Status: DC | PRN
  Administered 2016-12-05 (×2): 650 mg via ORAL
  Filled 2016-12-05 (×3): qty 2

## 2016-12-05 MED ORDER — FERROUS SULFATE 325 (65 FE) MG PO TABS
325.0000 mg | ORAL_TABLET | Freq: Two times a day (BID) | ORAL | Status: DC
  Administered 2016-12-05 – 2016-12-08 (×7): 325 mg via ORAL
  Filled 2016-12-05 (×7): qty 1

## 2016-12-05 MED ORDER — MORPHINE SULFATE (PF) 2 MG/ML IV SOLN
1.0000 mg | INTRAVENOUS | Status: DC | PRN
  Administered 2016-12-05 – 2016-12-07 (×8): 1 mg via INTRAVENOUS
  Filled 2016-12-05 (×8): qty 1

## 2016-12-05 MED ORDER — ONDANSETRON HCL 4 MG/2ML IJ SOLN: 4.0000 mg | Freq: Four times a day (QID) | INTRAMUSCULAR | Status: DC | PRN

## 2016-12-05 MED ORDER — TRAMADOL HCL 50 MG PO TABS
50.0000 mg | ORAL_TABLET | Freq: Four times a day (QID) | ORAL | Status: DC | PRN
  Administered 2016-12-05 – 2016-12-08 (×5): 50 mg via ORAL
  Filled 2016-12-05 (×5): qty 1

## 2016-12-05 MED ORDER — NICOTINE 14 MG/24HR TD PT24
14.0000 mg | MEDICATED_PATCH | Freq: Every day | TRANSDERMAL | Status: DC
  Administered 2016-12-05 – 2016-12-08 (×5): 14 mg via TRANSDERMAL
  Filled 2016-12-05 (×4): qty 1

## 2016-12-05 NOTE — H&P (Signed)
History and Physical    Tiffany Christensen [id number] DOB: 1981/12/09 DOA: 12/04/2016  PCP: No PCP Per Patient  Patient coming from: Home.  Chief Complaint: Left lower quadrant pain.  HPI: Tiffany Christensen is a 35 y.o. female with history of chronic anemia and menorrhagia uterine fibroids presents to the ER because of persistent pain in the left lower quadrant. Patient has had recent pelvic sonogram which showed uterine fibroids and ovarian cyst on the right side. Patient in the ER was found to have a hemoglobin around 6 which is a drop from previous. Patient states she has menorrhagia. Anemia panel also shows ferritin of 1. Patient is being admitted for transfusion.   ED Course: Labs show hemoglobin of around 6 with ferritin of 1. Stool for occult blood was negative.  Review of Systems: As per HPI, rest all negative.   Past Medical History:  Diagnosis Date  . Migraine   . Ovarian cyst   . Uterine fibroid     Past Surgical History:  Procedure Laterality Date  . BREAST SURGERY    . hemmorhoidectomy    . HEMORRHOID SURGERY    . HERNIA REPAIR       reports that she has been smoking Cigarettes.  She has been smoking about 0.50 packs per day. She has never used smokeless tobacco. She reports that she does not drink alcohol or use drugs.  Allergies  Allergen Reactions  . Codeine Itching    Family History  Problem Relation Age of Onset  . Hypertension Maternal Grandmother     Prior to Admission medications   Not on File    Physical Exam: Vitals:   12/04/16 2321 12/05/16 0046 12/05/16 0138 12/05/16 0227  BP: 123/82 113/75 104/73 (!) 111/52  Pulse: 87 80 85 89  Resp: 16 16 16 16   Temp:  98.3 F (36.8 C)  98 F (36.7 C)  TempSrc:  Oral  Oral  SpO2: 100% 98% 99% 100%  Weight:    61.8 kg (136 lb 4.8 oz)  Height:    5\' 5"  (1.651 m)      Constitutional: Moderately built and nourished. Vitals:   12/04/16 2321 12/05/16 0046 12/05/16 0138 12/05/16 0227  BP: 123/82  113/75 104/73 (!) 111/52  Pulse: 87 80 85 89  Resp: 16 16 16 16   Temp:  98.3 F (36.8 C)  98 F (36.7 C)  TempSrc:  Oral  Oral  SpO2: 100% 98% 99% 100%  Weight:    61.8 kg (136 lb 4.8 oz)  Height:    5\' 5"  (1.651 m)   Eyes: Anicteric mild pallor. ENMT: No discharge from the ears eyes nose and mouth. Neck: No mass felt. No neck rigidity. Respiratory: No rhonchi or crepitations. Cardiovascular: S1 and S2 heard. No murmurs appreciated. Abdomen: Soft nontender bowel sounds present. Musculoskeletal: No edema. No joint effusion. Skin: Appears pale. Neurologic: Alert awake oriented to time place and person. Moves all extremities. Psychiatric: Appears normal. Normal affect.   Labs on Admission: I have personally reviewed following labs and imaging studies  CBC:  Recent Labs Lab 12/04/16 1750  WBC 6.0  HGB 6.6*  HCT 22.2*  MCV 62.0*  PLT [id number]   Basic Metabolic Panel:  Recent Labs Lab 12/04/16 1750  NA 133*  K 3.7  CL 107  CO2 21*  GLUCOSE 96  BUN 6  CREATININE 0.53  CALCIUM 8.5*   GFR: Estimated Creatinine Clearance: 87.5 mL/min (by C-G formula based on SCr of 0.53 mg/dL).  Liver Function Tests:  Recent Labs Lab 12/04/16 1750  AST 15  ALT 7*  ALKPHOS 89  BILITOT 0.2*  PROT 6.9  ALBUMIN 3.5    Recent Labs Lab 12/04/16 1750  LIPASE 52*   No results for input(s): AMMONIA in the last 168 hours. Coagulation Profile: No results for input(s): INR, PROTIME in the last 168 hours. Cardiac Enzymes: No results for input(s): CKTOTAL, CKMB, CKMBINDEX, TROPONINI in the last 168 hours. BNP (last 3 results) No results for input(s): PROBNP in the last 8760 hours. HbA1C: No results for input(s): HGBA1C in the last 72 hours. CBG: No results for input(s): GLUCAP in the last 168 hours. Lipid Profile: No results for input(s): CHOL, HDL, LDLCALC, TRIG, CHOLHDL, LDLDIRECT in the last 72 hours. Thyroid Function Tests: No results for input(s): TSH, T4TOTAL, FREET4,  T3FREE, THYROIDAB in the last 72 hours. Anemia Panel:  Recent Labs  12/04/16 1750 12/04/16 1854  VITAMINB12  --  452  FOLATE  --  3.8*  FERRITIN  --  1*  TIBC  --  545*  IRON  --  11*  RETICCTPCT 1.8  --    Urine analysis:    Component Value Date/Time   COLORURINE YELLOW 12/04/2016 [place] 12/04/2016 1655   LABSPEC 1.014 12/04/2016 1655   PHURINE 5.5 12/04/2016 1655   GLUCOSEU NEGATIVE 12/04/2016 1655   HGBUR LARGE (A) 12/04/2016 1655   BILIRUBINUR NEGATIVE 12/04/2016 1655   KETONESUR NEGATIVE 12/04/2016 1655   PROTEINUR NEGATIVE 12/04/2016 1655   UROBILINOGEN 1.0 05/17/2015 2200   NITRITE NEGATIVE 12/04/2016 1655   LEUKOCYTESUR NEGATIVE 12/04/2016 1655   Sepsis Labs: @LABRCNTIP (procalcitonin:4,lacticidven:4) )No results found for this or any previous visit (from the past 240 hour(s)).   Radiological Exams on Admission: No results found.   Assessment/Plan Principal Problem:   Iron deficiency anemia due to chronic blood loss Active Problems:   Anemia    1. Severe iron deficiency anemia secondary to menorrhagia - I have ordered 1 unit of PRBC transfusion. Patient will need iron supplements. Advised to follow-up with gynecologist as outpatient for her menorrhagia. Patient is not in her menses at this time. 2. Left lower quadrant pain which patient states is chronic - patient is planning to follow-up with gynecologist. And is any nausea vomiting or diarrhea. Had recent pelvic ultrasound. Pregnancy negative. 3. Tobacco abuse - advised to quit smoking.   DVT prophylaxis: SCDs. Code Status: Full code.  Family Communication: Patient's brother.  Disposition Plan: Home.  Consults called: None.  Admission status: Observation.    Rise Patience MD [facility] Pager [phone].  If 7PM-7AM, please contact night-coverage [email or web address] Password [facility]  12/05/2016, 4:43 AM

## 2016-12-05 NOTE — ED Notes (Signed)
Ice cream given per pt request.

## 2016-12-05 NOTE — Progress Notes (Addendum)
Patient seen and examined.  She report chronic abdominal pain for last year. She presents because abdominal pain got worse.  She relates black stool for 2 days ago, small stool. She doesn't have regular bowel movement. She has had endoscopy and colonoscopy this year at [facility] center. She was diagnosed with ulcer. She also report heavy menstrual bleeding/   -1-Anemia; could be related to menorrhagia. Also will check occult blood and start protonix. Will check CT abdomen.  Will order another unit PRBC>   2-Abdominal pain acute on chronic, worse. Very tender on palpation. Check Lactic acid and CT abdomen- pelvis.

## 2016-12-06 ENCOUNTER — Inpatient Hospital Stay (HOSPITAL_COMMUNITY): Payer: Medicaid Other

## 2016-12-06 DIAGNOSIS — D500 Iron deficiency anemia secondary to blood loss (chronic): Principal | ICD-10-CM

## 2016-12-06 DIAGNOSIS — R109 Unspecified abdominal pain: Secondary | ICD-10-CM

## 2016-12-06 LAB — BASIC METABOLIC PANEL
Anion gap: 4 — ABNORMAL LOW (ref 5–15)
BUN: 7 mg/dL (ref 6–20)
CO2: 26 mmol/L (ref 22–32)
Calcium: 8.9 mg/dL (ref 8.9–10.3)
Chloride: 104 mmol/L (ref 101–111)
Creatinine, Ser: 0.58 mg/dL (ref 0.44–1.00)
GFR calc Af Amer: >60 mL/min (ref >60)
GFR calc non Af Amer: >60 mL/min (ref >60)
Glucose, Bld: 86 mg/dL (ref 65–99)
Potassium: 4.3 mmol/L (ref 3.5–5.1)
Sodium: 134 mmol/L — ABNORMAL LOW (ref 135–145)

## 2016-12-06 LAB — TYPE AND SCREEN
ABO/RH(D): O POS
Antibody Screen: NEGATIVE
Unit division: 0
Unit division: 0
Unit division: 0

## 2016-12-06 LAB — CBC
HCT: 28.6 % — ABNORMAL LOW (ref 36.0–46.0)
Hemoglobin: 9.1 g/dL — ABNORMAL LOW (ref 12.0–15.0)
MCH: 20.6 pg — ABNORMAL LOW (ref 26.0–34.0)
MCHC: 31.8 g/dL (ref 30.0–36.0)
MCV: 64.7 fL — ABNORMAL LOW (ref 78.0–100.0)
Platelets: 346 10*3/uL (ref 150–400)
RBC: 4.42 MIL/uL (ref 3.87–5.11)
RDW: 21.4 % — ABNORMAL HIGH (ref 11.5–15.5)
WBC: 6.5 10*3/uL (ref 4.0–10.5)

## 2016-12-06 LAB — OCCULT BLOOD X 1 CARD TO LAB, STOOL: Fecal Occult Bld: NEGATIVE

## 2016-12-06 MED ORDER — ALUM & MAG HYDROXIDE-SIMETH 200-200-20 MG/5ML PO SUSP
30.0000 mL | Freq: Four times a day (QID) | ORAL | Status: DC | PRN
  Administered 2016-12-06: 30 mL via ORAL
  Filled 2016-12-06: qty 30

## 2016-12-06 MED ORDER — PANTOPRAZOLE SODIUM 40 MG PO TBEC
40.0000 mg | DELAYED_RELEASE_TABLET | Freq: Two times a day (BID) | ORAL | Status: DC
  Administered 2016-12-06 – 2016-12-08 (×5): 40 mg via ORAL
  Filled 2016-12-06 (×5): qty 1

## 2016-12-06 MED ORDER — SODIUM CHLORIDE 0.9 % IV SOLN: INTRAVENOUS | Status: DC

## 2016-12-06 MED ORDER — FLEET ENEMA 7-19 GM/118ML RE ENEM
1.0000 | ENEMA | Freq: Once | RECTAL | Status: AC
  Administered 2016-12-06: 1 via RECTAL
  Filled 2016-12-06: qty 1

## 2016-12-06 NOTE — Consult Note (Signed)
[facility] Consult: 11:44 AM 12/06/2016  LOS: 1 day    Referring Provider: Dr Tyrell Antonio  Primary Care Physician:  [facility] Primary Gastroenterologist:  At [facility] in 2017.      Reason for Consultation:  Anemia, black stool, abdominal pain   HPI: Tiffany Christensen is a 35 y.o. female.  Past history of pelvic pain, fibroid uterus, uterine cysts. Status post vaginal delivery. She has undergone which she says is hemorrhoid surgery on 5 separate occasions, the last surgery was about 2 years ago.  10 years ago, during pregnancy, she was prescribed iron but had never continued on this and has never required blood transfusions. She is status post multiple breast lumpectomies dating back to her early teens, all of these were benign. Status post umbilical hernia repair and "freezing" of uterine cysts.  Gallstones, HIDA scan normal 07/2015.   Earlier in 2017 she was referred to [facility] GI specialist.  It is unclear why she was referred by her gynecologist to GI as the patient can't tell me but it seems that it was because of abdominal pain and perhaps because of anemia. She underwent upper endoscopy and she says she had colonoscopy twice. On the upper endoscopy with a found ulcers and she was prescribed a medication she cannot recall which she took until it ran out about 3 months ago, presumed this was a PPI. They also told her she had a bacteria in her stomach and gave her a 2 week course of antibiotics, presume this was for treatment of H. pylori.   Patient lost Medicare coverage so she hasn't been able to return to the GI specialist, unable to afford the office visit, so she comes to the emergency room for her issues.On 11/13/16 patient underwent pelvic ultrasound for pelvic pain.  Bilateral  ovarian cysts. Multiple uterine fibroids, these were possible source of the pain. Arterial and venous flow to the ovaries normal. On vaginal wet prep she had yeast, Trichomonas, clue cells and moderate WBCs. This was repeated a week later at which point the only finding was a few WBCs. Several physicians have told her they thought her pelvic pain was coming from the fibroids but her regular GYN, but Dr. Sharlet Salina in [facility], does not feel that these are the source of her symptoms.  Lower abdominal pain has persisted and is now predominantly in the left lower quadrant but is now radiating up into the upper abdomen. She's been taking ibuprofen 1600-2000 mg daily most days of the week for several weeks if not of few months. She's had nausea but not vomiting. Doesn't describe a lot of heartburn or reflux. No dysphagia. She is chronically constipated ever since her spinal hemorrhoid surgery. On average she has a decent bowel movement once a month and these are hard. She only takes laxatives maybe monthly if that so she is able to tolerate the constipation very well. Hasn't had any tarry or black stools. Hasn't passed any blood per rectum. No vomiting of blood. She has monthly menstrual periods at times  the bleeding is quite heavy but not always.  Periods last about 7 days.  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  Patient has microcytic anemia, hemoglobin 9.1 MCV 64.7. CT scan of abdomen pelvis with contrast showed uterine fibroids 2.6 cm left ovarian cyst.      Past Medical History:  Diagnosis Date  . Migraine   . Ovarian cyst   . Uterine fibroid     Past Surgical History:  Procedure Laterality Date  . BREAST SURGERY    . hemmorhoidectomy    . HEMORRHOID SURGERY    . HERNIA REPAIR      Prior to Admission medications   Not on File    Scheduled Meds: . sodium  chloride   Intravenous Once  . sodium chloride   Intravenous Once  . ferrous sulfate  325 mg Oral BID WC  . folic acid  1 mg Oral Daily  . nicotine  14 mg Transdermal Daily  . pantoprazole (PROTONIX) IV  40 mg Intravenous Q12H   Infusions:  PRN Meds: acetaminophen **OR** acetaminophen, alum & mag hydroxide-simeth, morphine injection, ondansetron **OR** ondansetron (ZOFRAN) IV, traMADol   Allergies as of 12/04/2016 - Review Complete 12/04/2016  Allergen Reaction Noted  . Codeine Itching 03/18/2012    Family History  Problem Relation Age of Onset  . Hypertension Maternal Grandmother     Social History   Social History  . Marital status: Single    Spouse name: N/A  . Number of children: N/A  . Years of education: N/A   Occupational History  . Not on file.   Social History Main Topics  . Smoking status: Current Every Day Smoker    Packs/day: 0.50    Types: Cigarettes  . Smokeless tobacco: Never Used  . Alcohol use No  . Drug use: No  . Sexual activity: Yes    Birth control/ protection: None   Other Topics Concern  . Not on file   Social History Narrative  . No narrative on file    REVIEW OF SYSTEMS: Constitutional:  No fatigue or weakness. ENT:  No nose bleeds Pulm:  No shortness of breath or cough. CV:  No palpitations, no LE edema.  GU:  No hematuria, no frequency GI:  Per HPI Heme:  No unusual bleeding or bruising tendency.   Transfusions:  Patient was transfused with PRBC 2 overnight, this is the first time she's ever received blood transfusions. Neuro:  No headaches, no peripheral tingling or numbness Derm:  No itching, no rash or sores.  Endocrine:  No sweats or chills.  No polyuria or dysuria Immunization:  Did not inquire with her as to recent or previous vaccinations. Travel:  None beyond local counties in last few months.    PHYSICAL EXAM: Vital signs in last 24 hours: Vitals:   12/06/16 0307 12/06/16 0520  BP: 119/75 120/75  Pulse: 80 76   Resp: 20 18  Temp: 98.5 F (36.9 C) 98 F (36.7 C)   Wt Readings from Last 3 Encounters:  12/05/16 61.8 kg (136 lb 4.8 oz)  11/13/16 63.5 kg (140 lb)  11/07/16 63.5 kg (140 lb)    General: Pleasant, comfortable AAF. She does not look ill. Head:  No facial asymmetry or swelling  Eyes:  No scleral icterus or conjunctival pallor Ears:  Not HOH.  Nose:  No congestion  or discharge Mouth:  Moist and clear oral mucosa. Good dentition. Neck:  No JVD or masses. No TMG Lungs:  Clear bilaterally. No labored breathing or cough. Heart: RRR. No MRG. S1, S2 present. Abdomen:  Soft. Not distended. She has pain when she tries to sit up or lay back. She has tenderness to light touch in the lower abdomen especially on the left and up into the mid left. No guarding or rebound. Bowel sounds hypoactive. No masses or hepatosplenomegaly..   Rectal: Deferred   Musc/Skeltl: No joint deformity or swelling. Extremities:  No CCE.  Neurologic:  Oriented 3. Fully alert. Cell 4 limbs and strength is full. No tremor. No gross deficits Skin:  No rashes or sores Nodes:  No cervical adenopathy   Psych:  Cooperative, pleasant, calm.  Intake/Output from previous day: 12/27 0701 - 12/28 0700 In: 1870 [P.O.:1200; Blood:670] Out: 2550 [Urine:2550] Intake/Output this shift: Total I/O In: 120 [P.O.:120] Out: -   LAB RESULTS:  Recent Labs  12/04/16 1750 12/05/16 1306 12/05/16 1900 12/06/16 0741  WBC 6.0  --   --  6.5  HGB 6.6* 7.7* 8.7* 9.1*  HCT 22.2* 25.0* 27.9* 28.6*  PLT 378  --   --  346   BMET Lab Results  Component Value Date   NA 134 (L) 12/06/2016   NA 133 (L) 12/04/2016   NA 136 11/13/2016   K 4.3 12/06/2016   K 3.7 12/04/2016   K 3.5 11/13/2016   CL 104 12/06/2016   CL 107 12/04/2016   CL 111 11/13/2016   CO2 26 12/06/2016   CO2 21 (L) 12/04/2016   CO2 21 (L) 11/13/2016   GLUCOSE 86 12/06/2016   GLUCOSE 96 12/04/2016   GLUCOSE 99 11/13/2016   BUN 7 12/06/2016   BUN 6  12/04/2016   BUN 6 11/13/2016   CREATININE 0.58 12/06/2016   CREATININE 0.53 12/04/2016   CREATININE 0.49 11/13/2016   CALCIUM 8.9 12/06/2016   CALCIUM 8.5 (L) 12/04/2016   CALCIUM 8.9 11/13/2016   LFT  Recent Labs  12/04/16 1750  PROT 6.9  ALBUMIN 3.5  AST 15  ALT 7*  ALKPHOS 89  BILITOT 0.2*   PT/INR No results found for: INR, PROTIME Hepatitis Panel No results for input(s): HEPBSAG, HCVAB, HEPAIGM, HEPBIGM in the last 72 hours. C-Diff No components found for: CDIFF Lipase     Component Value Date/Time   LIPASE 52 (H) 12/04/2016 1750    Drugs of Abuse  No results found for: LABOPIA, COCAINSCRNUR, LABBENZ, AMPHETMU, THCU, LABBARB   RADIOLOGY STUDIES: Ct Abdomen Pelvis W Contrast  Result Date: 12/05/2016 CLINICAL DATA:  Left upper quadrant abdominal pain for 2 weeks. EXAM: CT ABDOMEN AND PELVIS WITH CONTRAST TECHNIQUE: Multidetector CT imaging of the abdomen and pelvis was performed using the standard protocol following bolus administration of intravenous contrast. CONTRAST:  133mL ISOVUE-300 IOPAMIDOL (ISOVUE-300) INJECTION 61% COMPARISON:  CT scan of May 18, 2015. FINDINGS: Lower chest: Mild bilateral posterior basilar subsegmental atelectasis is noted. Hepatobiliary: No focal liver abnormality is seen. No gallstones, gallbladder wall thickening, or biliary dilatation. Pancreas: Unremarkable. No pancreatic ductal dilatation or surrounding inflammatory changes. Spleen: Normal in size without focal abnormality. Adrenals/Urinary Tract: Adrenal glands are unremarkable. Kidneys are normal, without renal calculi, focal lesion, or hydronephrosis. Bladder is unremarkable. Stomach/Bowel: Stomach is within normal limits. Appendix appears normal. No evidence of bowel wall thickening, distention, or inflammatory changes. Vascular/Lymphatic: No significant vascular findings are present. No enlarged abdominal or pelvic lymph nodes. Reproductive: Fibroid  uterus is again noted. 2.6 cm left  ovarian cyst is noted. Other: No abdominal wall hernia or abnormality. No abdominopelvic ascites. Musculoskeletal: No acute or significant osseous findings. IMPRESSION: Fibroid uterus. 2.6 cm left ovarian cyst. No other definite abnormality seen in the abdomen or pelvis. Electronically Signed   By: Marijo Conception, M.D.   On: 12/05/2016 21:24      IMPRESSION:   *  Chronic pelvic pain, now with left lower quadrant pain and tenderness. CT scan unrevealing. Previous providers have felt that her uterine fibroids with the source of her discomfort. Also has bilateral ovarian cyst but no evidence for cyst rupture.  *  Microcytic anemia. Has undergone upper endoscopy as well as colonoscopies within the past 12 months. Faxed over request for release of procedure information to [facility]. Hopefully we can get these records and review.  *  Ulcers reported on EGD earlier this year.  Stopped medical therapy at least 3 months ago. Taking lots of ibuprofen, which was the case earlier this year. Suspect recurrent ulcers.  She is receiving twice daily IV Protonix.  *  Status post multiple hemorrhoidectomies versus hemorrhoidal banding.  Is not having any rectal bleeding/hematochezia.  FOBT negative on 12/04/16.  *  Chronic constipation. Surprising that she is able to tolerate having a bowel movement only once per month. Wonder if constipation is contributing to her pain.   PLAN:     *  Reviewed GI procedural records from [place].  May need to repeat upper endoscopy, will discuss with Dr Loletha Carrow.  *  Going to switch the IV Protonix to oral but continue it twice daily. Given her severe constipation, not sure that adding twice daily oral iron is the best strategy. Would ask pharmacy to dose parenteral iron   Azucena Freed  12/06/2016, 11:44 AM Pager: [phone]

## 2016-12-06 NOTE — Progress Notes (Signed)
PROGRESS NOTE    PRISCYLLA MENIFEE  [id number] DOB: 1981/10/06 DOA: 12/04/2016 PCP: No PCP Per Patient    Brief Narrative: FLORICE PHILIPP is a 35 y.o. female with history of chronic anemia and menorrhagia uterine fibroids presents to the ER because of persistent pain in the left lower quadrant. Patient has had recent pelvic sonogram which showed uterine fibroids and ovarian cyst on the right side. Patient in the ER was found to have a hemoglobin around 6 which is a drop from previous. Patient states she has menorrhagia. Anemia panel also shows ferritin of 1. Patient is being admitted for transfusion.   ED Course: Labs show hemoglobin of around 6 with ferritin of 1. Stool for occult blood was negative.   Assessment & Plan:   Principal Problem:   Iron deficiency anemia due to chronic blood loss Active Problems:   Anemia  -1-Anemia; iron deficiency; could be related to menorrhagia, vs peptic ulcer. Continue with protonix. CT abdomen; fibroid and left ovarian cyst.  Received 2 units PRBC. Hb at 9.  Started on ferrous sulfate.  will ask nurse to gets records from [facility]  2-Abdominal pain acute on chronic, worse. Complaints of epigastric pain, report prior history of peptic ulcer.  GI consult.  Continue with PPI.  CT abdomen showed uterine fibroids and left  ovarian cyst.   3-Constipation; fleet enema ordered.   4-Ovarian cyst; repeat transvaginal US. On CT left ovarian cyst on [place] was on rigth/     DVT prophylaxis: SCD Code Status: full code.  Family Communication: care discussed with patient.  Disposition Plan: GI consult    Consultants:   GI consult.    Procedures:   none   Antimicrobials: none   Subjective: Had a rough night. Complaining of severe epigastric pain.    Objective: Vitals:   12/05/16 1654 12/05/16 2136 12/06/16 0307 12/06/16 0520  BP: 118/77 118/83 119/75 120/75  Pulse: 88 79 80 76  Resp: 18 18 20 18   Temp: 98.6 F (37 C) 97.9  F (36.6 C) 98.5 F (36.9 C) 98 F (36.7 C)  TempSrc: Oral Oral Oral Oral  SpO2: 100% 100% 98% 100%  Weight:      Height:        Intake/Output Summary (Last 24 hours) at 12/06/16 0923 Last data filed at 12/06/16 0856  Gross per 24 hour  Intake             1750 ml  Output             2050 ml  Net             -300 ml   Filed Weights   12/04/16 1644 12/05/16 0227  Weight: 60.8 kg (134 lb) 61.8 kg (136 lb 4.8 oz)    Examination:  General exam: Appears calm and comfortable  Respiratory system: Clear to auscultation. Respiratory effort normal. Cardiovascular system: S1 & S2 heard, RRR. No JVD, murmurs, rubs, gallops or clicks. No pedal edema. Gastrointestinal system: Abdomen is nondistended, soft and nontender. No organomegaly or masses felt. Normal bowel sounds heard. Central nervous system: Alert and oriented. No focal neurological deficits. Extremities: Symmetric 5 x 5 power. Skin: No rashes, lesions or ulcers Psychiatry: Judgement and insight appear normal. Mood & affect appropriate.     Data Reviewed: I have personally reviewed following labs and imaging studies  CBC:  Recent Labs Lab 12/04/16 1750 12/05/16 1306 12/05/16 1900 12/06/16 0741  WBC 6.0  --   --  6.5  HGB 6.6* 7.7* 8.7* 9.1*  HCT 22.2* 25.0* 27.9* 28.6*  MCV 62.0*  --   --  64.7*  PLT 378  --   --  123456   Basic Metabolic Panel:  Recent Labs Lab 12/04/16 1750  NA 133*  K 3.7  CL 107  CO2 21*  GLUCOSE 96  BUN 6  CREATININE 0.53  CALCIUM 8.5*   GFR: Estimated Creatinine Clearance: 87.5 mL/min (by C-G formula based on SCr of 0.53 mg/dL). Liver Function Tests:  Recent Labs Lab 12/04/16 1750  AST 15  ALT 7*  ALKPHOS 89  BILITOT 0.2*  PROT 6.9  ALBUMIN 3.5    Recent Labs Lab 12/04/16 1750  LIPASE 52*   No results for input(s): AMMONIA in the last 168 hours. Coagulation Profile: No results for input(s): INR, PROTIME in the last 168 hours. Cardiac Enzymes: No results for  input(s): CKTOTAL, CKMB, CKMBINDEX, TROPONINI in the last 168 hours. BNP (last 3 results) No results for input(s): PROBNP in the last 8760 hours. HbA1C: No results for input(s): HGBA1C in the last 72 hours. CBG: No results for input(s): GLUCAP in the last 168 hours. Lipid Profile: No results for input(s): CHOL, HDL, LDLCALC, TRIG, CHOLHDL, LDLDIRECT in the last 72 hours. Thyroid Function Tests: No results for input(s): TSH, T4TOTAL, FREET4, T3FREE, THYROIDAB in the last 72 hours. Anemia Panel:  Recent Labs  12/04/16 1750 12/04/16 1854  VITAMINB12  --  452  FOLATE  --  3.8*  FERRITIN  --  1*  TIBC  --  545*  IRON  --  11*  RETICCTPCT 1.8  --    Sepsis Labs:  Recent Labs Lab 12/05/16 1803  LATICACIDVEN 0.8    No results found for this or any previous visit (from the past 240 hour(s)).       Radiology Studies: Ct Abdomen Pelvis W Contrast  Result Date: 12/05/2016 CLINICAL DATA:  Left upper quadrant abdominal pain for 2 weeks. EXAM: CT ABDOMEN AND PELVIS WITH CONTRAST TECHNIQUE: Multidetector CT imaging of the abdomen and pelvis was performed using the standard protocol following bolus administration of intravenous contrast. CONTRAST:  141mL ISOVUE-300 IOPAMIDOL (ISOVUE-300) INJECTION 61% COMPARISON:  CT scan of May 18, 2015. FINDINGS: Lower chest: Mild bilateral posterior basilar subsegmental atelectasis is noted. Hepatobiliary: No focal liver abnormality is seen. No gallstones, gallbladder wall thickening, or biliary dilatation. Pancreas: Unremarkable. No pancreatic ductal dilatation or surrounding inflammatory changes. Spleen: Normal in size without focal abnormality. Adrenals/Urinary Tract: Adrenal glands are unremarkable. Kidneys are normal, without renal calculi, focal lesion, or hydronephrosis. Bladder is unremarkable. Stomach/Bowel: Stomach is within normal limits. Appendix appears normal. No evidence of bowel wall thickening, distention, or inflammatory changes.  Vascular/Lymphatic: No significant vascular findings are present. No enlarged abdominal or pelvic lymph nodes. Reproductive: Fibroid uterus is again noted. 2.6 cm left ovarian cyst is noted. Other: No abdominal wall hernia or abnormality. No abdominopelvic ascites. Musculoskeletal: No acute or significant osseous findings. IMPRESSION: Fibroid uterus. 2.6 cm left ovarian cyst. No other definite abnormality seen in the abdomen or pelvis. Electronically Signed   By: Marijo Conception, M.D.   On: 12/05/2016 21:24        Scheduled Meds: . sodium chloride   Intravenous Once  . sodium chloride   Intravenous Once  . ferrous sulfate  325 mg Oral BID WC  . folic acid  1 mg Oral Daily  . nicotine  14 mg Transdermal Daily  . pantoprazole (PROTONIX) IV  40 mg  Intravenous Q12H  . sodium phosphate  1 enema Rectal Once   Continuous Infusions:   LOS: 1 day    Time spent: 35 minutes.     Elmarie Shiley, MD [facility] Pager [phone]  If 7PM-7AM, please contact night-coverage [email or web address] Password [facility] 12/06/2016, 9:23 AM

## 2016-12-07 ENCOUNTER — Encounter (HOSPITAL_COMMUNITY): Payer: Self-pay

## 2016-12-07 ENCOUNTER — Inpatient Hospital Stay (HOSPITAL_COMMUNITY): Payer: Medicaid Other | Admitting: Certified Registered Nurse Anesthetist

## 2016-12-07 ENCOUNTER — Encounter (HOSPITAL_COMMUNITY)
Admission: EM | Disposition: A | Discharge status: Home / Self Care | Payer: Self-pay | Source: Home / Self Care | Attending: Internal Medicine

## 2016-12-07 DIAGNOSIS — K257 Chronic gastric ulcer without hemorrhage or perforation: Secondary | ICD-10-CM

## 2016-12-07 HISTORY — PX: ESOPHAGOGASTRODUODENOSCOPY: SHX5428

## 2016-12-07 LAB — HEMOGLOBIN AND HEMATOCRIT, BLOOD
HCT: 29.4 % — ABNORMAL LOW (ref 36.0–46.0)
Hemoglobin: 9.1 g/dL — ABNORMAL LOW (ref 12.0–15.0)

## 2016-12-07 SURGERY — EGD (ESOPHAGOGASTRODUODENOSCOPY)
Anesthesia: Monitor Anesthesia Care

## 2016-12-07 MED ORDER — LIDOCAINE HCL (CARDIAC) 20 MG/ML IV SOLN
INTRAVENOUS | Status: DC | PRN
  Administered 2016-12-07: 40 mg via INTRATRACHEAL
  Administered 2016-12-07: 60 mg via INTRATRACHEAL

## 2016-12-07 MED ORDER — PROPOFOL 10 MG/ML IV BOLUS
INTRAVENOUS | Status: DC | PRN
  Administered 2016-12-07: 50 mg via INTRAVENOUS
  Administered 2016-12-07 (×2): 20 mg via INTRAVENOUS
  Administered 2016-12-07: 10 mg via INTRAVENOUS
  Administered 2016-12-07 (×2): 20 mg via INTRAVENOUS

## 2016-12-07 MED ORDER — MORPHINE SULFATE (PF) 2 MG/ML IV SOLN
1.0000 mg | Freq: Three times a day (TID) | INTRAVENOUS | Status: DC | PRN
  Administered 2016-12-07 – 2016-12-08 (×3): 1 mg via INTRAVENOUS
  Filled 2016-12-07 (×4): qty 1

## 2016-12-07 MED ORDER — LACTATED RINGERS IV SOLN
INTRAVENOUS | Status: DC
Start: 2016-12-07 — End: 2016-12-07
  Administered 2016-12-07: 1000 mL via INTRAVENOUS

## 2016-12-07 NOTE — Transfer of Care (Signed)
Immediate Anesthesia Transfer of Care Note  Patient: Lavonia Drafts  Procedure(s) Performed: Procedure(s): ESOPHAGOGASTRODUODENOSCOPY (EGD) (N/A)  Patient Location: Endoscopy Unit  Anesthesia Type:MAC  Level of Consciousness: awake, alert  and oriented  Airway & Oxygen Therapy: Patient Spontanous Breathing  Post-op Assessment: Report given to RN and Post -op Vital signs reviewed and stable  Post vital signs: Reviewed and stable  Last Vitals:  Vitals:   12/07/16 0508 12/07/16 0723  BP: 118/71 120/81  Pulse: 76 73  Resp: 17 15  Temp: 36.6 C 36.8 C    Last Pain:  Vitals:   12/07/16 0723  TempSrc: Oral  PainSc: 5          Complications: No apparent anesthesia complications

## 2016-12-07 NOTE — Anesthesia Preprocedure Evaluation (Signed)
Anesthesia Evaluation  Patient identified by MRN, date of birth, ID band Patient awake    Reviewed: Allergy & Precautions, NPO status , Patient's Chart, lab work & pertinent test results  Airway Mallampati: II       Dental   Pulmonary Current Smoker,    breath sounds clear to auscultation       Cardiovascular negative cardio ROS   Rhythm:Regular Rate:Normal     Neuro/Psych  Headaches,    GI/Hepatic Neg liver ROS, PUD,   Endo/Other  negative endocrine ROS  Renal/GU negative Renal ROS     Musculoskeletal   Abdominal   Peds  Hematology negative hematology ROS (+) anemia ,   Anesthesia Other Findings   Reproductive/Obstetrics                             Anesthesia Physical Anesthesia Plan  ASA: III  Anesthesia Plan: MAC   Post-op Pain Management:    Induction: Intravenous  Airway Management Planned: Simple Face Mask  Additional Equipment:   Intra-op Plan:   Post-operative Plan:   Informed Consent: I have reviewed the patients History and Physical, chart, labs and discussed the procedure including the risks, benefits and alternatives for the proposed anesthesia with the patient or authorized representative who has indicated his/her understanding and acceptance.   Dental advisory given  Plan Discussed with: CRNA and Anesthesiologist  Anesthesia Plan Comments:         Anesthesia Quick Evaluation

## 2016-12-07 NOTE — Interval H&P Note (Signed)
History and Physical Interval Note:  12/07/2016 7:50 AM  Tiffany Christensen  has presented today for surgery, with the diagnosis of epigastric pain, iron deficiency anemia  The various methods of treatment have been discussed with the patient and family. After consideration of risks, benefits and other options for treatment, the patient has consented to  Procedure(s): ESOPHAGOGASTRODUODENOSCOPY (EGD) (N/A) as a surgical intervention .  The patient's history has been reviewed, patient examined, no change in status, stable for surgery.  I have reviewed the patient's chart and labs.  Questions were answered to the patient's satisfaction.     Nelida Meuse III

## 2016-12-07 NOTE — Progress Notes (Signed)
PROGRESS NOTE    [name]  [id number] DOB: [birth year] DOA: 12/04/2016 PCP: No PCP Per Patient    Brief Narrative: Tiffany Christensen is a 35 y.o. female with history of chronic anemia and menorrhagia uterine fibroids presents to the ER because of persistent pain in the left lower quadrant. Patient has had recent pelvic sonogram which showed uterine fibroids and ovarian cyst on the right side. Patient in the ER was found to have a hemoglobin around 6 which is a drop from previous. Patient states she has menorrhagia. Anemia panel also shows ferritin of 1. Patient is being admitted for transfusion.   ED Course: Labs show hemoglobin of around 6 with ferritin of 1. Stool for occult blood was negative. GI consulted, underwent endoscopy which showed small gastric ulcer, unlikely source of bleeding. Patient anemia might be related to menorrhagia.  Patient relates that she started to have her menstrual period today    Assessment & Plan:   Principal Problem:   Iron deficiency anemia due to chronic blood loss Active Problems:   Anemia   Left sided abdominal pain   Chronic gastric ulcer without hemorrhage and without perforation  -1-Anemia; iron deficiency; could be related to menorrhagia, vs peptic ulcer. Continue with protonix. CT abdomen; fibroid and left ovarian cyst.  Received 2 units PRBC. Hb at 9.  Started on ferrous sulfate.  Report vaginal bleeding today. Monitor and repeat hb in am.   2-Abdominal pain acute on chronic, worse. Complaints of epigastric pain, report prior history of peptic ulcer.  GI consult.  Continue with PPI.  CT abdomen showed uterine fibroids and left  ovarian cyst.  Underwent endoscopy, which showed  gastric ulcers, unlikely cause of bleeding.  Patient needs to follow up with GYN for chronic pain and fibroids. Tiffany Christensen   3-Constipation; fleet enema ordered. Bowel regimen.   4-Left Ovarian cyst; needs to follow up with GYN/  Repeated vaginal US showed  resolution of right ovarian cyst and a new left ovarian cyst.     DVT prophylaxis: SCD Code Status: full code.  Family Communication: care discussed with patient.  Disposition Plan: GI consult    Consultants:   GI consult.    Procedures:   none   Antimicrobials: none   Subjective: Just came from endoscopy.  Report vaginal bleeding.    Objective: Vitals:   12/07/16 0813 12/07/16 0815 12/07/16 0820 12/07/16 1402  BP: (!) 96/45 (!) 105/56 (!) 99/50 133/74  Pulse: 86 81 91 70  Resp: 19 19 19    Temp: 98 F (36.7 C)   98.6 F (37 C)  TempSrc: Oral   Oral  SpO2: 100% 100% 100% 99%  Weight:      Height:        Intake/Output Summary (Last 24 hours) at 12/07/16 1450 Last data filed at 12/07/16 1107  Gross per 24 hour  Intake              920 ml  Output             2150 ml  Net            -1230 ml   Filed Weights   12/04/16 1644 12/05/16 0227  Weight: 60.8 kg (134 lb) 61.8 kg (136 lb 4.8 oz)    Examination:  General exam: Appears calm and comfortable  Respiratory system: Clear to auscultation. Respiratory effort normal. Cardiovascular system: S1 & S2 heard, RRR. No JVD, murmurs, rubs, gallops or clicks. No pedal edema. Gastrointestinal system:  Abdomen is nondistended, soft and nontender. No organomegaly or masses felt. Normal bowel sounds heard. Central nervous system: Alert and oriented. No focal neurological deficits. Extremities: Symmetric 5 x 5 power. Skin: No rashes, lesions or ulcers Psychiatry: Judgement and insight appear normal. Mood & affect appropriate.     Data Reviewed: I have personally reviewed following labs and imaging studies  CBC:  Recent Labs Lab 12/04/16 1750 12/05/16 1306 12/05/16 1900 12/06/16 0741 12/07/16 0839  WBC 6.0  --   --  6.5  --   HGB 6.6* 7.7* 8.7* 9.1* 9.1*  HCT 22.2* 25.0* 27.9* 28.6* 29.4*  MCV 62.0*  --   --  64.7*  --   PLT 378  --   --  346  --    Basic Metabolic Panel:  Recent Labs Lab 12/04/16 1750  12/06/16 1000  NA 133* 134*  K 3.7 4.3  CL 107 104  CO2 21* 26  GLUCOSE 96 86  BUN 6 7  CREATININE 0.53 0.58  CALCIUM 8.5* 8.9   GFR: Estimated Creatinine Clearance: 87.5 mL/min (by C-G formula based on SCr of 0.58 mg/dL). Liver Function Tests:  Recent Labs Lab 12/04/16 1750  AST 15  ALT 7*  ALKPHOS 89  BILITOT 0.2*  PROT 6.9  ALBUMIN 3.5    Recent Labs Lab 12/04/16 1750  LIPASE 52*   No results for input(s): AMMONIA in the last 168 hours. Coagulation Profile: No results for input(s): INR, PROTIME in the last 168 hours. Cardiac Enzymes: No results for input(s): CKTOTAL, CKMB, CKMBINDEX, TROPONINI in the last 168 hours. BNP (last 3 results) No results for input(s): PROBNP in the last 8760 hours. HbA1C: No results for input(s): HGBA1C in the last 72 hours. CBG: No results for input(s): GLUCAP in the last 168 hours. Lipid Profile: No results for input(s): CHOL, HDL, LDLCALC, TRIG, CHOLHDL, LDLDIRECT in the last 72 hours. Thyroid Function Tests: No results for input(s): TSH, T4TOTAL, FREET4, T3FREE, THYROIDAB in the last 72 hours. Anemia Panel:  Recent Labs  12/04/16 1750 12/04/16 1854  VITAMINB12  --  452  FOLATE  --  3.8*  FERRITIN  --  1*  TIBC  --  545*  IRON  --  11*  RETICCTPCT 1.8  --    Sepsis Labs:  Recent Labs Lab 12/05/16 1803  LATICACIDVEN 0.8    No results found for this or any previous visit (from the past 240 hour(s)).       Radiology Studies: US Transvaginal Non-ob  Result Date: 12/06/2016 CLINICAL DATA:  35 year old female with a history of uterine fibroids and left adnexal cyst on CT study from 1 day prior presents with 2 days of left abdominal pain. LMP 11/08/2016. EXAM: TRANSABDOMINAL AND TRANSVAGINAL ULTRASOUND OF PELVIS TECHNIQUE: Both transabdominal and transvaginal ultrasound examinations of the pelvis were performed. Transabdominal technique was performed for global imaging of the pelvis including uterus, ovaries,  adnexal regions, and pelvic cul-de-sac. It was necessary to proceed with endovaginal exam following the transabdominal exam to visualize the endometrium and adnexa. COMPARISON:  11/13/2016 pelvic sonogram. 12/05/2016 CT abdomen/pelvis. FINDINGS: Uterus Measurements: 10.1 x 6.9 x 7.0 cm. The anteverted uterus is enlarged by multiple fibroids, with representative fibroids as follows: - right anterior fundal intramural 3.8 x 3.2 x 3.2 cm fibroid, previously 3.7 x 3.1 x 3.4 cm on 11/13/2016 using similar measurement technique, not appreciably changed - left uterine body intramural 2.8 x 2.3 x 3.2 cm fibroid, previously 2.4 x 2.4 x 2.9 cm on 11/13/2016,  not appreciably changed allowing for differences in measurement technique - posterior right upper uterine body intramural 2.9 x 2.1 x 2.5 cm fibroid, previously 2.7 x 2.1 x 2.6 cm on 11/13/2016, not appreciably changed Endometrium Thickness: 13 mm. No endometrial cavity fluid or focal endometrial mass demonstrated. Right ovary Measurements: 3.7 x 2.1 x 2.8 cm. Normal appearance/no adnexal mass. Resolution of the previously demonstrated right ovarian cyst on the 11/13/2016 sonogram. Left ovary Measurements: 5.2 x 3.3 x 4.0 cm (transabdominal measurement). Simple 2.7 x 2.4 x 2.5 cm left ovarian follicular cyst, new since 11/13/2016. No suspicious left ovarian or left adnexal masses. Other findings No abnormal free fluid. IMPRESSION: 1. Stable enlarged myomatous uterus. 2. No suspicious ovarian or adnexal findings. Previously described right ovarian cyst on the 11/13/2016 sonogram has resolved. New simple 2.7 cm left ovarian follicular cyst, for which no further follow-up is recommended. This recommendation follows the consensus statement: Management of Asymptomatic Ovarian and Other Adnexal Cysts Imaged at [place]: Society of Radiologists in [place]. Radiology 2010; [phone]. Electronically Signed   By: Ilona Sorrel M.D.   On: 12/06/2016  20:45   US Pelvis Complete  Result Date: 12/06/2016 CLINICAL DATA:  35 year old female with a history of uterine fibroids and left adnexal cyst on CT study from 1 day prior presents with 2 days of left abdominal pain. LMP 11/08/2016. EXAM: TRANSABDOMINAL AND TRANSVAGINAL ULTRASOUND OF PELVIS TECHNIQUE: Both transabdominal and transvaginal ultrasound examinations of the pelvis were performed. Transabdominal technique was performed for global imaging of the pelvis including uterus, ovaries, adnexal regions, and pelvic cul-de-sac. It was necessary to proceed with endovaginal exam following the transabdominal exam to visualize the endometrium and adnexa. COMPARISON:  11/13/2016 pelvic sonogram. 12/05/2016 CT abdomen/pelvis. FINDINGS: Uterus Measurements: 10.1 x 6.9 x 7.0 cm. The anteverted uterus is enlarged by multiple fibroids, with representative fibroids as follows: - right anterior fundal intramural 3.8 x 3.2 x 3.2 cm fibroid, previously 3.7 x 3.1 x 3.4 cm on 11/13/2016 using similar measurement technique, not appreciably changed - left uterine body intramural 2.8 x 2.3 x 3.2 cm fibroid, previously 2.4 x 2.4 x 2.9 cm on 11/13/2016, not appreciably changed allowing for differences in measurement technique - posterior right upper uterine body intramural 2.9 x 2.1 x 2.5 cm fibroid, previously 2.7 x 2.1 x 2.6 cm on 11/13/2016, not appreciably changed Endometrium Thickness: 13 mm. No endometrial cavity fluid or focal endometrial mass demonstrated. Right ovary Measurements: 3.7 x 2.1 x 2.8 cm. Normal appearance/no adnexal mass. Resolution of the previously demonstrated right ovarian cyst on the 11/13/2016 sonogram. Left ovary Measurements: 5.2 x 3.3 x 4.0 cm (transabdominal measurement). Simple 2.7 x 2.4 x 2.5 cm left ovarian follicular cyst, new since 11/13/2016. No suspicious left ovarian or left adnexal masses. Other findings No abnormal free fluid. IMPRESSION: 1. Stable enlarged myomatous uterus. 2. No  suspicious ovarian or adnexal findings. Previously described right ovarian cyst on the 11/13/2016 sonogram has resolved. New simple 2.7 cm left ovarian follicular cyst, for which no further follow-up is recommended. This recommendation follows the consensus statement: Management of Asymptomatic Ovarian and Other Adnexal Cysts Imaged at [place]: Society of Radiologists in [place]. Radiology 2010; [phone]. Electronically Signed   By: Ilona Sorrel M.D.   On: 12/06/2016 20:45   Ct Abdomen Pelvis W Contrast  Result Date: 12/05/2016 CLINICAL DATA:  Left upper quadrant abdominal pain for 2 weeks. EXAM: CT ABDOMEN AND PELVIS WITH CONTRAST TECHNIQUE: Multidetector CT imaging of the abdomen and  pelvis was performed using the standard protocol following bolus administration of intravenous contrast. CONTRAST:  166mL ISOVUE-300 IOPAMIDOL (ISOVUE-300) INJECTION 61% COMPARISON:  CT scan of May 18, 2015. FINDINGS: Lower chest: Mild bilateral posterior basilar subsegmental atelectasis is noted. Hepatobiliary: No focal liver abnormality is seen. No gallstones, gallbladder wall thickening, or biliary dilatation. Pancreas: Unremarkable. No pancreatic ductal dilatation or surrounding inflammatory changes. Spleen: Normal in size without focal abnormality. Adrenals/Urinary Tract: Adrenal glands are unremarkable. Kidneys are normal, without renal calculi, focal lesion, or hydronephrosis. Bladder is unremarkable. Stomach/Bowel: Stomach is within normal limits. Appendix appears normal. No evidence of bowel wall thickening, distention, or inflammatory changes. Vascular/Lymphatic: No significant vascular findings are present. No enlarged abdominal or pelvic lymph nodes. Reproductive: Fibroid uterus is again noted. 2.6 cm left ovarian cyst is noted. Other: No abdominal wall hernia or abnormality. No abdominopelvic ascites. Musculoskeletal: No acute or significant osseous findings. IMPRESSION: Fibroid  uterus. 2.6 cm left ovarian cyst. No other definite abnormality seen in the abdomen or pelvis. Electronically Signed   By: Marijo Conception, M.D.   On: 12/05/2016 21:24        Scheduled Meds: . sodium chloride   Intravenous Once  . sodium chloride   Intravenous Once  . ferrous sulfate  325 mg Oral BID WC  . folic acid  1 mg Oral Daily  . nicotine  14 mg Transdermal Daily  . pantoprazole  40 mg Oral BID   Continuous Infusions:   LOS: 2 days    Time spent: 35 minutes.     Elmarie Shiley, MD [facility] Pager [phone]  If 7PM-7AM, please contact night-coverage [email or web address] Password [email or web address] 12/07/2016, 2:50 PM

## 2016-12-07 NOTE — Progress Notes (Signed)
See EGD report and recommendations.  No further GI testing planned.  Signing off.  This patient has a GI physician in [facility] if further GI evaluation required.

## 2016-12-07 NOTE — Op Note (Signed)
[facility] Patient Name: Tiffany Christensen Procedure Date : 12/07/2016 MRN: [id number] Attending MD: Estill Cotta. Loletha Carrow , MD Date of Birth: 1981/07/12 CSN: [id number] Age: 35 Admit Type: Inpatient Procedure:                Upper GI endoscopy Indications:              Epigastric abdominal pain, Iron deficiency anemia.                            The patient reports prior Dx of ulcer and H pylori Providers:                Mallie Mussel L. Loletha Carrow, MD, Carolynn Comment, RN, Ralene Bathe, Technician Referring MD:              Medicines:                Monitored Anesthesia Care Complications:            No immediate complications. Estimated Blood Loss:     Estimated blood loss: none. Procedure:                Pre-Anesthesia Assessment:                           - Prior to the procedure, a History and Physical                            was performed, and patient medications and                            allergies were reviewed. The patient's tolerance of                            previous anesthesia was also reviewed. The risks                            and benefits of the procedure and the sedation                            options and risks were discussed with the patient.                            All questions were answered, and informed consent                            was obtained. Prior Anticoagulants: The patient has                            taken no previous anticoagulant or antiplatelet                            agents. ASA Grade Assessment: II - A patient with  mild systemic disease. After reviewing the risks                            and benefits, the patient was deemed in                            satisfactory condition to undergo the procedure.                           After obtaining informed consent, the endoscope was                            passed under direct vision. Throughout the   procedure, the patient's blood pressure, pulse, and                            oxygen saturations were monitored continuously. The                            [id number]) scope was introduced through the                            mouth, and advanced to the second part of duodenum.                            The upper GI endoscopy was accomplished without                            difficulty. The patient tolerated the procedure                            well. Scope In: Scope Out: Findings:      The esophagus was normal.      Two non-bleeding superficial gastric ulcers with no stigmata of bleeding       were found in the gastric body. The largest lesion was 5 mm in largest       dimension. Multiple biopsies were obtained in the gastric body and in       the gastric antrum with cold forceps for histology.      The cardia and gastric fundus were normal on retroflexion.      The examined duodenum was normal. Impression:               - Normal esophagus.                           - Non-bleeding gastric ulcers with no stigmata of                            bleeding.                           - Normal examined duodenum.                           - Multiple biopsies were obtained in the gastric  body and in the gastric antrum.                           These superficial ulcers are NOT the cause of this                            patient's pain, which has a possible gynecologic                            component and probable functional component. see                            12/06/16 inpatient GI consult note.                           The anemia appears due to menorrhagia rather than                            GI blood loss. Moderate Sedation:      MAC sedation used Recommendation:           - Resume regular diet.                           - Await pathology results.                           - Discontinue taking NSAIDs                           -  Continue present medications. Procedure Code(s):        --- Professional ---                           [phone], Esophagogastroduodenoscopy, flexible,                            transoral; with biopsy, single or multiple Diagnosis Code(s):        --- Professional ---                           K25.9, Gastric ulcer, unspecified as acute or                            chronic, without hemorrhage or perforation                           R10.13, Epigastric pain                           D50.9, Iron deficiency anemia, unspecified CPT copyright 2016 American Medical Association. All rights reserved. The codes documented in this report are preliminary and upon coder review may  be revised to meet current compliance requirements. [name] L. Loletha Carrow, MD 12/07/2016 8:13:52 AM This report has been signed electronically. Number of Addenda: 0

## 2016-12-07 NOTE — Anesthesia Postprocedure Evaluation (Signed)
Anesthesia Post Note  Patient: Tiffany Christensen  Procedure(s) Performed: Procedure(s) (LRB): ESOPHAGOGASTRODUODENOSCOPY (EGD) (N/A)  Patient location during evaluation: Endoscopy Anesthesia Type: MAC Level of consciousness: awake Pain management: pain level controlled Respiratory status: spontaneous breathing Cardiovascular status: stable Anesthetic complications: no       Last Vitals:  Vitals:   12/07/16 0815 12/07/16 0820  BP: (!) 105/56 (!) 99/50  Pulse: 81 91  Resp: 19 19  Temp:      Last Pain:  Vitals:   12/07/16 0852  TempSrc:   PainSc: 6                  Meg Niemeier

## 2016-12-07 NOTE — H&P (View-Only) (Signed)
[facility] Consult: 11:44 AM 12/06/2016  LOS: 1 day    Referring Provider: Dr Tyrell Antonio  Primary Care Physician:  [facility] Primary Gastroenterologist:  At [facility] in 2017.      Reason for Consultation:  Anemia, black stool, abdominal pain   HPI: Tiffany Christensen is a 35 y.o. female.  Past history of pelvic pain, fibroid uterus, uterine cysts. Status post vaginal delivery. She has undergone which she says is hemorrhoid surgery on 5 separate occasions, the last surgery was about 2 years ago.  10 years ago, during pregnancy, she was prescribed iron but had never continued on this and has never required blood transfusions. She is status post multiple breast lumpectomies dating back to her early teens, all of these were benign. Status post umbilical hernia repair and "freezing" of uterine cysts.  Gallstones, HIDA scan normal 07/2015.   Earlier in 2017 she was referred to [facility] specialist.  It is unclear why she was referred by her gynecologist to GI as the patient can't tell me but it seems that it was because of abdominal pain and perhaps because of anemia. She underwent upper endoscopy and she says she had colonoscopy twice. On the upper endoscopy with a found ulcers and she was prescribed a medication she cannot recall which she took until it ran out about 3 months ago, presumed this was a PPI. They also told her she had a bacteria in her stomach and gave her a 2 week course of antibiotics, presume this was for treatment of H. pylori.   Patient lost Medicare coverage so she hasn't been able to return to the GI specialist, unable to afford the office visit, so she comes to the emergency room for her issues.On 11/13/16 patient underwent pelvic ultrasound for pelvic pain.  Bilateral  ovarian cysts. Multiple uterine fibroids, these were possible source of the pain. Arterial and venous flow to the ovaries normal. On vaginal wet prep she had yeast, Trichomonas, clue cells and moderate WBCs. This was repeated a week later at which point the only finding was a few WBCs. Several physicians have told her they thought her pelvic pain was coming from the fibroids but her regular GYN, but Dr. Sharlet Salina in [facility], does not feel that these are the source of her symptoms.  Lower abdominal pain has persisted and is now predominantly in the left lower quadrant but is now radiating up into the upper abdomen. She's been taking ibuprofen 1600-2000 mg daily most days of the week for several weeks if not of few months. She's had nausea but not vomiting. Doesn't describe a lot of heartburn or reflux. No dysphagia. She is chronically constipated ever since her spinal hemorrhoid surgery. On average she has a decent bowel movement once a month and these are hard. She only takes laxatives maybe monthly if that so she is able to tolerate the constipation very well. Hasn't had any tarry or black stools. Hasn't passed any blood per rectum. No vomiting of blood. She has monthly menstrual periods at times  the bleeding is quite heavy but not always.  Periods last about 7 days.  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  Patient has microcytic anemia, hemoglobin 9.1 MCV 64.7. CT scan of abdomen pelvis with contrast showed uterine fibroids 2.6 cm left ovarian cyst.      Past Medical History:  Diagnosis Date  . Migraine   . Ovarian cyst   . Uterine fibroid     Past Surgical History:  Procedure Laterality Date  . BREAST SURGERY    . hemmorhoidectomy    . HEMORRHOID SURGERY    . HERNIA REPAIR      Prior to Admission medications   Not on File    Scheduled Meds: . sodium  chloride   Intravenous Once  . sodium chloride   Intravenous Once  . ferrous sulfate  325 mg Oral BID WC  . folic acid  1 mg Oral Daily  . nicotine  14 mg Transdermal Daily  . pantoprazole (PROTONIX) IV  40 mg Intravenous Q12H   Infusions:  PRN Meds: acetaminophen **OR** acetaminophen, alum & mag hydroxide-simeth, morphine injection, ondansetron **OR** ondansetron (ZOFRAN) IV, traMADol   Allergies as of 12/04/2016 - Review Complete 12/04/2016  Allergen Reaction Noted  . Codeine Itching 03/18/2012    Family History  Problem Relation Age of Onset  . Hypertension Maternal Grandmother     Social History   Social History  . Marital status: Single    Spouse name: N/A  . Number of children: N/A  . Years of education: N/A   Occupational History  . Not on file.   Social History Main Topics  . Smoking status: Current Every Day Smoker    Packs/day: 0.50    Types: Cigarettes  . Smokeless tobacco: Never Used  . Alcohol use No  . Drug use: No  . Sexual activity: Yes    Birth control/ protection: None   Other Topics Concern  . Not on file   Social History Narrative  . No narrative on file    REVIEW OF SYSTEMS: Constitutional:  No fatigue or weakness. ENT:  No nose bleeds Pulm:  No shortness of breath or cough. CV:  No palpitations, no LE edema.  GU:  No hematuria, no frequency GI:  Per HPI Heme:  No unusual bleeding or bruising tendency.   Transfusions:  Patient was transfused with PRBC 2 overnight, this is the first time she's ever received blood transfusions. Neuro:  No headaches, no peripheral tingling or numbness Derm:  No itching, no rash or sores.  Endocrine:  No sweats or chills.  No polyuria or dysuria Immunization:  Did not inquire with her as to recent or previous vaccinations. Travel:  None beyond local counties in last few months.    PHYSICAL EXAM: Vital signs in last 24 hours: Vitals:   12/06/16 0307 12/06/16 0520  BP: 119/75 120/75  Pulse: 80 76   Resp: 20 18  Temp: 98.5 F (36.9 C) 98 F (36.7 C)   Wt Readings from Last 3 Encounters:  12/05/16 61.8 kg (136 lb 4.8 oz)  11/13/16 63.5 kg (140 lb)  11/07/16 63.5 kg (140 lb)    General: Pleasant, comfortable AAF. She does not look ill. Head:  No facial asymmetry or swelling  Eyes:  No scleral icterus or conjunctival pallor Ears:  Not HOH.  Nose:  No congestion  or discharge Mouth:  Moist and clear oral mucosa. Good dentition. Neck:  No JVD or masses. No TMG Lungs:  Clear bilaterally. No labored breathing or cough. Heart: RRR. No MRG. S1, S2 present. Abdomen:  Soft. Not distended. She has pain when she tries to sit up or lay back. She has tenderness to light touch in the lower abdomen especially on the left and up into the mid left. No guarding or rebound. Bowel sounds hypoactive. No masses or hepatosplenomegaly..   Rectal: Deferred   Musc/Skeltl: No joint deformity or swelling. Extremities:  No CCE.  Neurologic:  Oriented 3. Fully alert. Cell 4 limbs and strength is full. No tremor. No gross deficits Skin:  No rashes or sores Nodes:  No cervical adenopathy   Psych:  Cooperative, pleasant, calm.  Intake/Output from previous day: 12/27 0701 - 12/28 0700 In: 1870 [P.O.:1200; Blood:670] Out: 2550 [Urine:2550] Intake/Output this shift: Total I/O In: 120 [P.O.:120] Out: -   LAB RESULTS:  Recent Labs  12/04/16 1750 12/05/16 1306 12/05/16 1900 12/06/16 0741  WBC 6.0  --   --  6.5  HGB 6.6* 7.7* 8.7* 9.1*  HCT 22.2* 25.0* 27.9* 28.6*  PLT 378  --   --  346   BMET Lab Results  Component Value Date   NA 134 (L) 12/06/2016   NA 133 (L) 12/04/2016   NA 136 11/13/2016   K 4.3 12/06/2016   K 3.7 12/04/2016   K 3.5 11/13/2016   CL 104 12/06/2016   CL 107 12/04/2016   CL 111 11/13/2016   CO2 26 12/06/2016   CO2 21 (L) 12/04/2016   CO2 21 (L) 11/13/2016   GLUCOSE 86 12/06/2016   GLUCOSE 96 12/04/2016   GLUCOSE 99 11/13/2016   BUN 7 12/06/2016   BUN 6  12/04/2016   BUN 6 11/13/2016   CREATININE 0.58 12/06/2016   CREATININE 0.53 12/04/2016   CREATININE 0.49 11/13/2016   CALCIUM 8.9 12/06/2016   CALCIUM 8.5 (L) 12/04/2016   CALCIUM 8.9 11/13/2016   LFT  Recent Labs  12/04/16 1750  PROT 6.9  ALBUMIN 3.5  AST 15  ALT 7*  ALKPHOS 89  BILITOT 0.2*   PT/INR No results found for: INR, PROTIME Hepatitis Panel No results for input(s): HEPBSAG, HCVAB, HEPAIGM, HEPBIGM in the last 72 hours. C-Diff No components found for: CDIFF Lipase     Component Value Date/Time   LIPASE 52 (H) 12/04/2016 1750    Drugs of Abuse  No results found for: LABOPIA, COCAINSCRNUR, LABBENZ, AMPHETMU, THCU, LABBARB   RADIOLOGY STUDIES: Ct Abdomen Pelvis W Contrast  Result Date: 12/05/2016 CLINICAL DATA:  Left upper quadrant abdominal pain for 2 weeks. EXAM: CT ABDOMEN AND PELVIS WITH CONTRAST TECHNIQUE: Multidetector CT imaging of the abdomen and pelvis was performed using the standard protocol following bolus administration of intravenous contrast. CONTRAST:  135mL ISOVUE-300 IOPAMIDOL (ISOVUE-300) INJECTION 61% COMPARISON:  CT scan of May 18, 2015. FINDINGS: Lower chest: Mild bilateral posterior basilar subsegmental atelectasis is noted. Hepatobiliary: No focal liver abnormality is seen. No gallstones, gallbladder wall thickening, or biliary dilatation. Pancreas: Unremarkable. No pancreatic ductal dilatation or surrounding inflammatory changes. Spleen: Normal in size without focal abnormality. Adrenals/Urinary Tract: Adrenal glands are unremarkable. Kidneys are normal, without renal calculi, focal lesion, or hydronephrosis. Bladder is unremarkable. Stomach/Bowel: Stomach is within normal limits. Appendix appears normal. No evidence of bowel wall thickening, distention, or inflammatory changes. Vascular/Lymphatic: No significant vascular findings are present. No enlarged abdominal or pelvic lymph nodes. Reproductive: Fibroid  uterus is again noted. 2.6 cm left  ovarian cyst is noted. Other: No abdominal wall hernia or abnormality. No abdominopelvic ascites. Musculoskeletal: No acute or significant osseous findings. IMPRESSION: Fibroid uterus. 2.6 cm left ovarian cyst. No other definite abnormality seen in the abdomen or pelvis. Electronically Signed   By: Marijo Conception, M.D.   On: 12/05/2016 21:24      IMPRESSION:   *  Chronic pelvic pain, now with left lower quadrant pain and tenderness. CT scan unrevealing. Previous providers have felt that her uterine fibroids with the source of her discomfort. Also has bilateral ovarian cyst but no evidence for cyst rupture.  *  Microcytic anemia. Has undergone upper endoscopy as well as colonoscopies within the past 12 months. Faxed over request for release of procedure information to [facility]. Hopefully we can get these records and review.  *  Ulcers reported on EGD earlier this year.  Stopped medical therapy at least 3 months ago. Taking lots of ibuprofen, which was the case earlier this year. Suspect recurrent ulcers.  She is receiving twice daily IV Protonix.  *  Status post multiple hemorrhoidectomies versus hemorrhoidal banding.  Is not having any rectal bleeding/hematochezia.  FOBT negative on 12/04/16.  *  Chronic constipation. Surprising that she is able to tolerate having a bowel movement only once per month. Wonder if constipation is contributing to her pain.   PLAN:     *  Reviewed GI procedural records from [facility].  May need to repeat upper endoscopy, will discuss with Dr Loletha Carrow.  *  Going to switch the IV Protonix to oral but continue it twice daily. Given her severe constipation, not sure that adding twice daily oral iron is the best strategy. Would ask pharmacy to dose parenteral iron   Azucena Freed  12/06/2016, 11:44 AM Pager: [phone]

## 2016-12-08 DIAGNOSIS — D520 Dietary folate deficiency anemia: Secondary | ICD-10-CM

## 2016-12-08 DIAGNOSIS — N920 Excessive and frequent menstruation with regular cycle: Secondary | ICD-10-CM

## 2016-12-08 DIAGNOSIS — D500 Iron deficiency anemia secondary to blood loss (chronic): Secondary | ICD-10-CM

## 2016-12-08 DIAGNOSIS — D529 Folate deficiency anemia, unspecified: Secondary | ICD-10-CM

## 2016-12-08 LAB — CBC
HCT: 30.5 % — ABNORMAL LOW (ref 36.0–46.0)
Hemoglobin: 9.6 g/dL — ABNORMAL LOW (ref 12.0–15.0)
MCH: 20.6 pg — ABNORMAL LOW (ref 26.0–34.0)
MCHC: 31.5 g/dL (ref 30.0–36.0)
MCV: 65.3 fL — ABNORMAL LOW (ref 78.0–100.0)
Platelets: 391 10*3/uL (ref 150–400)
RBC: 4.67 MIL/uL (ref 3.87–5.11)
RDW: 22.6 % — ABNORMAL HIGH (ref 11.5–15.5)
WBC: 7.8 10*3/uL (ref 4.0–10.5)

## 2016-12-08 MED ORDER — POLYETHYLENE GLYCOL 3350 17 G PO PACK: 17.0000 g | PACK | Freq: Every day | ORAL | 0 refills | Status: DC | PRN

## 2016-12-08 MED ORDER — ACETAMINOPHEN 325 MG PO TABS: 650.0000 mg | ORAL_TABLET | Freq: Four times a day (QID) | ORAL | Status: DC | PRN

## 2016-12-08 MED ORDER — FERUMOXYTOL INJECTION 510 MG/17 ML
510.0000 mg | Freq: Once | INTRAVENOUS | Status: AC
  Administered 2016-12-08: 510 mg via INTRAVENOUS
  Filled 2016-12-08: qty 17

## 2016-12-08 MED ORDER — FOLIC ACID 1 MG PO TABS: 1.0000 mg | ORAL_TABLET | Freq: Every day | ORAL | 3 refills | Status: DC

## 2016-12-08 MED ORDER — FERROUS SULFATE 325 (65 FE) MG PO TABS: 325.0000 mg | ORAL_TABLET | Freq: Two times a day (BID) | ORAL | 3 refills | Status: DC

## 2016-12-08 MED ORDER — BISACODYL 10 MG RE SUPP: 10.0000 mg | RECTAL | Status: DC | PRN

## 2016-12-08 MED ORDER — PANTOPRAZOLE SODIUM 40 MG PO TBEC: 40.0000 mg | DELAYED_RELEASE_TABLET | Freq: Two times a day (BID) | ORAL | 3 refills | Status: DC

## 2016-12-08 MED ORDER — SENNOSIDES-DOCUSATE SODIUM 8.6-50 MG PO TABS: 2.0000 | ORAL_TABLET | Freq: Every day | ORAL | 0 refills | Status: DC

## 2016-12-08 MED ORDER — BISACODYL 10 MG RE SUPP: 10.0000 mg | RECTAL | 0 refills | Status: DC | PRN

## 2016-12-08 MED ORDER — ALUM & MAG HYDROXIDE-SIMETH 200-200-20 MG/5ML PO SUSP: 30.0000 mL | Freq: Four times a day (QID) | ORAL | 0 refills | Status: DC | PRN

## 2016-12-08 NOTE — Progress Notes (Signed)
S/W Carlyon Shadow, Case Management about pt consult.

## 2016-12-08 NOTE — Discharge Summary (Addendum)
Physician Discharge Summary  Tiffany Christensen [id number] DOB: March 14, 1981 DOA: 12/04/2016  PCP: No PCP Per Patient  Admit date: 12/04/2016 Discharge date: 12/08/2016  Admitted From: home Disposition:  home   Recommendations for Outpatient Follow-up: I have asked our social worker to assist with medications and finding a PCP as she has lost her insurance.   1. F/u Hb, iron and folic acid levels 2. GI and PCP to f/u on path report from biopsy  Discharge Condition:  stable   CODE STATUS:  Full code   Diet recommendation:  Heart healthy Consultations:  GI   Discharge Diagnoses:  Principal Problem:   Anemia due to chronic blood loss Active Problems:   Left sided abdominal pain   Menorrhagia   Iron deficiency anemia due to chronic blood loss   Chronic gastric ulcer without hemorrhage and without perforation   Folate deficiency anemia    Subjective:  Left abdominal pain persists.  Has her period. No new complaints.   Brief Summary: Tiffany Karow Johnsonis a 35 y.o.femalewith history of chronic anemia and menorrhagia uterine fibroids presents to the ER because of persistent pain in the left lower quadrant. Patient has had recent pelvic sonogram which showed uterine fibroids and ovarian cyst on the right side. Patient was found to have a hemoglobin around 6 in the ER. She admits to  Having menorrhagia. Anemia panel also shows ferritin of 1.     Hospital Course:    Anemia Received 2 units PRBC. Hb at 9.  (A) iron deficiency -  could be related to menorrhagia and slow bleed from gastric ulcers - she has a h/o PUD with treatment of H pylori in the spring and therfore a repeat EGD was performed which revealed non-bleeding gastric ulcers- she does take NSAIDs and has been advised to stop them- BID PPI prescribed and biopsies obtained - have given her a dose of IV Feraheme and have prescribed oral Iron   (B) folic acid deficiency - have prescribed oral Folic acid  . Iron    11         Iron     UIBC    534        UIBC    TIBC    545        TIBC    Saturation Ratios    2        Saturation Ratios    Ferritin    1        Ferritin    Folate    3.8        Folate    Vitamin B12    452     Left sided abdominal pain - CT abdomen-  fibroid and left ovarian cyst which are likely not causing the pain  - etiology still undetermined but she does admit to severe constipation and has 2 BM / week which are mainly small "balls" of hard stool - have prescribed a regimen of laxatives and asked her to ensure she has 2-3 normal BMs / week which may help alleviate her pain.  - GI does not feel any further w/u is necessary at this time   Fibroid Uterus with menorrhagia - she has a f/u appt with Gyn      Discharge Instructions   Allergies as of 12/08/2016      Reactions   Codeine Itching      Medication List    TAKE these medications   acetaminophen 325 MG tablet Commonly known as:  TYLENOL Take 2 tablets (650 mg total) by mouth every 6 (six) hours as needed for mild pain (or Fever >/= 101).   alum & mag hydroxide-simeth 200-200-20 MG/5ML suspension Commonly known as:  MAALOX/MYLANTA Take 30 mLs by mouth every 6 (six) hours as needed for indigestion or heartburn.   bisacodyl 10 MG suppository Commonly known as:  DULCOLAX Place 1 suppository (10 mg total) rectally every other day as needed for moderate constipation.   ferrous sulfate 325 (65 FE) MG tablet Take 1 tablet (325 mg total) by mouth 2 (two) times daily with a meal.   folic acid 1 MG tablet Commonly known as:  FOLVITE Take 1 tablet (1 mg total) by mouth daily. Start taking on:  12/09/2016   pantoprazole 40 MG tablet Commonly known as:  PROTONIX Take 1 tablet (40 mg total) by mouth 2 (two) times daily.   polyethylene glycol packet Commonly known as:  MIRALAX Take 17 g by mouth daily as needed for mild constipation.   senna-docusate 8.6-50 MG tablet Commonly known as:  Senokot-S Take 2 tablets by  mouth at bedtime.       Allergies  Allergen Reactions  . Codeine Itching     Procedures/Studies: EGD - 12/29- Dr Loletha Carrow - Normal esophagus.                           - Non-bleeding gastric ulcers with no stigmata of                            bleeding.                           - Normal examined duodenum.                           - Multiple biopsies were obtained in the gastric                            body and in the gastric antrum.                           These superficial ulcers are NOT the cause of this                            patient's pain, which has a possible gynecologic                            component and probable functional component. see                            12/06/16 inpatient GI consult note.                           The anemia appears due to menorrhagia rather than                            GI blood loss.  US Transvaginal Non-ob  Result Date: 12/06/2016 CLINICAL DATA:  35 year old female with a history of uterine fibroids and left adnexal cyst on CT study  from 1 day prior presents with 2 days of left abdominal pain. LMP 11/08/2016. EXAM: TRANSABDOMINAL AND TRANSVAGINAL ULTRASOUND OF PELVIS TECHNIQUE: Both transabdominal and transvaginal ultrasound examinations of the pelvis were performed. Transabdominal technique was performed for global imaging of the pelvis including uterus, ovaries, adnexal regions, and pelvic cul-de-sac. It was necessary to proceed with endovaginal exam following the transabdominal exam to visualize the endometrium and adnexa. COMPARISON:  11/13/2016 pelvic sonogram. 12/05/2016 CT abdomen/pelvis. FINDINGS: Uterus Measurements: 10.1 x 6.9 x 7.0 cm. The anteverted uterus is enlarged by multiple fibroids, with representative fibroids as follows: - right anterior fundal intramural 3.8 x 3.2 x 3.2 cm fibroid, previously 3.7 x 3.1 x 3.4 cm on 11/13/2016 using similar measurement technique, not appreciably changed - left uterine body  intramural 2.8 x 2.3 x 3.2 cm fibroid, previously 2.4 x 2.4 x 2.9 cm on 11/13/2016, not appreciably changed allowing for differences in measurement technique - posterior right upper uterine body intramural 2.9 x 2.1 x 2.5 cm fibroid, previously 2.7 x 2.1 x 2.6 cm on 11/13/2016, not appreciably changed Endometrium Thickness: 13 mm. No endometrial cavity fluid or focal endometrial mass demonstrated. Right ovary Measurements: 3.7 x 2.1 x 2.8 cm. Normal appearance/no adnexal mass. Resolution of the previously demonstrated right ovarian cyst on the 11/13/2016 sonogram. Left ovary Measurements: 5.2 x 3.3 x 4.0 cm (transabdominal measurement). Simple 2.7 x 2.4 x 2.5 cm left ovarian follicular cyst, new since 11/13/2016. No suspicious left ovarian or left adnexal masses. Other findings No abnormal free fluid. IMPRESSION: 1. Stable enlarged myomatous uterus. 2. No suspicious ovarian or adnexal findings. Previously described right ovarian cyst on the 11/13/2016 sonogram has resolved. New simple 2.7 cm left ovarian follicular cyst, for which no further follow-up is recommended. This recommendation follows the consensus statement: Management of Asymptomatic Ovarian and Other Adnexal Cysts Imaged at [place]: Society of Radiologists in [place]. Radiology 2010; [phone]. Electronically Signed   By: Ilona Sorrel M.D.   On: 12/06/2016 20:45   US Transvaginal Non-ob  Result Date: 11/13/2016 CLINICAL DATA:  Acute onset of pelvic pain over the last 3 hours. EXAM: TRANSABDOMINAL AND TRANSVAGINAL ULTRASOUND OF PELVIS DOPPLER ULTRASOUND OF OVARIES TECHNIQUE: Both transabdominal and transvaginal ultrasound examinations of the pelvis were performed. Transabdominal technique was performed for global imaging of the pelvis including uterus, ovaries, adnexal regions, and pelvic cul-de-sac. It was necessary to proceed with endovaginal exam following the transabdominal exam to visualize the ovaries and  endometrium. Color and duplex Doppler ultrasound was utilized to evaluate blood flow to the ovaries. COMPARISON:  CT of the abdomen and pelvis 05/18/2015. Ultrasound 06/21/2010. FINDINGS: Uterus Measurements: 10.6 x 6.0 x 7.1 cm. A least 4 discrete uterine fibroids are noted. The largest lesions anterior on the right measuring 3.0 x 2.3 x 2.8 cm. An anterior left lesion measures 2.9 cm maximally. A posterior lesion measures 2.7 cm. An additional right at the fundus measures 2.3 cm maximally. Endometrium Thickness: 3 mm, within normal limits. No focal abnormality visualized. Right ovary Measurements: 5.1 x 3.7 x 3.9 cm. 83.1 x 3.3 x 3.3 cm cyst demonstrates some internal echoes. There is increased color Doppler flow with the periphery. No other focal lesions are present. Left ovary Measurements: 3.3 x 2.0 x 2.8 cm, within normal limits. Normal appearance/no adnexal mass. Pulsed Doppler evaluation of both ovaries demonstrates normal low-resistance arterial and venous waveforms. Other findings No abnormal free fluid. IMPRESSION: 1. Complex 3.3 cm right ovarian cyst. This is almost certainly benign, and no  specific imaging follow up is recommended according to the Society of Radiologists in [place] Statement (D Janae Bridgeman al. Management of Asymptomatic Ovarian and Other Adnexal Cysts Imaged at [place]: Society of Radiologists in [place] Statement 2010. Radiology 256 (Sept 2010): [phone].). 2. Multiple uterine fibroids.  These could also be a source of pain. 3. Normal arterial and venous flow to the ovaries bilaterally. Electronically Signed   By: San Morelle M.D.   On: 11/13/2016 11:05   US Pelvis Complete  Result Date: 12/06/2016 CLINICAL DATA:  35 year old female with a history of uterine fibroids and left adnexal cyst on CT study from 1 day prior presents with 2 days of left abdominal pain. LMP 11/08/2016. EXAM: TRANSABDOMINAL AND TRANSVAGINAL ULTRASOUND OF  PELVIS TECHNIQUE: Both transabdominal and transvaginal ultrasound examinations of the pelvis were performed. Transabdominal technique was performed for global imaging of the pelvis including uterus, ovaries, adnexal regions, and pelvic cul-de-sac. It was necessary to proceed with endovaginal exam following the transabdominal exam to visualize the endometrium and adnexa. COMPARISON:  11/13/2016 pelvic sonogram. 12/05/2016 CT abdomen/pelvis. FINDINGS: Uterus Measurements: 10.1 x 6.9 x 7.0 cm. The anteverted uterus is enlarged by multiple fibroids, with representative fibroids as follows: - right anterior fundal intramural 3.8 x 3.2 x 3.2 cm fibroid, previously 3.7 x 3.1 x 3.4 cm on 11/13/2016 using similar measurement technique, not appreciably changed - left uterine body intramural 2.8 x 2.3 x 3.2 cm fibroid, previously 2.4 x 2.4 x 2.9 cm on 11/13/2016, not appreciably changed allowing for differences in measurement technique - posterior right upper uterine body intramural 2.9 x 2.1 x 2.5 cm fibroid, previously 2.7 x 2.1 x 2.6 cm on 11/13/2016, not appreciably changed Endometrium Thickness: 13 mm. No endometrial cavity fluid or focal endometrial mass demonstrated. Right ovary Measurements: 3.7 x 2.1 x 2.8 cm. Normal appearance/no adnexal mass. Resolution of the previously demonstrated right ovarian cyst on the 11/13/2016 sonogram. Left ovary Measurements: 5.2 x 3.3 x 4.0 cm (transabdominal measurement). Simple 2.7 x 2.4 x 2.5 cm left ovarian follicular cyst, new since 11/13/2016. No suspicious left ovarian or left adnexal masses. Other findings No abnormal free fluid. IMPRESSION: 1. Stable enlarged myomatous uterus. 2. No suspicious ovarian or adnexal findings. Previously described right ovarian cyst on the 11/13/2016 sonogram has resolved. New simple 2.7 cm left ovarian follicular cyst, for which no further follow-up is recommended. This recommendation follows the consensus statement: Management of Asymptomatic  Ovarian and Other Adnexal Cysts Imaged at [place]: Society of Radiologists in [place]. Radiology 2010; [phone]. Electronically Signed   By: Ilona Sorrel M.D.   On: 12/06/2016 20:45   US Pelvis Complete  Result Date: 11/13/2016 CLINICAL DATA:  Acute onset of pelvic pain over the last 3 hours. EXAM: TRANSABDOMINAL AND TRANSVAGINAL ULTRASOUND OF PELVIS DOPPLER ULTRASOUND OF OVARIES TECHNIQUE: Both transabdominal and transvaginal ultrasound examinations of the pelvis were performed. Transabdominal technique was performed for global imaging of the pelvis including uterus, ovaries, adnexal regions, and pelvic cul-de-sac. It was necessary to proceed with endovaginal exam following the transabdominal exam to visualize the ovaries and endometrium. Color and duplex Doppler ultrasound was utilized to evaluate blood flow to the ovaries. COMPARISON:  CT of the abdomen and pelvis 05/18/2015. Ultrasound 06/21/2010. FINDINGS: Uterus Measurements: 10.6 x 6.0 x 7.1 cm. A least 4 discrete uterine fibroids are noted. The largest lesions anterior on the right measuring 3.0 x 2.3 x 2.8 cm. An anterior left lesion measures 2.9 cm maximally. A posterior  lesion measures 2.7 cm. An additional right at the fundus measures 2.3 cm maximally. Endometrium Thickness: 3 mm, within normal limits. No focal abnormality visualized. Right ovary Measurements: 5.1 x 3.7 x 3.9 cm. 83.1 x 3.3 x 3.3 cm cyst demonstrates some internal echoes. There is increased color Doppler flow with the periphery. No other focal lesions are present. Left ovary Measurements: 3.3 x 2.0 x 2.8 cm, within normal limits. Normal appearance/no adnexal mass. Pulsed Doppler evaluation of both ovaries demonstrates normal low-resistance arterial and venous waveforms. Other findings No abnormal free fluid. IMPRESSION: 1. Complex 3.3 cm right ovarian cyst. This is almost certainly benign, and no specific imaging follow up is recommended according to  the Society of Radiologists in [place] (D Clovis Riley et al. Management of Asymptomatic Ovarian and Other Adnexal Cysts Imaged at [place]: Society of Radiologists in [place] 2010. Radiology 256 (Sept 2010): [phone].). 2. Multiple uterine fibroids.  These could also be a source of pain. 3. Normal arterial and venous flow to the ovaries bilaterally. Electronically Signed   By: San Morelle M.D.   On: 11/13/2016 11:05   Ct Abdomen Pelvis W Contrast  Result Date: 12/05/2016 CLINICAL DATA:  Left upper quadrant abdominal pain for 2 weeks. EXAM: CT ABDOMEN AND PELVIS WITH CONTRAST TECHNIQUE: Multidetector CT imaging of the abdomen and pelvis was performed using the standard protocol following bolus administration of intravenous contrast. CONTRAST:  139mL ISOVUE-300 IOPAMIDOL (ISOVUE-300) INJECTION 61% COMPARISON:  CT scan of May 18, 2015. FINDINGS: Lower chest: Mild bilateral posterior basilar subsegmental atelectasis is noted. Hepatobiliary: No focal liver abnormality is seen. No gallstones, gallbladder wall thickening, or biliary dilatation. Pancreas: Unremarkable. No pancreatic ductal dilatation or surrounding inflammatory changes. Spleen: Normal in size without focal abnormality. Adrenals/Urinary Tract: Adrenal glands are unremarkable. Kidneys are normal, without renal calculi, focal lesion, or hydronephrosis. Bladder is unremarkable. Stomach/Bowel: Stomach is within normal limits. Appendix appears normal. No evidence of bowel wall thickening, distention, or inflammatory changes. Vascular/Lymphatic: No significant vascular findings are present. No enlarged abdominal or pelvic lymph nodes. Reproductive: Fibroid uterus is again noted. 2.6 cm left ovarian cyst is noted. Other: No abdominal wall hernia or abnormality. No abdominopelvic ascites. Musculoskeletal: No acute or significant osseous findings. IMPRESSION: Fibroid uterus. 2.6 cm left ovarian  cyst. No other definite abnormality seen in the abdomen or pelvis. Electronically Signed   By: Marijo Conception, M.D.   On: 12/05/2016 21:24   [place] Art/ven Flow Abd Pelv Doppler  Result Date: 11/13/2016 CLINICAL DATA:  Acute onset of pelvic pain over the last 3 hours. EXAM: TRANSABDOMINAL AND TRANSVAGINAL ULTRASOUND OF PELVIS DOPPLER ULTRASOUND OF OVARIES TECHNIQUE: Both transabdominal and transvaginal ultrasound examinations of the pelvis were performed. Transabdominal technique was performed for global imaging of the pelvis including uterus, ovaries, adnexal regions, and pelvic cul-de-sac. It was necessary to proceed with endovaginal exam following the transabdominal exam to visualize the ovaries and endometrium. Color and duplex Doppler ultrasound was utilized to evaluate blood flow to the ovaries. COMPARISON:  CT of the abdomen and pelvis 05/18/2015. Ultrasound 06/21/2010. FINDINGS: Uterus Measurements: 10.6 x 6.0 x 7.1 cm. A least 4 discrete uterine fibroids are noted. The largest lesions anterior on the right measuring 3.0 x 2.3 x 2.8 cm. An anterior left lesion measures 2.9 cm maximally. A posterior lesion measures 2.7 cm. An additional right at the fundus measures 2.3 cm maximally. Endometrium Thickness: 3 mm, within normal limits. No focal abnormality visualized. Right ovary Measurements: 5.1 x 3.7  x 3.9 cm. 83.1 x 3.3 x 3.3 cm cyst demonstrates some internal echoes. There is increased color Doppler flow with the periphery. No other focal lesions are present. Left ovary Measurements: 3.3 x 2.0 x 2.8 cm, within normal limits. Normal appearance/no adnexal mass. Pulsed Doppler evaluation of both ovaries demonstrates normal low-resistance arterial and venous waveforms. Other findings No abnormal free fluid. IMPRESSION: 1. Complex 3.3 cm right ovarian cyst. This is almost certainly benign, and no specific imaging follow up is recommended according to the Society of Radiologists in [place] (D Clovis Riley et al. Management of Asymptomatic Ovarian and Other Adnexal Cysts Imaged at [place]: Society of Radiologists in [place] 2010. Radiology 256 (Sept 2010): [phone].). 2. Multiple uterine fibroids.  These could also be a source of pain. 3. Normal arterial and venous flow to the ovaries bilaterally. Electronically Signed   By: San Morelle M.D.   On: 11/13/2016 11:05       Discharge Exam: Vitals:   12/07/16 2152 12/08/16 0457  BP: 119/69 125/79  Pulse: 85 82  Resp: 18 17  Temp: 98.5 F (36.9 C) 97.8 F (36.6 C)   Vitals:   12/07/16 0820 12/07/16 1402 12/07/16 2152 12/08/16 0457  BP: (!) 99/50 133/74 119/69 125/79  Pulse: 91 70 85 82  Resp: 19  18 17   Temp:  98.6 F (37 C) 98.5 F (36.9 C) 97.8 F (36.6 C)  TempSrc:  Oral Oral Oral  SpO2: 100% 99% 100% 99%  Weight:      Height:        General: Pt is alert, awake, not in acute distress Cardiovascular: RRR, S1/S2 +, no rubs, no gallops Respiratory: CTA bilaterally, no wheezing, no rhonchi Abdominal: Soft, tender in entire left abdomen, ND, bowel sounds + Extremities: no edema, no cyanosis    The results of significant diagnostics from this hospitalization (including imaging, microbiology, ancillary and laboratory) are listed below for reference.     Microbiology: No results found for this or any previous visit (from the past 240 hour(s)).   Labs: BNP (last 3 results) No results for input(s): BNP in the last 8760 hours. Basic Metabolic Panel:  Recent Labs Lab 12/04/16 1750 12/06/16 1000  NA 133* 134*  K 3.7 4.3  CL 107 104  CO2 21* 26  GLUCOSE 96 86  BUN 6 7  CREATININE 0.53 0.58  CALCIUM 8.5* 8.9   Liver Function Tests:  Recent Labs Lab 12/04/16 1750  AST 15  ALT 7*  ALKPHOS 89  BILITOT 0.2*  PROT 6.9  ALBUMIN 3.5    Recent Labs Lab 12/04/16 1750  LIPASE 52*   No results for input(s): AMMONIA in the last 168  hours. CBC:  Recent Labs Lab 12/04/16 1750 12/05/16 1306 12/05/16 1900 12/06/16 0741 12/07/16 0839 12/08/16 1207  WBC 6.0  --   --  6.5  --  7.8  HGB 6.6* 7.7* 8.7* 9.1* 9.1* 9.6*  HCT 22.2* 25.0* 27.9* 28.6* 29.4* 30.5*  MCV 62.0*  --   --  64.7*  --  65.3*  PLT 378  --   --  346  --  391   Cardiac Enzymes: No results for input(s): CKTOTAL, CKMB, CKMBINDEX, TROPONINI in the last 168 hours. BNP: Invalid input(s): POCBNP CBG: No results for input(s): GLUCAP in the last 168 hours. D-Dimer No results for input(s): DDIMER in the last 72 hours. Hgb A1c No results for input(s): HGBA1C in the last 72 hours. Lipid Profile  No results for input(s): CHOL, HDL, LDLCALC, TRIG, CHOLHDL, LDLDIRECT in the last 72 hours. Thyroid function studies No results for input(s): TSH, T4TOTAL, T3FREE, THYROIDAB in the last 72 hours.  Invalid input(s): FREET3 Anemia work up No results for input(s): VITAMINB12, FOLATE, FERRITIN, TIBC, IRON, RETICCTPCT in the last 72 hours. Urinalysis    Component Value Date/Time   COLORURINE YELLOW 12/04/2016 [place] 12/04/2016 1655   LABSPEC 1.014 12/04/2016 1655   PHURINE 5.5 12/04/2016 1655   GLUCOSEU NEGATIVE 12/04/2016 1655   HGBUR LARGE (A) 12/04/2016 1655   BILIRUBINUR NEGATIVE 12/04/2016 1655   KETONESUR NEGATIVE 12/04/2016 1655   PROTEINUR NEGATIVE 12/04/2016 1655   UROBILINOGEN 1.0 05/17/2015 2200   NITRITE NEGATIVE 12/04/2016 1655   LEUKOCYTESUR NEGATIVE 12/04/2016 1655   Sepsis Labs Invalid input(s): PROCALCITONIN,  WBC,  LACTICIDVEN Microbiology No results found for this or any previous visit (from the past 240 hour(s)).   Time coordinating discharge: Over 30 minutes  SIGNED:   Debbe Odea, MD  [facility] 12/08/2016, 2:38 PM Pager   If 7PM-7AM, please contact night-coverage [email or web address] Password [email or web address]

## 2016-12-08 NOTE — Progress Notes (Signed)
CM Darlene talking with pt now about resources and [employer] and [employer].

## 2016-12-08 NOTE — Progress Notes (Signed)
Pt ready for DC to home.  All DC instructions and Rxs given and explained to pt.  Pt understands to go to the [facility] on Tuesday, 1/2 at 0900 to get enrolled so she can get an appt.

## 2016-12-08 NOTE — Care Management Note (Signed)
Case Management Note  Patient Details  Name: Tiffany Christensen MRN: [id number] Date of Birth: [birth year]  Subjective/Objective:                   35 year old female admitted with anemia due to chronic blood loss, menorrhagia uterine fibroids,  and left lower quadrant pain.    Action/Plan: RNCM spoke with patient and patient's nurse Sharee Pimple).    Patient for pending discharge today.   Given phone number  [phone]) and address ([address] [place], [place] [address])  for [place].    Patient will go to [facility] on 12/11/16 to set up appointment and follow up on medication assistance.   Patient states she will be able to obtain medications until visit with [place].  Patient has no other CM needs at this time.     Expected Discharge Date:  12/08/16               Expected Discharge Plan:  Home/Self Care  In-House Referral:  NA  Discharge planning Services  CM Consult  Post Acute Care Choice:  NA Choice offered to:  NA  DME Arranged:   (Per patient no DME needs at this time.) DME Agency:     HH Arranged:    [place] Agency:     Status of Service:  Completed, signed off  If discussed at [employer], dates discussed:    Additional Comments:  Serena Colonel, RN 12/08/2016, 5:07 PM

## 2016-12-10 ENCOUNTER — Emergency Department (HOSPITAL_BASED_OUTPATIENT_CLINIC_OR_DEPARTMENT_OTHER): Payer: Medicaid Other

## 2016-12-10 ENCOUNTER — Emergency Department (HOSPITAL_BASED_OUTPATIENT_CLINIC_OR_DEPARTMENT_OTHER)
Admission: EM | Admit: 2016-12-10 | Discharge: 2016-12-10 | Disposition: A | Discharge status: Home / Self Care | Payer: Medicaid Other | Attending: Emergency Medicine | Admitting: Emergency Medicine

## 2016-12-10 ENCOUNTER — Encounter (HOSPITAL_COMMUNITY): Payer: Self-pay | Admitting: Gastroenterology

## 2016-12-10 DIAGNOSIS — N920 Excessive and frequent menstruation with regular cycle: Secondary | ICD-10-CM | POA: Diagnosis present

## 2016-12-10 DIAGNOSIS — D251 Intramural leiomyoma of uterus: Secondary | ICD-10-CM

## 2016-12-10 DIAGNOSIS — K5909 Other constipation: Secondary | ICD-10-CM | POA: Diagnosis not present

## 2016-12-10 DIAGNOSIS — F17210 Nicotine dependence, cigarettes, uncomplicated: Secondary | ICD-10-CM | POA: Diagnosis not present

## 2016-12-10 DIAGNOSIS — D250 Submucous leiomyoma of uterus: Secondary | ICD-10-CM

## 2016-12-10 DIAGNOSIS — Z79899 Other long term (current) drug therapy: Secondary | ICD-10-CM | POA: Diagnosis not present

## 2016-12-10 HISTORY — DX: Gastric ulcer, unspecified as acute or chronic, without hemorrhage or perforation: K25.9

## 2016-12-10 LAB — COMPREHENSIVE METABOLIC PANEL
ALT: 9 U/L — ABNORMAL LOW (ref 14–54)
AST: 14 U/L — ABNORMAL LOW (ref 15–41)
Albumin: 3.9 g/dL (ref 3.5–5.0)
Alkaline Phosphatase: 104 U/L (ref 38–126)
Anion gap: 7 (ref 5–15)
BUN: 10 mg/dL (ref 6–20)
CO2: 22 mmol/L (ref 22–32)
Calcium: 9.1 mg/dL (ref 8.9–10.3)
Chloride: 107 mmol/L (ref 101–111)
Creatinine, Ser: 0.45 mg/dL (ref 0.44–1.00)
GFR calc Af Amer: >60 mL/min (ref >60)
GFR calc non Af Amer: >60 mL/min (ref >60)
Glucose, Bld: 101 mg/dL — ABNORMAL HIGH (ref 65–99)
Potassium: 4 mmol/L (ref 3.5–5.1)
Sodium: 136 mmol/L (ref 135–145)
Total Bilirubin: 0.5 mg/dL (ref 0.3–1.2)
Total Protein: 8 g/dL (ref 6.5–8.1)

## 2016-12-10 LAB — CBC
HCT: 32.7 % — ABNORMAL LOW (ref 36.0–46.0)
Hemoglobin: 10.4 g/dL — ABNORMAL LOW (ref 12.0–15.0)
MCH: 21.2 pg — ABNORMAL LOW (ref 26.0–34.0)
MCHC: 31.8 g/dL (ref 30.0–36.0)
MCV: 66.7 fL — ABNORMAL LOW (ref 78.0–100.0)
Platelets: 443 10*3/uL — ABNORMAL HIGH (ref 150–400)
RBC: 4.9 MIL/uL (ref 3.87–5.11)
RDW: 25.4 % — ABNORMAL HIGH (ref 11.5–15.5)
WBC: 7.3 10*3/uL (ref 4.0–10.5)

## 2016-12-10 LAB — URINALYSIS, MICROSCOPIC (REFLEX)

## 2016-12-10 LAB — URINALYSIS, ROUTINE W REFLEX MICROSCOPIC
Bilirubin Urine: NEGATIVE
Glucose, UA: NEGATIVE mg/dL
Ketones, ur: NEGATIVE mg/dL
Leukocytes, UA: NEGATIVE
Nitrite: NEGATIVE
Protein, ur: NEGATIVE mg/dL
Specific Gravity, Urine: 1.014 (ref 1.005–1.030)
pH: 5.5 (ref 5.0–8.0)

## 2016-12-10 LAB — PREGNANCY, URINE: Preg Test, Ur: NEGATIVE

## 2016-12-10 LAB — LIPASE, BLOOD: Lipase: 21 U/L (ref 11–51)

## 2016-12-10 MED ORDER — MEGESTROL ACETATE 40 MG PO TABS: 40.0000 mg | ORAL_TABLET | Freq: Three times a day (TID) | ORAL | 0 refills | Status: DC

## 2016-12-10 MED ORDER — MAGNESIUM CITRATE PO SOLN: 1.0000 | Freq: Once | ORAL | 0 refills | Status: DC | PRN

## 2016-12-10 MED ORDER — POLYETHYLENE GLYCOL 3350 17 GM/SCOOP PO POWD: 17.0000 g | Freq: Every day | ORAL | 0 refills | Status: DC

## 2016-12-10 MED ORDER — KETOROLAC TROMETHAMINE 30 MG/ML IJ SOLN
30.0000 mg | Freq: Once | INTRAMUSCULAR | Status: AC
  Administered 2016-12-10: 30 mg via INTRAVENOUS
  Filled 2016-12-10: qty 1

## 2016-12-10 NOTE — ED Notes (Signed)
Patient transported to X-ray 

## 2016-12-10 NOTE — ED Provider Notes (Signed)
[facility] Provider Note   CSN: [id number] Arrival date & time: 12/10/16  1958 By signing my name below, I, Dyke Brackett, attest that this documentation has been prepared under the direction and in the presence of Forde Dandy, MD . Electronically Signed: Dyke Brackett, Scribe. 12/10/2016. 9:51 PM.   History   Chief Complaint Chief Complaint  Patient presents with  . Abdominal Pain   HPI Tiffany Christensen is a 36 y.o. female with a hx of uterine fibroids, ovarian cyst, chronic gastric ulcer, and menorrhagia who presents to the Emergency Department complaining of severe suprapubic abdominal pain onset today. Per pt, pain is exacerbated by movement. She has applied ice with no relief of pain. Pt states she started her period 4 days ago and is having heavy vaginal bleeding with blood clots today. She had gone through 5 pads today. She also notes associated lightheadedness. Pt was seen in the ED for the same last week and was hospitalized for anemia due to uterine fibroids. Pt states this pain is the same as with her fribroids. Her last BM was while she was in the hospital last week. Pt denies any nausea, vomiting, diarrhea, fever, or vaginal discharge. Pt has an appointment to f/u with gynecology in 4 days. Has not taken medications for symptoms.  The history is provided by the patient. No language interpreter was used.   Past Medical History:  Diagnosis Date  . Gastric ulcer   . Migraine   . Ovarian cyst   . Uterine fibroid     Patient Active Problem List   Diagnosis Date Noted  . 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 Menorrhagia 12/08/2016  . Anemia due to chronic blood loss 12/08/2016  . 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 Left sided abdominal pain   . Iron deficiency anemia due to chronic blood loss 12/05/2016    Past Surgical History:  Procedure Laterality Date  . BREAST SURGERY    . ESOPHAGOGASTRODUODENOSCOPY N/A 12/07/2016   Procedure: ESOPHAGOGASTRODUODENOSCOPY (EGD);  Surgeon: Doran Stabler, MD;  Location: [facility] ENDOSCOPY;  Service: Endoscopy;  Laterality: N/A;  . hemmorhoidectomy    . HEMORRHOID SURGERY    . HERNIA REPAIR      OB History    No data available      Home Medications    Prior to Admission medications   Medication Sig Start Date End Date Taking? Authorizing Provider  acetaminophen (TYLENOL) 325 MG tablet Take 2 tablets (650 mg total) by mouth every 6 (six) hours as needed for mild pain (or Fever >/= 101). 12/08/16   Debbe Odea, MD  alum & mag hydroxide-simeth (MAALOX/MYLANTA) 200-200-20 MG/5ML suspension Take 30 mLs by mouth every 6 (six) hours as needed for indigestion or heartburn. 12/08/16   Debbe Odea, MD  bisacodyl (DULCOLAX) 10 MG suppository Place 1 suppository (10 mg total) rectally every other day as needed for moderate constipation. 12/08/16   Debbe Odea, MD  ferrous sulfate 325 (65 FE) MG tablet Take 1 tablet (325 mg total) by mouth 2 (two) times daily with a meal. 12/08/16   Debbe Odea, MD  folic acid (FOLVITE) 1 MG tablet Take 1 tablet (1 mg total) by mouth daily. 12/09/16   Debbe Odea, MD  magnesium citrate SOLN Take 296 mLs (1 Bottle total) by mouth once as needed for severe constipation. 12/10/16   Forde Dandy, MD  megestrol (MEGACE) 40 MG tablet Take 1 tablet (40 mg total) by mouth  3 (three) times daily. Take 1 tablet 3 times daily until bleeding stops. Then take once daily until follow-up with your obstetrician 12/10/16   Forde Dandy, MD  pantoprazole (PROTONIX) 40 MG tablet Take 1 tablet (40 mg total) by mouth 2 (two) times daily. 12/08/16   Debbe Odea, MD  polyethylene glycol (MIRALAX) packet Take 17 g by mouth daily as needed for mild constipation. 12/08/16   Debbe Odea, MD  polyethylene glycol powder (MIRALAX) powder Take 17 g by mouth daily. Dissolve one capful of powder into any drink or soft food and take once daily. If no good effect in 2-3 days, take twice  daily. If no good effect in 2-3 days, take three times daily. 12/10/16   Forde Dandy, MD  senna-docusate (SENOKOT-S) 8.6-50 MG tablet Take 2 tablets by mouth at bedtime. 12/08/16   Debbe Odea, MD    Family History Family History  Problem Relation Age of Onset  . Hypertension Maternal Grandmother     Social History Social History  Substance Use Topics  . Smoking status: Current Every Day Smoker    Packs/day: 0.50    Types: Cigarettes  . Smokeless tobacco: Never Used  . Alcohol use No    Allergies   Codeine  Review of Systems Review of Systems 10 systems reviewed and all are negative for acute change except as noted in the HPI.  Physical Exam Updated Vital Signs BP 106/59 (BP Location: Right Arm)   Pulse 85   Temp 98.2 F (36.8 C) (Oral)   Resp 16   Ht 5\' 5"  (1.651 m)   Wt 136 lb (61.7 kg)   LMP 11/08/2016 (Exact Date) Comment: neg upreg  SpO2 100%   BMI 22.63 kg/m   Physical Exam Physical Exam  Nursing note and vitals reviewed. Constitutional: Well developed, well nourished, non-toxic, and in no acute distress Head: Normocephalic and atraumatic.  Mouth/Throat: Oropharynx is clear and moist.  Neck: Normal range of motion. Neck supple.  Cardiovascular: Normal rate and regular rhythm.   Pulmonary/Chest: Effort normal and breath sounds normal.  Abdominal: Soft. There is tenderness in suprapubic region and left adnexa of abdomen. There is no rebound and no guarding. Mildly distended.  Pelvic: Normal external genitalia. Normal internal genitalia. No discharge. Small amount of blood and blot clot in vagina without active bleeding. No cervical motion tenderness. No adnexal masses but with left adnexal tenderness. Musculoskeletal: Normal range of motion.  Neurological: Alert, no facial droop, fluent speech, moves all extremities symmetrically Skin: Skin is warm and dry.  Psychiatric: Cooperative   ED Treatments / Results  DIAGNOSTIC STUDIES:  Oxygen Saturation is  100% on RA, normal by my interpretation.    COORDINATION OF CARE:  9:40 PM Discussed treatment plan with pt at bedside and pt agreed to plan.   Labs (all labs ordered are listed, but only abnormal results are displayed) Labs Reviewed  URINALYSIS, ROUTINE W REFLEX MICROSCOPIC - Abnormal; Notable for the following:       Result Value   APPearance CLOUDY (*)    Hgb urine dipstick LARGE (*)    All other components within normal limits  URINALYSIS, MICROSCOPIC (REFLEX) - Abnormal; Notable for the following:    Bacteria, UA RARE (*)    Squamous Epithelial / LPF 0-5 (*)    All other components within normal limits  COMPREHENSIVE METABOLIC PANEL - Abnormal; Notable for the following:    Glucose, Bld 101 (*)    AST 14 (*)  ALT 9 (*)    All other components within normal limits  CBC - Abnormal; Notable for the following:    Hemoglobin 10.4 (*)    HCT 32.7 (*)    MCV 66.7 (*)    MCH 21.2 (*)    RDW 25.4 (*)    Platelets 443 (*)    All other components within normal limits  PREGNANCY, URINE  LIPASE, BLOOD    EKG  EKG Interpretation None       Radiology Dg Abdomen Acute W/chest  Result Date: 12/10/2016 CLINICAL DATA:  Lower abdominal pain with constipation EXAM: DG ABDOMEN ACUTE W/ 1V CHEST COMPARISON:  12/05/2016 FINDINGS: Single-view chest demonstrates no acute infiltrate, consolidation or effusion. Normal heart size. Supine upright views of the abdomen demonstrate no free air beneath the diaphragm. Nonobstructed bowel gas pattern with large amount of stool in the colon. Retained contrast in the left colon. Calcified left pelvic phleboliths. Surgical suture material in the low pelvis. IMPRESSION: 1. Single-view chest is within normal limits 2. Nonobstructed gas pattern with moderate to large amount of stool in the colon. Residual contrast is present within the left colon from recent CT. Electronically Signed   By: Donavan Foil M.D.   On: 12/10/2016 22:37     Procedures Procedures (including critical care time)  Medications Ordered in ED Medications  ketorolac (TORADOL) 30 MG/ML injection 30 mg (30 mg Intravenous Given 12/10/16 2150)     Initial Impression / Assessment and Plan / ED Course  I have reviewed the triage vital signs and the nursing notes.  Pertinent labs & imaging results that were available during my care of the patient were reviewed by me and considered in my medical decision making (see chart for details).  Clinical Course    Records reviewed. Discharged 12/08/16 for hospitalization for acute blood loss anemia due to menorrhagia, requiring blood transfusion. With abdominal pain at that time, same as now, and on CT abdomen/pelvis 12/27 with uterine fibroids and left ovarian cyst without torsion on ultrasound subsequently. She has not had bowel movement in several days as well, and on discharge summary, there was concern that this was also contributing to her pain, and currently only take ex-lax every other day.   In ED, she is non-toxic and in no acute distress. Vitals within normal limits. Abdomen soft with primarily suprapubic tenderness to palpation. Pelvic exam with small amount of bleeding. Blood work shows improving anemia to 10.4. Discharge hgb 9.6. Remainder of blood work reassuring. Symptoms likely persistent from her fibroids and menorrhagia, given toradol with some improvement. She has follow-up with GYN in 3 days for follow-up, given her concern for ongoing bleeding and blood clots she was started on megace until follow-up with gynecology. Also concern for persistent constipation and started on miralax bowel regimen with prn magnesium citrate.   She is felt stable for continued outpatient management with gyn. Strict return and follow-up instructions reviewed. She expressed understanding of all discharge instructions and felt comfortable with the plan of care.     Final Clinical Impressions(s) / ED Diagnoses    Final diagnoses:  Menorrhagia with regular cycle  Intramural and submucous leiomyoma of uterus  Other constipation    New Prescriptions Discharge Medication List as of 12/10/2016 11:38 PM    START taking these medications   Details  magnesium citrate SOLN Take 296 mLs (1 Bottle total) by mouth once as needed for severe constipation., Starting Mon 12/10/2016, Print    megestrol (MEGACE) 40 MG tablet  Take 1 tablet (40 mg total) by mouth 3 (three) times daily. Take 1 tablet 3 times daily until bleeding stops. Then take once daily until follow-up with your obstetrician, Starting Mon 12/10/2016, Print    polyethylene glycol powder (MIRALAX) powder Take 17 g by mouth daily. Dissolve one capful of powder into any drink or soft food and take once daily. If no good effect in 2-3 days, take twice daily. If no good effect in 2-3 days, take three times daily., Starting Mon 12/10/2016, Print       I personally performed the services described in this documentation, which was scribed in my presence. The recorded information has been reviewed and is accurate.    Forde Dandy, MD 12/11/16 0030

## 2016-12-10 NOTE — Discharge Instructions (Signed)
Please see your gynecologist on Thursday as scheduled. You can take megace to help stop your bleeding until you follow-up with your gynecologist.  Please take miralax as prescribed for constipation. If you are not having a bowel movement in 4-6 days of using miralax, you can take a bottle of magnesium citrate.  Please return without fail for worsening symptoms, including fever, intractable vomiting, escalating pain, worsening bleeding (greater than 1 superpad in under 1 hour), or any other symptoms concerning to you.

## 2016-12-10 NOTE — ED Notes (Signed)
ED Provider at bedside. 

## 2016-12-10 NOTE — ED Triage Notes (Signed)
Abdominal pain that started today.  Pt admitted to hospital last week with same.  Limited triage due to pt being on her phone in triage.

## 2016-12-11 MED FILL — FERROUS SULFATE 325 MG TAB: 325 (65 FE) | 3 days supply | Qty: 60 | Fill #0

## 2016-12-11 MED FILL — FOLIC ACID 1 MG TABLET: 1 | 30 days supply | Qty: 30 | Fill #0

## 2016-12-11 MED FILL — ?PANTOPRAZOLE SOD DR 40MG: 40 MG | 30 days supply | Qty: 60 | Fill #0

## 2016-12-11 MED FILL — POLYETHYLENE GLYCOL 3350 PO: 22 days supply | Qty: 527 | Fill #0

## 2016-12-13 ENCOUNTER — Encounter: Payer: Self-pay | Admitting: Gastroenterology

## 2016-12-13 NOTE — Progress Notes (Signed)
Letter mailed

## 2016-12-18 ENCOUNTER — Inpatient Hospital Stay: Payer: Medicaid Other | Admitting: Family Medicine

## 2017-01-12 ENCOUNTER — Encounter (HOSPITAL_BASED_OUTPATIENT_CLINIC_OR_DEPARTMENT_OTHER): Payer: Self-pay | Admitting: Emergency Medicine

## 2017-01-12 DIAGNOSIS — R102 Pelvic and perineal pain: Secondary | ICD-10-CM | POA: Diagnosis not present

## 2017-01-12 DIAGNOSIS — S3991XA Unspecified injury of abdomen, initial encounter: Secondary | ICD-10-CM | POA: Diagnosis present

## 2017-01-12 DIAGNOSIS — W1839XA Other fall on same level, initial encounter: Secondary | ICD-10-CM | POA: Diagnosis not present

## 2017-01-12 DIAGNOSIS — Y9302 Activity, running: Secondary | ICD-10-CM | POA: Insufficient documentation

## 2017-01-12 DIAGNOSIS — Y929 Unspecified place or not applicable: Secondary | ICD-10-CM | POA: Diagnosis not present

## 2017-01-12 DIAGNOSIS — Y999 Unspecified external cause status: Secondary | ICD-10-CM | POA: Insufficient documentation

## 2017-01-12 DIAGNOSIS — Z79899 Other long term (current) drug therapy: Secondary | ICD-10-CM | POA: Insufficient documentation

## 2017-01-12 DIAGNOSIS — F17210 Nicotine dependence, cigarettes, uncomplicated: Secondary | ICD-10-CM | POA: Diagnosis not present

## 2017-01-12 NOTE — ED Triage Notes (Signed)
Patient states that she had a Hysterectomy 3 weeks ago. She states that she fell today and she is having pain to her abdominal area and noted some bleeding "when I wiped" earlier. The patient is walking with a limp

## 2017-01-13 ENCOUNTER — Emergency Department (HOSPITAL_BASED_OUTPATIENT_CLINIC_OR_DEPARTMENT_OTHER)
Admission: EM | Admit: 2017-01-13 | Discharge: 2017-01-13 | Disposition: A | Discharge status: Home / Self Care | Payer: Medicaid Other | Attending: Emergency Medicine | Admitting: Emergency Medicine

## 2017-01-13 ENCOUNTER — Emergency Department (HOSPITAL_BASED_OUTPATIENT_CLINIC_OR_DEPARTMENT_OTHER): Payer: Medicaid Other

## 2017-01-13 DIAGNOSIS — R102 Pelvic and perineal pain: Secondary | ICD-10-CM

## 2017-01-13 LAB — COMPREHENSIVE METABOLIC PANEL
ALT: 10 U/L — ABNORMAL LOW (ref 14–54)
AST: 11 U/L — ABNORMAL LOW (ref 15–41)
Albumin: 3.7 g/dL (ref 3.5–5.0)
Alkaline Phosphatase: 85 U/L (ref 38–126)
Anion gap: 6 (ref 5–15)
BUN: 15 mg/dL (ref 6–20)
CO2: 21 mmol/L — ABNORMAL LOW (ref 22–32)
Calcium: 8.8 mg/dL — ABNORMAL LOW (ref 8.9–10.3)
Chloride: 108 mmol/L (ref 101–111)
Creatinine, Ser: 0.52 mg/dL (ref 0.44–1.00)
GFR calc Af Amer: >60 mL/min (ref >60)
GFR calc non Af Amer: >60 mL/min (ref >60)
Glucose, Bld: 91 mg/dL (ref 65–99)
Potassium: 3.8 mmol/L (ref 3.5–5.1)
Sodium: 135 mmol/L (ref 135–145)
Total Bilirubin: 0.4 mg/dL (ref 0.3–1.2)
Total Protein: 7 g/dL (ref 6.5–8.1)

## 2017-01-13 LAB — URINALYSIS, MICROSCOPIC (REFLEX)

## 2017-01-13 LAB — WET PREP, GENITAL
Clue Cells Wet Prep HPF POC: NONE SEEN
Sperm: NONE SEEN
Trich, Wet Prep: NONE SEEN

## 2017-01-13 LAB — CBC WITH DIFFERENTIAL/PLATELET
Basophils Absolute: 0.1 10*3/uL (ref 0.0–0.1)
Basophils Relative: 1 %
Eosinophils Absolute: 0.1 10*3/uL (ref 0.0–0.7)
Eosinophils Relative: 2 %
HCT: 32.1 % — ABNORMAL LOW (ref 36.0–46.0)
Hemoglobin: 10.5 g/dL — ABNORMAL LOW (ref 12.0–15.0)
Lymphocytes Relative: 44 %
Lymphs Abs: 2.8 10*3/uL (ref 0.7–4.0)
MCH: 24.1 pg — ABNORMAL LOW (ref 26.0–34.0)
MCHC: 32.7 g/dL (ref 30.0–36.0)
MCV: 73.8 fL — ABNORMAL LOW (ref 78.0–100.0)
Monocytes Absolute: 0.6 10*3/uL (ref 0.1–1.0)
Monocytes Relative: 10 %
Neutro Abs: 2.7 10*3/uL (ref 1.7–7.7)
Neutrophils Relative %: 43 %
Platelets: 265 10*3/uL (ref 150–400)
RBC: 4.35 MIL/uL (ref 3.87–5.11)
RDW: 24.9 % — ABNORMAL HIGH (ref 11.5–15.5)
WBC: 6.3 10*3/uL (ref 4.0–10.5)

## 2017-01-13 LAB — URINALYSIS, ROUTINE W REFLEX MICROSCOPIC
Bilirubin Urine: NEGATIVE
Glucose, UA: 100 mg/dL — AB
Ketones, ur: NEGATIVE mg/dL
Leukocytes, UA: NEGATIVE
Nitrite: NEGATIVE
Protein, ur: NEGATIVE mg/dL
Specific Gravity, Urine: 1.018 (ref 1.005–1.030)
pH: 6 (ref 5.0–8.0)

## 2017-01-13 LAB — I-STAT CG4 LACTIC ACID, ED: Lactic Acid, Venous: 0.73 mmol/L (ref 0.5–1.9)

## 2017-01-13 LAB — LIPASE, BLOOD: Lipase: 30 U/L (ref 11–51)

## 2017-01-13 MED ORDER — FLUCONAZOLE 50 MG PO TABS
150.0000 mg | ORAL_TABLET | Freq: Once | ORAL | Status: AC
  Administered 2017-01-13: 150 mg via ORAL
  Filled 2017-01-13: qty 1

## 2017-01-13 MED ORDER — ONDANSETRON HCL 4 MG/2ML IJ SOLN
4.0000 mg | Freq: Once | INTRAMUSCULAR | Status: AC
  Administered 2017-01-13: 4 mg via INTRAVENOUS
  Filled 2017-01-13: qty 2

## 2017-01-13 MED ORDER — SODIUM CHLORIDE 0.9 % IV BOLUS (SEPSIS)
1000.0000 mL | Freq: Once | INTRAVENOUS | Status: AC
  Administered 2017-01-13: 1000 mL via INTRAVENOUS

## 2017-01-13 MED ORDER — IOPAMIDOL (ISOVUE-300) INJECTION 61%
100.0000 mL | Freq: Once | INTRAVENOUS | Status: AC | PRN
  Administered 2017-01-13: 100 mL via INTRAVENOUS

## 2017-01-13 MED ORDER — MORPHINE SULFATE (PF) 4 MG/ML IV SOLN
4.0000 mg | Freq: Once | INTRAVENOUS | Status: AC
  Administered 2017-01-13: 4 mg via INTRAVENOUS
  Filled 2017-01-13: qty 1

## 2017-01-13 NOTE — ED Notes (Signed)
Patient transported to CT 

## 2017-01-13 NOTE — ED Notes (Signed)
MD does not need lactic ordered for 0438 per his order.

## 2017-01-13 NOTE — ED Notes (Signed)
Abd soft. Tender lower abd along incision site. Worse on left. BS x 4

## 2017-01-13 NOTE — ED Provider Notes (Signed)
[facility] Provider Note   CSN: [id number] Arrival date & time: 01/12/17  2219     History   Chief Complaint Chief Complaint  Patient presents with  . Fall    HPI Tiffany Christensen is a 36 y.o. female.  36 yo F with a chief complaint of diffuse abdominal pain. This occurred after she fell earlier today. She thinks she fell onto her bottom. Has been having dysuria and increased frequency and urgency. Patient had runs the bathroom and she lost her balance and fell. Denies head injury denies loss of consciousness. Denies chest pain back pain. Complaining of severe abdominal pain. Has had severe abdominal pain with bowel movements but denies issues otherwise post hysterectomy about a month ago. Has had some spotting as well.   The history is provided by the patient.  Fall  This is a new problem. The current episode started 1 to 2 hours ago. The problem occurs constantly. The problem has not changed since onset.Associated symptoms include abdominal pain. Pertinent negatives include no chest pain, no headaches and no shortness of breath. Nothing aggravates the symptoms. Nothing relieves the symptoms. She has tried nothing for the symptoms. The treatment provided no relief.    Past Medical History:  Diagnosis Date  . Gastric ulcer   . Migraine   . Ovarian cyst   . Uterine fibroid     Patient Active Problem List   Diagnosis Date Noted  . 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 Menorrhagia 12/08/2016  . Anemia due to chronic blood loss 12/08/2016  . 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 Left sided abdominal pain   . Iron deficiency anemia due to chronic blood loss 12/05/2016    Past Surgical History:  Procedure Laterality Date  . BREAST SURGERY    . ESOPHAGOGASTRODUODENOSCOPY N/A 12/07/2016   Procedure: ESOPHAGOGASTRODUODENOSCOPY (EGD);  Surgeon: Doran Stabler, MD;  Location: [facility] ENDOSCOPY;  Service: Endoscopy;  Laterality: N/A;  .  hemmorhoidectomy    . HEMORRHOID SURGERY    . HERNIA REPAIR      OB History    No data available       Home Medications    Prior to Admission medications   Medication Sig Start Date End Date Taking? Authorizing Provider  acetaminophen (TYLENOL) 325 MG tablet Take 2 tablets (650 mg total) by mouth every 6 (six) hours as needed for mild pain (or Fever >/= 101). 12/08/16   Debbe Odea, MD  alum & mag hydroxide-simeth (MAALOX/MYLANTA) 200-200-20 MG/5ML suspension Take 30 mLs by mouth every 6 (six) hours as needed for indigestion or heartburn. 12/08/16   Debbe Odea, MD  bisacodyl (DULCOLAX) 10 MG suppository Place 1 suppository (10 mg total) rectally every other day as needed for moderate constipation. 12/08/16   Debbe Odea, MD  ferrous sulfate 325 (65 FE) MG tablet Take 1 tablet (325 mg total) by mouth 2 (two) times daily with a meal. 12/08/16   Debbe Odea, MD  folic acid (FOLVITE) 1 MG tablet Take 1 tablet (1 mg total) by mouth daily. 12/09/16   Debbe Odea, MD  magnesium citrate SOLN Take 296 mLs (1 Bottle total) by mouth once as needed for severe constipation. 12/10/16   Forde Dandy, MD  megestrol (MEGACE) 40 MG tablet Take 1 tablet (40 mg total) by mouth 3 (three) times daily. Take 1 tablet 3 times daily until bleeding stops. Then take once daily until follow-up with your obstetrician 12/10/16   Hinton Dyer  Roderic Ovens, MD  pantoprazole (PROTONIX) 40 MG tablet Take 1 tablet (40 mg total) by mouth 2 (two) times daily. 12/08/16   Debbe Odea, MD  polyethylene glycol (MIRALAX) packet Take 17 g by mouth daily as needed for mild constipation. 12/08/16   Debbe Odea, MD  polyethylene glycol powder (MIRALAX) powder Take 17 g by mouth daily. Dissolve one capful of powder into any drink or soft food and take once daily. If no good effect in 2-3 days, take twice daily. If no good effect in 2-3 days, take three times daily. 12/10/16   Forde Dandy, MD  senna-docusate (SENOKOT-S) 8.6-50 MG tablet Take 2  tablets by mouth at bedtime. 12/08/16   Debbe Odea, MD    Family History Family History  Problem Relation Age of Onset  . Hypertension Maternal Grandmother     Social History Social History  Substance Use Topics  . Smoking status: Current Every Day Smoker    Packs/day: 0.50    Types: Cigarettes  . Smokeless tobacco: Never Used  . Alcohol use No     Allergies   Codeine   Review of Systems Review of Systems  Constitutional: Negative for chills and fever.  HENT: Negative for congestion and rhinorrhea.   Eyes: Negative for redness and visual disturbance.  Respiratory: Negative for shortness of breath and wheezing.   Cardiovascular: Negative for chest pain and palpitations.  Gastrointestinal: Positive for abdominal pain. Negative for nausea and vomiting.  Genitourinary: Positive for dysuria, frequency and urgency.  Musculoskeletal: Negative for arthralgias and myalgias.  Skin: Negative for pallor and wound.  Neurological: Negative for dizziness and headaches.     Physical Exam Updated Vital Signs BP 105/68   Pulse 86   Temp 97.8 F (36.6 C) (Oral)   Resp 16   Ht 5\' 5"  (1.651 m)   Wt 133 lb (60.3 kg)   LMP 11/08/2016 (Exact Date) Comment: neg upreg  SpO2 99%   BMI 22.13 kg/m   Physical Exam  Constitutional: She is oriented to person, place, and time. She appears well-developed and well-nourished. No distress.  HENT:  Head: Normocephalic and atraumatic.  Eyes: EOM are normal. Pupils are equal, round, and reactive to light.  Neck: Normal range of motion. Neck supple.  Cardiovascular: Normal rate and regular rhythm.  Exam reveals no gallop and no friction rub.   No murmur heard. Pulmonary/Chest: Effort normal. She has no wheezes. She has no rales.  Abdominal: Soft. She exhibits no distension and no mass. There is tenderness (diffuse, severe). There is no guarding.  Genitourinary:  Genitourinary Comments: Diffuse tenderness on pelvic exam. Some whitish  discharge.  Musculoskeletal: She exhibits no edema or tenderness.  Neurological: She is alert and oriented to person, place, and time.  Skin: Skin is warm and dry. She is not diaphoretic.  Psychiatric: She has a normal mood and affect. Her behavior is normal.  Nursing note and vitals reviewed.    ED Treatments / Results  Labs (all labs ordered are listed, but only abnormal results are displayed) Labs Reviewed  WET PREP, GENITAL - Abnormal; Notable for the following:       Result Value   Yeast Wet Prep HPF POC PRESENT (*)    WBC, Wet Prep HPF POC MANY (*)    All other components within normal limits  CBC WITH DIFFERENTIAL/PLATELET - Abnormal; Notable for the following:    Hemoglobin 10.5 (*)    HCT 32.1 (*)    MCV 73.8 (*)  MCH 24.1 (*)    RDW 24.9 (*)    All other components within normal limits  COMPREHENSIVE METABOLIC PANEL - Abnormal; Notable for the following:    CO2 21 (*)    Calcium 8.8 (*)    AST 11 (*)    ALT 10 (*)    All other components within normal limits  URINALYSIS, ROUTINE W REFLEX MICROSCOPIC - Abnormal; Notable for the following:    Glucose, UA 100 (*)    Hgb urine dipstick LARGE (*)    All other components within normal limits  URINALYSIS, MICROSCOPIC (REFLEX) - Abnormal; Notable for the following:    Bacteria, UA FEW (*)    Squamous Epithelial / LPF 0-5 (*)    All other components within normal limits  URINE CULTURE  LIPASE, BLOOD  I-STAT CG4 LACTIC ACID, ED  I-STAT CG4 LACTIC ACID, ED  GC/CHLAMYDIA PROBE AMP ([place]) NOT AT [facility]    EKG  EKG Interpretation None       Radiology Ct Abdomen Pelvis W Contrast  Result Date: 01/13/2017 CLINICAL DATA:  Three weeks postop from hysterectomy. Fell backward tonight, now with lower abdominal pain and vaginal bleeding. EXAM: CT ABDOMEN AND PELVIS WITH CONTRAST TECHNIQUE: Multidetector CT imaging of the abdomen and pelvis was performed using the standard protocol following bolus administration of  intravenous contrast. CONTRAST:  150mL ISOVUE-300 IOPAMIDOL (ISOVUE-300) INJECTION 61% COMPARISON:  12/05/2016 FINDINGS: Lower chest: No acute abnormality. Hepatobiliary: No focal liver abnormality is seen. No gallstones, gallbladder wall thickening, or biliary dilatation. Pancreas: Unremarkable. No pancreatic ductal dilatation or surrounding inflammatory changes. Spleen: Normal in size without focal abnormality. Adrenals/Urinary Tract: Adrenal glands are unremarkable. Kidneys are normal, without renal calculi, focal lesion, or hydronephrosis. Bladder is unremarkable. Stomach/Bowel: Stomach, small bowel and colon are unremarkable except for distal rectal surgical clips. Vascular/Lymphatic: No significant vascular findings are present. No enlarged abdominal or pelvic lymph nodes. Reproductive: Status post hysterectomy. No adnexal masses. Other: No hematoma or other abnormal collection in the abdomen or pelvis. Minimal stranding opacity in the anterior abdominal wall subcutaneous fat may relate to surgical incision. No incisional collection or gas. Musculoskeletal: No fracture.  No significant skeletal lesion. IMPRESSION: Unremarkable postoperative changes after hysterectomy. No evidence of complication. No evidence of significant traumatic injury. Electronically Signed   By: Andreas Newport M.D.   On: 01/13/2017 03:33    Procedures Procedures (including critical care time)  Medications Ordered in ED Medications  sodium chloride 0.9 % bolus 1,000 mL (0 mLs Intravenous Stopped 01/13/17 0447)  morphine 4 MG/ML injection 4 mg (4 mg Intravenous Given 01/13/17 0222)  ondansetron (ZOFRAN) injection 4 mg (4 mg Intravenous Given 01/13/17 0219)  iopamidol (ISOVUE-300) 61 % injection 100 mL (100 mLs Intravenous Contrast Given 01/13/17 0309)  fluconazole (DIFLUCAN) tablet 150 mg (150 mg Oral Given 01/13/17 0456)     Initial Impression / Assessment and Plan / ED Course  I have reviewed the triage vital signs and the  nursing notes.  Pertinent labs & imaging results that were available during my care of the patient were reviewed by me and considered in my medical decision making (see chart for details).     36 yo F With a chief complaint of diffuse abdominal pain after mechanical fall. Patient having pain out of proportion to my exam. Will obtain a CT scan.  CT is negative. Pelvic exam with some significant tenderness. Found to have Yeast on wet prep. Will give a dose of Diflucan. As patient is having  much more pain than expected postop suggested that she follow-up with her surgeon.  5:00 AM:  I have discussed the diagnosis/risks/treatment options with the patient and family and believe the pt to be eligible for discharge home to follow-up with PCP. We also discussed returning to the ED immediately if new or worsening sx occur. We discussed the sx which are most concerning (e.g., sudden worsening pain, fever, inability to tolerate by mouth) that necessitate immediate return. Medications administered to the patient during their visit and any new prescriptions provided to the patient are listed below.  Medications given during this visit Medications  sodium chloride 0.9 % bolus 1,000 mL (0 mLs Intravenous Stopped 01/13/17 0447)  morphine 4 MG/ML injection 4 mg (4 mg Intravenous Given 01/13/17 0222)  ondansetron (ZOFRAN) injection 4 mg (4 mg Intravenous Given 01/13/17 0219)  iopamidol (ISOVUE-300) 61 % injection 100 mL (100 mLs Intravenous Contrast Given 01/13/17 0309)  fluconazole (DIFLUCAN) tablet 150 mg (150 mg Oral Given 01/13/17 0456)     The patient appears reasonably screen and/or stabilized for discharge and I doubt any other medical condition or other [facility] requiring further screening, evaluation, or treatment in the ED at this time prior to discharge.    Final Clinical Impressions(s) / ED Diagnoses   Final diagnoses:  Pelvic pain    New Prescriptions New Prescriptions   No medications on file       Deno Etienne, DO 01/13/17 0500

## 2017-01-13 NOTE — ED Notes (Signed)
Pt called for her ride home.

## 2017-01-13 NOTE — Discharge Instructions (Signed)
Take 4 over the counter ibuprofen tablets 3 times a day or 2 over-the-counter naproxen tablets twice a day for pain. Also take tylenol 1000mg (2 extra strength) four times a day.   I feel that your pain is more severe than I would expect this far postoperatively. Please discuss this with your OB/GYN.

## 2017-01-13 NOTE — ED Notes (Signed)
ED Provider at bedside. 

## 2017-01-14 LAB — URINE CULTURE: Culture: <10000 — AB

## 2017-01-14 LAB — GC/CHLAMYDIA PROBE AMP (~~LOC~~) NOT AT ARMC
Chlamydia: NEGATIVE
Neisseria Gonorrhea: NEGATIVE

## 2017-05-02 ENCOUNTER — Emergency Department (HOSPITAL_BASED_OUTPATIENT_CLINIC_OR_DEPARTMENT_OTHER)
Admission: EM | Admit: 2017-05-02 | Discharge: 2017-05-02 | Disposition: A | Discharge status: Home / Self Care | Payer: Medicaid Other | Attending: Emergency Medicine | Admitting: Emergency Medicine

## 2017-05-02 ENCOUNTER — Encounter (HOSPITAL_BASED_OUTPATIENT_CLINIC_OR_DEPARTMENT_OTHER): Payer: Self-pay | Admitting: *Deleted

## 2017-05-02 DIAGNOSIS — F17210 Nicotine dependence, cigarettes, uncomplicated: Secondary | ICD-10-CM | POA: Insufficient documentation

## 2017-05-02 DIAGNOSIS — G43409 Hemiplegic migraine, not intractable, without status migrainosus: Secondary | ICD-10-CM | POA: Insufficient documentation

## 2017-05-02 DIAGNOSIS — R51 Headache: Secondary | ICD-10-CM | POA: Diagnosis present

## 2017-05-02 DIAGNOSIS — Z79899 Other long term (current) drug therapy: Secondary | ICD-10-CM | POA: Diagnosis not present

## 2017-05-02 DIAGNOSIS — R0789 Other chest pain: Secondary | ICD-10-CM | POA: Diagnosis not present

## 2017-05-02 MED ORDER — SODIUM CHLORIDE 0.9 % IV BOLUS (SEPSIS)
1000.0000 mL | Freq: Once | INTRAVENOUS | Status: AC
  Administered 2017-05-02: 1000 mL via INTRAVENOUS

## 2017-05-02 MED ORDER — KETOROLAC TROMETHAMINE 15 MG/ML IJ SOLN
15.0000 mg | Freq: Once | INTRAMUSCULAR | Status: AC
  Administered 2017-05-02: 15 mg via INTRAVENOUS
  Filled 2017-05-02: qty 1

## 2017-05-02 MED ORDER — DIPHENHYDRAMINE HCL 50 MG/ML IJ SOLN
25.0000 mg | Freq: Once | INTRAMUSCULAR | Status: AC
  Administered 2017-05-02: 25 mg via INTRAVENOUS
  Filled 2017-05-02: qty 1

## 2017-05-02 MED ORDER — METOCLOPRAMIDE HCL 5 MG/ML IJ SOLN
10.0000 mg | Freq: Once | INTRAMUSCULAR | Status: AC
  Administered 2017-05-02: 10 mg via INTRAVENOUS
  Filled 2017-05-02: qty 2

## 2017-05-02 MED ORDER — GI COCKTAIL ~~LOC~~
30.0000 mL | Freq: Once | ORAL | Status: AC
  Administered 2017-05-02: 30 mL via ORAL
  Filled 2017-05-02: qty 30

## 2017-05-02 NOTE — ED Provider Notes (Signed)
[facility] DEPT [facility] Provider Note: [name], MD, FACEP  CSN: [id number] MRN: [id number] ARRIVAL: 05/02/17 at [place]: [place]  Headache   HISTORY OF PRESENT ILLNESS  Tiffany Christensen is a 36 y.o. female with a history of migraines. She is here with a headache that began yesterday about 12 hours ago. The pain is located on the right side of her head and is consistent with prior migraines. She rates the pain as an 8 out of 10. Pain is worse with exposure to light. She has associated nausea but no vomiting. She subsequently developed pain in her chest. The pain is located in her sternal area and she describes it as a pulling or tightening sensation. It is somewhat intermittent. There is no associated shortness of breath or pleuritic component. The pain does not radiate. There is no associated diaphoresis.   Past Medical History:  Diagnosis Date  . Gastric ulcer   . Migraine   . Ovarian cyst   . Uterine fibroid     Past Surgical History:  Procedure Laterality Date  . ABDOMINAL HYSTERECTOMY    . BREAST SURGERY    . ESOPHAGOGASTRODUODENOSCOPY N/A 12/07/2016   Procedure: ESOPHAGOGASTRODUODENOSCOPY (EGD);  Surgeon: Doran Stabler, MD;  Location: [facility] ENDOSCOPY;  Service: Endoscopy;  Laterality: N/A;  . hemmorhoidectomy    . HEMORRHOID SURGERY    . HERNIA REPAIR      Family History  Problem Relation Age of Onset  . Hypertension Maternal Grandmother     Social History  Substance Use Topics  . Smoking status: Current Every Day Smoker    Packs/day: 0.50    Types: Cigarettes  . Smokeless tobacco: Never Used  . Alcohol use No    Prior to Admission medications   Medication Sig Start Date End Date Taking? Authorizing Provider  pantoprazole (PROTONIX) 40 MG tablet Take 1 tablet (40 mg total) by mouth 2 (two) times daily. 12/08/16   Debbe Odea, MD    Allergies Codeine   REVIEW OF SYSTEMS  Negative except as noted here or in the History of  Present Illness.   PHYSICAL EXAMINATION  Initial Vital Signs Blood pressure (!) 135/92, pulse 74, temperature 97.8 F (36.6 C), temperature source Oral, resp. rate 18, height 5\' 5"  (1.651 m), weight 60.3 kg (133 lb), last menstrual period 11/08/2016, SpO2 98 %.  Examination General: Well-developed, well-nourished female in no acute distress; appearance consistent with age of record HENT: normocephalic; atraumatic Eyes: pupils equal, round and reactive to light; extraocular muscles intact; photophobia Neck: supple Heart: regular rate and rhythm Lungs: clear to auscultation bilaterally Chest: Sternal tenderness which reproduces the patient's pain Abdomen: soft; nondistended; nontender; no masses or hepatosplenomegaly; bowel sounds present Extremities: No deformity; full range of motion; pulses normal Neurologic: Awake, alert and oriented; motor function intact in all extremities and symmetric; no facial droop Skin: Warm and dry Psychiatric: Normal mood and affect   RESULTS  Summary of this visit's results, reviewed by myself:   EKG Interpretation  Date/Time:  Thursday May 02 2017 05:27:54 EDT Ventricular Rate:  76 PR Interval:    QRS Duration: 81 QT Interval:  375 QTC Calculation: 422 R Axis:   71 Text Interpretation:  Sinus rhythm Normal ECG No significant change was found Confirmed by Eliah Marquard [phone]) on 05/02/2017 5:31:02 AM      Laboratory Studies: No results found for this or any previous visit (from the past 24 hour(s)). Imaging Studies: No results found.  ED COURSE  Nursing notes and initial vitals signs, including pulse oximetry, reviewed.  Vitals:   05/02/17 0520 05/02/17 0521 05/02/17 0522  BP:  (!) 135/92   Pulse: 69 74   Resp: 18 18   Temp: 97.7 F (36.5 C) 97.8 F (36.6 C)   TempSrc: Oral Oral   SpO2:  98%   Weight:   60.3 kg (133 lb)  Height:   5\' 5"  (1.651 m)   6:22 AM Patient's headache and chest pain significantly improved after IV  medications. I suspect her chest pain was due to costochondritis improved more with Toradol but it did with GI cocktail. She is scheduled for an upper endoscopy tomorrow morning.   PROCEDURES    ED DIAGNOSES     ICD-9-CM ICD-10-CM   1. Sporadic migraine 346.30 G43.409   2. Anterior chest wall pain 786.52 R07.89        Shanon Rosser, MD 05/02/17 [phone]

## 2017-05-02 NOTE — ED Triage Notes (Signed)
Pt with center chest pain and migraine HA x 12 hours sx are accompanied by nausea denies SHOB diaphoresis denies radiation

## 2017-06-11 ENCOUNTER — Encounter (HOSPITAL_BASED_OUTPATIENT_CLINIC_OR_DEPARTMENT_OTHER): Payer: Self-pay | Admitting: *Deleted

## 2017-06-11 ENCOUNTER — Emergency Department (HOSPITAL_BASED_OUTPATIENT_CLINIC_OR_DEPARTMENT_OTHER)
Admission: EM | Admit: 2017-06-11 | Discharge: 2017-06-11 | Disposition: A | Discharge status: Home / Self Care | Payer: Medicaid Other | Attending: Emergency Medicine | Admitting: Emergency Medicine

## 2017-06-11 DIAGNOSIS — R51 Headache: Secondary | ICD-10-CM | POA: Diagnosis present

## 2017-06-11 DIAGNOSIS — F17210 Nicotine dependence, cigarettes, uncomplicated: Secondary | ICD-10-CM | POA: Insufficient documentation

## 2017-06-11 DIAGNOSIS — Z79899 Other long term (current) drug therapy: Secondary | ICD-10-CM | POA: Diagnosis not present

## 2017-06-11 DIAGNOSIS — G43409 Hemiplegic migraine, not intractable, without status migrainosus: Secondary | ICD-10-CM | POA: Diagnosis not present

## 2017-06-11 MED ORDER — KETOROLAC TROMETHAMINE 15 MG/ML IJ SOLN
15.0000 mg | Freq: Once | INTRAMUSCULAR | Status: AC
  Administered 2017-06-11: 15 mg via INTRAVENOUS
  Filled 2017-06-11: qty 1

## 2017-06-11 MED ORDER — DIPHENHYDRAMINE HCL 50 MG/ML IJ SOLN
25.0000 mg | Freq: Once | INTRAMUSCULAR | Status: AC
  Administered 2017-06-11: 25 mg via INTRAVENOUS
  Filled 2017-06-11: qty 1

## 2017-06-11 MED ORDER — SODIUM CHLORIDE 0.9 % IV BOLUS (SEPSIS)
1000.0000 mL | Freq: Once | INTRAVENOUS | Status: AC
  Administered 2017-06-11: 1000 mL via INTRAVENOUS

## 2017-06-11 MED ORDER — METOCLOPRAMIDE HCL 5 MG/ML IJ SOLN
10.0000 mg | Freq: Once | INTRAMUSCULAR | Status: AC
  Administered 2017-06-11: 10 mg via INTRAVENOUS
  Filled 2017-06-11: qty 2

## 2017-06-11 NOTE — ED Provider Notes (Signed)
[facility] DEPT [facility] Provider Note: [name], MD, FACEP  CSN: [id number] MRN: [id number] ARRIVAL: 06/11/17 at [place]: [place]  Headache   HISTORY OF PRESENT ILLNESS  Tiffany Christensen is a 36 y.o. female with a history of migraines. She is here with a migraine headache that has been present for about the past 4 days. She had several hours of respite 2 days ago but the headache returned. She has been taking Tylenol without adequate relief. The pain is located in her temples bilaterally. It is characterized as like previous migraines. She rates her pain as 5 out of 10. There is associated nausea but no vomiting. She has photophobia and phonophobia. She has also been sweating.   Past Medical History:  Diagnosis Date  . Gastric ulcer   . Migraine   . Ovarian cyst   . Uterine fibroid     Past Surgical History:  Procedure Laterality Date  . ABDOMINAL HYSTERECTOMY    . BREAST SURGERY    . ESOPHAGOGASTRODUODENOSCOPY N/A 12/07/2016   Procedure: ESOPHAGOGASTRODUODENOSCOPY (EGD);  Surgeon: Doran Stabler, MD;  Location: [facility] ENDOSCOPY;  Service: Endoscopy;  Laterality: N/A;  . hemmorhoidectomy    . HEMORRHOID SURGERY    . HERNIA REPAIR      Family History  Problem Relation Age of Onset  . Hypertension Maternal Grandmother     Social History  Substance Use Topics  . Smoking status: Current Every Day Smoker    Packs/day: 0.50    Types: Cigarettes  . Smokeless tobacco: Never Used  . Alcohol use No    Prior to Admission medications   Medication Sig Start Date End Date Taking? Authorizing Provider  pantoprazole (PROTONIX) 40 MG tablet Take 1 tablet (40 mg total) by mouth 2 (two) times daily. 12/08/16   Debbe Odea, MD    Allergies Codeine   REVIEW OF SYSTEMS  Negative except as noted here or in the History of Present Illness.   PHYSICAL EXAMINATION  Initial Vital Signs Blood pressure 123/82, pulse 91, temperature 98.5 F (36.9 C),  temperature source Oral, resp. rate 16, height 5\' 5"  (1.651 m), weight 59 kg (130 lb), SpO2 100 %.  Examination General: Well-developed, well-nourished female in no acute distress; appearance consistent with age of record HENT: normocephalic; atraumatic Eyes: pupils equal, round and reactive to light; extraocular muscles intact; photophobia Neck: supple Heart: regular rate and rhythm Lungs: clear to auscultation bilaterally Abdomen: soft; nondistended; nontender; no masses or hepatosplenomegaly; bowel sounds present Extremities: No deformity; full range of motion; pulses normal Neurologic: Awake, alert and oriented; motor function intact in all extremities and symmetric; no facial droop Skin: Warm and dry Psychiatric: Normal mood and affect   RESULTS  Summary of this visit's results, reviewed by myself:   EKG Interpretation  Date/Time:    Ventricular Rate:    PR Interval:    QRS Duration:   QT Interval:    QTC Calculation:   R Axis:     Text Interpretation:        Laboratory Studies: No results found for this or any previous visit (from the past 24 hour(s)). Imaging Studies: No results found.  ED COURSE  Nursing notes and initial vitals signs, including pulse oximetry, reviewed.  Vitals:   06/11/17 0025 06/11/17 0026 06/11/17 0318  BP:  123/82 122/86  Pulse:  91 87  Resp:  16 16  Temp:  98.5 F (36.9 C)   TempSrc:  Oral   SpO2:  100% 100%  Weight: 59 kg (130 lb)    Height: 5\' 5"  (1.651 m)     3:35 AM Patient significantly improved after IV fluids and medications. States she is ready to go home.  PROCEDURES    ED DIAGNOSES     ICD-10-CM   1. Sporadic migraine G43.[place]        Jasey Cortez, MD 06/11/17 [phone]

## 2017-06-11 NOTE — ED Notes (Signed)
ED Provider at bedside. 

## 2017-06-11 NOTE — ED Notes (Signed)
C/op ha off and on x 4 days  Denies n/v,  otc meds not helping

## 2017-06-11 NOTE — ED Triage Notes (Signed)
Pt reports onset of headache that lasted two days, she felt better yesterday and it returned today. She denies n/v or dizziness but has a sensitivity to light and reports sweats.  She last took tylenol around 7pm for pain without relief.

## 2017-07-01 ENCOUNTER — Encounter (HOSPITAL_BASED_OUTPATIENT_CLINIC_OR_DEPARTMENT_OTHER): Payer: Self-pay | Admitting: *Deleted

## 2017-07-01 DIAGNOSIS — F17210 Nicotine dependence, cigarettes, uncomplicated: Secondary | ICD-10-CM | POA: Insufficient documentation

## 2017-07-01 DIAGNOSIS — Z79899 Other long term (current) drug therapy: Secondary | ICD-10-CM | POA: Diagnosis not present

## 2017-07-01 DIAGNOSIS — N644 Mastodynia: Secondary | ICD-10-CM | POA: Insufficient documentation

## 2017-07-01 DIAGNOSIS — L299 Pruritus, unspecified: Secondary | ICD-10-CM | POA: Insufficient documentation

## 2017-07-01 NOTE — ED Triage Notes (Signed)
Pt with 4 days of left breast itching and pain pt also reports swelling

## 2017-07-02 ENCOUNTER — Emergency Department (HOSPITAL_BASED_OUTPATIENT_CLINIC_OR_DEPARTMENT_OTHER)
Admission: EM | Admit: 2017-07-02 | Discharge: 2017-07-02 | Disposition: A | Discharge status: Home / Self Care | Payer: Medicaid Other | Attending: Emergency Medicine | Admitting: Emergency Medicine

## 2017-07-02 ENCOUNTER — Encounter (HOSPITAL_BASED_OUTPATIENT_CLINIC_OR_DEPARTMENT_OTHER): Payer: Self-pay | Admitting: Emergency Medicine

## 2017-07-02 DIAGNOSIS — N644 Mastodynia: Secondary | ICD-10-CM

## 2017-07-02 HISTORY — DX: Barrett's esophagus without dysplasia: K22.70

## 2017-07-02 MED ORDER — KETOROLAC TROMETHAMINE 30 MG/ML IJ SOLN
15.0000 mg | Freq: Once | INTRAMUSCULAR | Status: AC
  Administered 2017-07-02: 15 mg via INTRAMUSCULAR
  Filled 2017-07-02: qty 1

## 2017-07-02 MED ORDER — HYDROCODONE-ACETAMINOPHEN 5-325 MG PO TABS
1.0000 | ORAL_TABLET | Freq: Once | ORAL | Status: AC
  Administered 2017-07-02: 1 via ORAL
  Filled 2017-07-02: qty 1

## 2017-07-02 MED ORDER — NAPROXEN 500 MG PO TABS: 500.0000 mg | ORAL_TABLET | Freq: Two times a day (BID) | ORAL | 0 refills | Status: DC

## 2017-07-02 NOTE — ED Provider Notes (Signed)
[facility] DEPT Provider Note   CSN: [id number] Arrival date & time: 07/01/17  2335  By signing my name below, I, Tiffany Christensen, attest that this documentation has been prepared under the direction and in the presence of physician practitioner, Heaton Sarin, Barbette Hair, MD. Electronically Signed: Dora Christensen, Scribe. 07/02/2017. 12:32 AM.  History   Chief Complaint Chief Complaint  Patient presents with  . Breast Pain   The history is provided by the patient. No language interpreter was used.    HPI Comments: Tiffany Christensen is a 36 y.o. female who presents to the Emergency Department complaining of persistent soreness and itching to the bilateral breasts for five days. She notes that her left breast is acutely swollen. Patient rates the pain at 4/10 in severity. She has been taking Aleve which sometimes provides relief. Patient has had 5 breast lumpectomies involving both breasts and her subsequent biopsies have all been benign. She notes that she felt lumps around her right breast last week but has not palpated any since that time. Patient is followed by [facility] and visits them every 6 months due to her history of breast lumps. She denies the possibility of pregnancy as she has had a hysterectomy. She denies nipple discharge, fevers, chills, nausea, vomiting, or any other associated symptoms.  Past Medical History:  Diagnosis Date  . Barrett's esophagus   . Gastric ulcer   . Migraine   . Ovarian cyst   . Uterine fibroid     Patient Active Problem List   Diagnosis Date Noted  . 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 Menorrhagia 12/08/2016  . Anemia due to chronic blood loss 12/08/2016  . 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 Left sided abdominal pain   . Iron deficiency anemia due to chronic blood loss 12/05/2016    Past Surgical History:  Procedure Laterality Date  . ABDOMINAL HYSTERECTOMY    . BREAST LUMPECTOMY    . BREAST SURGERY    .  ESOPHAGOGASTRODUODENOSCOPY N/A 12/07/2016   Procedure: ESOPHAGOGASTRODUODENOSCOPY (EGD);  Surgeon: Doran Stabler, MD;  Location: [facility] ENDOSCOPY;  Service: Endoscopy;  Laterality: N/A;  . hemmorhoidectomy    . HEMORRHOID SURGERY    . HERNIA REPAIR      OB History    No data available       Home Medications    Prior to Admission medications   Medication Sig Start Date End Date Taking? Authorizing Provider  naproxen (NAPROSYN) 500 MG tablet Take 1 tablet (500 mg total) by mouth 2 (two) times daily. 07/02/17   Sonam Wandel, Barbette Hair, MD  pantoprazole (PROTONIX) 40 MG tablet Take 1 tablet (40 mg total) by mouth 2 (two) times daily. 12/08/16   Debbe Odea, MD    Family History Family History  Problem Relation Age of Onset  . Hypertension Maternal Grandmother     Social History Social History  Substance Use Topics  . Smoking status: Current Every Day Smoker    Packs/day: 0.50    Types: Cigarettes  . Smokeless tobacco: Never Used  . Alcohol use No     Allergies   Codeine   Review of Systems Review of Systems  Constitutional: Negative for chills and fever.  Gastrointestinal: Negative for nausea and vomiting.  Musculoskeletal: Positive for myalgias (bilateral breasts).  All other systems reviewed and are negative.  Physical Exam Updated Vital Signs BP 129/89 (BP Location: Left Arm)   Pulse 77   Temp 98.3 F (36.8 C) (Oral)  Resp 18   Ht 5\' 5"  (1.651 m)   Wt 63.5 kg (140 lb)   LMP 11/08/2016 (Exact Date) Comment: neg upreg  SpO2 100%   BMI 23.30 kg/m   Physical Exam  Constitutional: She is oriented to person, place, and time. She appears well-developed and well-nourished. No distress.  HENT:  Head: Normocephalic and atraumatic.  Cardiovascular: Normal rate, regular rhythm and normal heart sounds.   Pulmonary/Chest: Effort normal. No respiratory distress. She has no wheezes. Right breast exhibits no inverted nipple, no mass and no nipple discharge. Left breast  exhibits tenderness. Left breast exhibits no inverted nipple, no mass and no nipple discharge. There is no breast swelling.  Multiple scars noted over the breasts, no obvious masses or lumps, no overlying skin changes  Neurological: She is alert and oriented to person, place, and time.  Skin: Skin is warm and dry.  Psychiatric: She has a normal mood and affect.  Nursing note and vitals reviewed.  ED Treatments / Results  Labs (all labs ordered are listed, but only abnormal results are displayed) Labs Reviewed - No data to display  EKG  EKG Interpretation None       Radiology No results found.  Procedures Procedures (including critical care time)  DIAGNOSTIC STUDIES: Oxygen Saturation is 100% on RA, normal by my interpretation.    COORDINATION OF CARE: 12:32 AM Discussed treatment plan with pt at bedside and pt agreed to plan.  Medications Ordered in ED Medications  ketorolac (TORADOL) 30 MG/ML injection 15 mg (15 mg Intramuscular Given 07/02/17 0041)  HYDROcodone-acetaminophen (NORCO/VICODIN) 5-325 MG per tablet 1 tablet (1 tablet Oral Given 07/02/17 0039)     Initial Impression / Assessment and Plan / ED Course  I have reviewed the triage vital signs and the nursing notes.  Pertinent labs & imaging results that were available during my care of the patient were reviewed by me and considered in my medical decision making (see chart for details).    Patient presents with breast pain. History of multiple lumpectomies. She's not taken anything for the pain. Exam is fairly benign. No nipple discharge, mass, overlying skin changes.  Patient given naproxen.  She does have a primary physician in the surgeon who she follows up with regularly. Recommend close follow-up for potential imaging.  After history, exam, and medical workup I feel the patient has been appropriately medically screened and is safe for discharge home. Pertinent diagnoses were discussed with the patient. Patient  was given return precautions.  Final Clinical Impressions(s) / ED Diagnoses   Final diagnoses:  Breast pain, left    New Prescriptions Discharge Medication List as of 07/02/2017 12:34 AM    START taking these medications   Details  naproxen (NAPROSYN) 500 MG tablet Take 1 tablet (500 mg total) by mouth 2 (two) times daily., Starting Tue 07/02/2017, Print       I personally performed the services described in this documentation, which was scribed in my presence. The recorded information has been reviewed and is accurate.    Merryl Hacker, MD 07/05/17 0010

## 2017-07-02 NOTE — Discharge Instructions (Signed)
You were seen today for breast pain. At this time there is no signs of infection or abscess. Given your history, you need to follow-up closely with your breast surgeon for recheck and possible further imaging and evaluation. If you develop nipple discharge, skin redness, hard lumps, you should be reevaluated.

## 2017-08-24 ENCOUNTER — Emergency Department (HOSPITAL_BASED_OUTPATIENT_CLINIC_OR_DEPARTMENT_OTHER)
Admission: EM | Admit: 2017-08-24 | Discharge: 2017-08-24 | Disposition: A | Discharge status: Home / Self Care | Payer: Medicaid Other | Attending: Emergency Medicine | Admitting: Emergency Medicine

## 2017-08-24 ENCOUNTER — Encounter (HOSPITAL_BASED_OUTPATIENT_CLINIC_OR_DEPARTMENT_OTHER): Payer: Self-pay

## 2017-08-24 DIAGNOSIS — Z79899 Other long term (current) drug therapy: Secondary | ICD-10-CM | POA: Insufficient documentation

## 2017-08-24 DIAGNOSIS — F17210 Nicotine dependence, cigarettes, uncomplicated: Secondary | ICD-10-CM | POA: Insufficient documentation

## 2017-08-24 DIAGNOSIS — M79641 Pain in right hand: Secondary | ICD-10-CM | POA: Diagnosis present

## 2017-08-24 MED ORDER — OXYCODONE HCL 5 MG PO TABS: 2.5000 mg | ORAL_TABLET | ORAL | 0 refills | Status: DC | PRN

## 2017-08-24 MED ORDER — OXYCODONE-ACETAMINOPHEN 5-325 MG PO TABS
2.0000 | ORAL_TABLET | Freq: Once | ORAL | Status: AC
  Administered 2017-08-24: 2 via ORAL
  Filled 2017-08-24: qty 2

## 2017-08-24 MED ORDER — MELOXICAM 15 MG PO TABS: 15.0000 mg | ORAL_TABLET | Freq: Every day | ORAL | 0 refills | Status: DC

## 2017-08-24 NOTE — ED Notes (Signed)
PA at bedside.

## 2017-08-24 NOTE — Discharge Instructions (Signed)
Patient to elevate and ice her hand for 20 minutes at a time at least 3-4 times daily. You may take 1000 mg of Tylenol every 8 hours for pain and you may use 2.5 to 5 mg of oxycodone for severe pain. Follow-up as directed with Dr. Caralyn Guile. Do not take your hydrocodone wire using the oxycodone. Do not take any other over-the-counter pain medications with the medications I am advising her to take.

## 2017-08-24 NOTE — ED Provider Notes (Signed)
[facility] DEPT MHP Provider Note   CSN: [id number] Arrival date & time: 08/24/17  1013     History   Chief Complaint Chief Complaint  Patient presents with  . Hand Pain    HPI Tiffany Christensen is a 36 y.o. female.With past medical history of recent carpal tunnel release surgery to the right wrist. This was done 4 days ago by Dr. Caralyn Guile. Patient states that she began swelling today after surgery and has had progressively worsening swelling, throbbing pain in the hand. She denies fevers or chills. She has a new numbness or tingling in her fingers. She has been taking Vicodin with minimal relief in her pain.  HPI  Past Medical History:  Diagnosis Date  . Barrett's esophagus   . Gastric ulcer   . Migraine   . Ovarian cyst   . Uterine fibroid     Patient Active Problem List   Diagnosis Date Noted  . 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 Menorrhagia 12/08/2016  . Anemia due to chronic blood loss 12/08/2016  . 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 Left sided abdominal pain   . Iron deficiency anemia due to chronic blood loss 12/05/2016    Past Surgical History:  Procedure Laterality Date  . ABDOMINAL HYSTERECTOMY    . BREAST LUMPECTOMY    . BREAST SURGERY    . ESOPHAGOGASTRODUODENOSCOPY N/A 12/07/2016   Procedure: ESOPHAGOGASTRODUODENOSCOPY (EGD);  Surgeon: Doran Stabler, MD;  Location: [facility] ENDOSCOPY;  Service: Endoscopy;  Laterality: N/A;  . HAND SURGERY Right   . hemmorhoidectomy    . HEMORRHOID SURGERY    . HERNIA REPAIR      OB History    No data available       Home Medications    Prior to Admission medications   Medication Sig Start Date End Date Taking? Authorizing Provider  amitriptyline (ELAVIL) 50 MG tablet Take 50 mg by mouth at bedtime.   Yes [provider]  HYDROcodone-acetaminophen (NORCO/VICODIN) 5-325 MG tablet Take 1 tablet by mouth every 6 (six) hours as needed for moderate pain.   Yes  [provider]  naproxen (NAPROSYN) 500 MG tablet Take 1 tablet (500 mg total) by mouth 2 (two) times daily. 07/02/17   Horton, Barbette Hair, MD  pantoprazole (PROTONIX) 40 MG tablet Take 1 tablet (40 mg total) by mouth 2 (two) times daily. 12/08/16   Debbe Odea, MD    Family History Family History  Problem Relation Age of Onset  . Hypertension Maternal Grandmother     Social History Social History  Substance Use Topics  . Smoking status: Current Every Day Smoker    Packs/day: 0.50    Types: Cigarettes  . Smokeless tobacco: Never Used  . Alcohol use No     Allergies   Codeine   Review of Systems Review of Systems Ten systems reviewed and are negative for acute change, except as noted in the HPI.    Physical Exam Updated Vital Signs BP 125/87 (BP Location: Left Arm)   Pulse 86   Temp 98.7 F (37.1 C) (Oral)   Resp 20   Ht 5\' 5"  (1.651 m)   Wt 59 kg (130 lb)   LMP 11/08/2016 (Exact Date) Comment: neg upreg  SpO2 100%   BMI 21.63 kg/m   Physical Exam  Physical Exam  Nursing note and vitals reviewed. Constitutional: She is oriented to person, place, and time. She appears well-developed and well-nourished. No distress.  HENT:  Head: Normocephalic and atraumatic.  Eyes: Conjunctivae normal and EOM are normal. Pupils are equal, round, and reactive to light. No scleral icterus.  Neck: Normal range of motion.  Cardiovascular: Normal rate, regular rhythm and normal heart sounds.  Exam reveals no gallop and no friction rub.   No murmur heard. Pulmonary/Chest: Effort normal and breath sounds normal. No respiratory distress.  Abdominal: Soft. Bowel sounds are normal. She exhibits no distension and no mass. There is no tenderness. There is no guarding.  Musculoskeletal: Right hand with swelling, calf refill less than 2 seconds. Well healing midline surgical scar without signs of infection,VI  Neurological: She is alert and oriented to person, place, and time.    Skin: Skin is warm and dry. She is not diaphoretic.          ED Treatments / Results  Labs (all labs ordered are listed, but only abnormal results are displayed) Labs Reviewed - No data to display  EKG  EKG Interpretation None       Radiology No results found.  Procedures Procedures (including critical care time)  Medications Ordered in ED Medications  oxyCODONE-acetaminophen (PERCOCET/ROXICET) 5-325 MG per tablet 2 tablet (2 tablets Oral Given 08/24/17 1123)     Initial Impression / Assessment and Plan / ED Course  I have reviewed the triage vital signs and the nursing notes.  Pertinent labs & imaging results that were available during my care of the patient were reviewed by me and considered in my medical decision making (see chart for details).     Patient with post surgical pain and swelling. No dressing was removed. Patient given a Percocet while she was here which improved her pain significantly. I have discussed the case with Dr. Caralyn Guile and patient will be redressed, placed in sling, she'll be given a couple days of oxycodone in the follow-up with Dr. Caralyn Guile as scheduled. She appears safe for discharge at this time. Discussed return precautions with the patient.  Final Clinical Impressions(s) / ED Diagnoses   Final diagnoses:  None    New Prescriptions New Prescriptions   No medications on file     Margarita Mail, PA-C 08/24/17 [place], Grayce Sessions, MD 08/27/17 0040

## 2017-08-24 NOTE — ED Triage Notes (Signed)
Patient had carpal tunnel surgery 08/21/17 on right hand. Patient has been taking hydrocodone for pain. Patient states taking hydrocodone "two at a time to keep the pain down." Swelling began 08/22/17 evening despite elevating arm and icing it as instructed at discharge. Patient states right hand pain as sharp, stabbing and tingling as 6/10. Patient states numbness in right fingertips.

## 2017-10-14 ENCOUNTER — Emergency Department (HOSPITAL_BASED_OUTPATIENT_CLINIC_OR_DEPARTMENT_OTHER)
Admission: EM | Admit: 2017-10-14 | Discharge: 2017-10-14 | Disposition: A | Discharge status: Home / Self Care | Payer: Medicaid Other | Attending: Emergency Medicine | Admitting: Emergency Medicine

## 2017-10-14 ENCOUNTER — Encounter (HOSPITAL_BASED_OUTPATIENT_CLINIC_OR_DEPARTMENT_OTHER): Payer: Self-pay | Admitting: Emergency Medicine

## 2017-10-14 ENCOUNTER — Other Ambulatory Visit: Payer: Self-pay

## 2017-10-14 DIAGNOSIS — R51 Headache: Secondary | ICD-10-CM | POA: Insufficient documentation

## 2017-10-14 DIAGNOSIS — Y998 Other external cause status: Secondary | ICD-10-CM | POA: Insufficient documentation

## 2017-10-14 DIAGNOSIS — F17210 Nicotine dependence, cigarettes, uncomplicated: Secondary | ICD-10-CM | POA: Insufficient documentation

## 2017-10-14 DIAGNOSIS — Y92410 Unspecified street and highway as the place of occurrence of the external cause: Secondary | ICD-10-CM | POA: Diagnosis not present

## 2017-10-14 DIAGNOSIS — Z79899 Other long term (current) drug therapy: Secondary | ICD-10-CM | POA: Diagnosis not present

## 2017-10-14 DIAGNOSIS — R519 Headache, unspecified: Secondary | ICD-10-CM

## 2017-10-14 DIAGNOSIS — Y9389 Activity, other specified: Secondary | ICD-10-CM | POA: Insufficient documentation

## 2017-10-14 MED ORDER — KETOROLAC TROMETHAMINE 30 MG/ML IJ SOLN
30.0000 mg | Freq: Once | INTRAMUSCULAR | Status: AC
  Administered 2017-10-14: 30 mg via INTRAVENOUS
  Filled 2017-10-14: qty 1

## 2017-10-14 MED ORDER — METOCLOPRAMIDE HCL 5 MG/ML IJ SOLN
10.0000 mg | Freq: Once | INTRAMUSCULAR | Status: AC
  Administered 2017-10-14: 10 mg via INTRAVENOUS
  Filled 2017-10-14: qty 2

## 2017-10-14 MED ORDER — SODIUM CHLORIDE 0.9 % IV BOLUS (SEPSIS)
1000.0000 mL | Freq: Once | INTRAVENOUS | Status: AC
  Administered 2017-10-14: 1000 mL via INTRAVENOUS

## 2017-10-14 MED ORDER — DIPHENHYDRAMINE HCL 50 MG/ML IJ SOLN
25.0000 mg | Freq: Once | INTRAMUSCULAR | Status: AC
  Administered 2017-10-14: 25 mg via INTRAVENOUS
  Filled 2017-10-14: qty 1
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
## 2017-12-16 ENCOUNTER — Encounter (HOSPITAL_BASED_OUTPATIENT_CLINIC_OR_DEPARTMENT_OTHER): Payer: Self-pay | Admitting: Emergency Medicine

## 2017-12-16 ENCOUNTER — Emergency Department (HOSPITAL_BASED_OUTPATIENT_CLINIC_OR_DEPARTMENT_OTHER)
Admission: EM | Admit: 2017-12-16 | Discharge: 2017-12-16 | Disposition: A | Discharge status: Home / Self Care | Payer: Medicaid Other | Attending: Emergency Medicine | Admitting: Emergency Medicine

## 2017-12-16 ENCOUNTER — Emergency Department (HOSPITAL_BASED_OUTPATIENT_CLINIC_OR_DEPARTMENT_OTHER): Payer: Medicaid Other

## 2017-12-16 ENCOUNTER — Other Ambulatory Visit: Payer: Self-pay

## 2017-12-16 DIAGNOSIS — R1010 Upper abdominal pain, unspecified: Secondary | ICD-10-CM | POA: Diagnosis not present

## 2017-12-16 DIAGNOSIS — R1012 Left upper quadrant pain: Secondary | ICD-10-CM | POA: Diagnosis not present

## 2017-12-16 DIAGNOSIS — R531 Weakness: Secondary | ICD-10-CM | POA: Diagnosis not present

## 2017-12-16 DIAGNOSIS — R202 Paresthesia of skin: Secondary | ICD-10-CM | POA: Insufficient documentation

## 2017-12-16 DIAGNOSIS — N898 Other specified noninflammatory disorders of vagina: Secondary | ICD-10-CM | POA: Diagnosis not present

## 2017-12-16 DIAGNOSIS — R079 Chest pain, unspecified: Secondary | ICD-10-CM

## 2017-12-16 DIAGNOSIS — F17210 Nicotine dependence, cigarettes, uncomplicated: Secondary | ICD-10-CM | POA: Diagnosis not present

## 2017-12-16 DIAGNOSIS — R52 Pain, unspecified: Secondary | ICD-10-CM

## 2017-12-16 LAB — COMPREHENSIVE METABOLIC PANEL
ALT: 8 U/L — ABNORMAL LOW (ref 14–54)
AST: 10 U/L — ABNORMAL LOW (ref 15–41)
Albumin: 3.7 g/dL (ref 3.5–5.0)
Alkaline Phosphatase: 94 U/L (ref 38–126)
Anion gap: 8 (ref 5–15)
BUN: 10 mg/dL (ref 6–20)
CO2: 21 mmol/L — ABNORMAL LOW (ref 22–32)
Calcium: 8.9 mg/dL (ref 8.9–10.3)
Chloride: 109 mmol/L (ref 101–111)
Creatinine, Ser: 0.54 mg/dL (ref 0.44–1.00)
GFR calc Af Amer: >60 mL/min (ref >60)
GFR calc non Af Amer: >60 mL/min (ref >60)
Glucose, Bld: 91 mg/dL (ref 65–99)
Potassium: 3.7 mmol/L (ref 3.5–5.1)
Sodium: 138 mmol/L (ref 135–145)
Total Bilirubin: 0.5 mg/dL (ref 0.3–1.2)
Total Protein: 6.8 g/dL (ref 6.5–8.1)

## 2017-12-16 LAB — CBC WITH DIFFERENTIAL/PLATELET
Basophils Absolute: 0 10*3/uL (ref 0.0–0.1)
Basophils Relative: 0 %
Eosinophils Absolute: 0.1 10*3/uL (ref 0.0–0.7)
Eosinophils Relative: 1 %
HCT: 31.7 % — ABNORMAL LOW (ref 36.0–46.0)
Hemoglobin: 11.2 g/dL — ABNORMAL LOW (ref 12.0–15.0)
Lymphocytes Relative: 53 %
Lymphs Abs: 3.1 10*3/uL (ref 0.7–4.0)
MCH: 28.8 pg (ref 26.0–34.0)
MCHC: 35.3 g/dL (ref 30.0–36.0)
MCV: 81.5 fL (ref 78.0–100.0)
Monocytes Absolute: 0.6 10*3/uL (ref 0.1–1.0)
Monocytes Relative: 10 %
Neutro Abs: 2.1 10*3/uL (ref 1.7–7.7)
Neutrophils Relative %: 36 %
Platelets: 311 10*3/uL (ref 150–400)
RBC: 3.89 MIL/uL (ref 3.87–5.11)
RDW: 12.6 % (ref 11.5–15.5)
WBC: 5.8 10*3/uL (ref 4.0–10.5)

## 2017-12-16 LAB — TROPONIN I: Troponin I: <0.03 ng/mL (ref <0.03)

## 2017-12-16 LAB — LIPASE, BLOOD: Lipase: 95 U/L — ABNORMAL HIGH (ref 11–51)

## 2017-12-16 MED ORDER — OXYCODONE-ACETAMINOPHEN 5-325 MG PO TABS: 1.0000 | ORAL_TABLET | Freq: Four times a day (QID) | ORAL | 0 refills | Status: DC | PRN

## 2017-12-16 MED ORDER — KETOROLAC TROMETHAMINE 15 MG/ML IJ SOLN
15.0000 mg | Freq: Once | INTRAMUSCULAR | Status: AC
  Administered 2017-12-16: 15 mg via INTRAVENOUS
  Filled 2017-12-16: qty 1

## 2017-12-16 MED ORDER — METHOCARBAMOL 500 MG PO TABS: 500.0000 mg | ORAL_TABLET | Freq: Three times a day (TID) | ORAL | 0 refills | Status: DC | PRN

## 2017-12-16 MED FILL — METHOCARBAMOL 500 MG TABS: 500 | 10 days supply | Qty: 30 | Fill #0

## 2017-12-16 MED FILL — OXYCODONE-ACETAMINOPHEN 5-3: 5-325 | 2 days supply | Qty: 8 | Fill #0

## 2017-12-16 NOTE — Discharge Instructions (Signed)
You were seen in the emergency department today for pain in your chest pain and tingling in her left lower leg.  Your lab work did not show any electrolyte abnormalities or signs of infection.  Your hemoglobin which checks for anemia was consistent with your previous lab work.  Your x-rays of your chest, back, and left lower leg all were negative for fracture or dislocation.  There were no lesions consistent with a bone tumor.  Your EKG and the enzyme that we check to evaluate your heart both were normal appearing.  Your pancreatic enzyme was somewhat elevated, however it was not extremely high, this can be rechecked by your primary care provider.  I given you 2 prescriptions today for pain: - Robaxin-this is a muscle relaxer you may take this once every 8 hours as needed for pain.  You may not drive or operate heavy machinery while taking this medication. - Percocet-is a narcotic pain medication-he may take this once every 6 hours as needed for severe pain.  You may not drive or operate heavy machinery while taking this medicine  You may also supplement with Tylenol and apply heat or ice to the areas of discomfort, whichever feels better.  You are unsure as to what exactly is causing your chest discomfort and left lower extremity discomfort.  Will be important that you follow-up with your primary care provider in the next 1 week.  I would call today in order to schedule an appointment for later in the week.  You may return to the emergency department at any time for any new or worsening symptoms including but not limited to worsening pain, difficulty breathing, inability to move your leg, or numbness of the leg, or any other concerns.

## 2017-12-16 NOTE — ED Triage Notes (Addendum)
Pt having upper left abdominal pain with some diarrhea since yesterday.  Pt also c/o lower back pain radiating down her left leg.  Pt also noted that she is having vaginal discharge, clear.  No dysuria.
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
## 2018-01-22 ENCOUNTER — Encounter (HOSPITAL_BASED_OUTPATIENT_CLINIC_OR_DEPARTMENT_OTHER): Payer: Self-pay | Admitting: Emergency Medicine

## 2018-01-22 ENCOUNTER — Emergency Department (HOSPITAL_BASED_OUTPATIENT_CLINIC_OR_DEPARTMENT_OTHER)
Admission: EM | Admit: 2018-01-22 | Discharge: 2018-01-22 | Disposition: A | Discharge status: Home / Self Care | Payer: Medicaid Other | Attending: Emergency Medicine | Admitting: Emergency Medicine

## 2018-01-22 ENCOUNTER — Other Ambulatory Visit: Payer: Self-pay

## 2018-01-22 DIAGNOSIS — R112 Nausea with vomiting, unspecified: Secondary | ICD-10-CM

## 2018-01-22 DIAGNOSIS — R197 Diarrhea, unspecified: Secondary | ICD-10-CM | POA: Insufficient documentation

## 2018-01-22 DIAGNOSIS — F17210 Nicotine dependence, cigarettes, uncomplicated: Secondary | ICD-10-CM | POA: Diagnosis not present

## 2018-01-22 DIAGNOSIS — Z79899 Other long term (current) drug therapy: Secondary | ICD-10-CM | POA: Diagnosis not present

## 2018-01-22 DIAGNOSIS — L731 Pseudofolliculitis barbae: Secondary | ICD-10-CM

## 2018-01-22 DIAGNOSIS — R109 Unspecified abdominal pain: Secondary | ICD-10-CM | POA: Diagnosis present

## 2018-01-22 MED ORDER — SULFAMETHOXAZOLE-TRIMETHOPRIM 800-160 MG PO TABS: 1.0000 | ORAL_TABLET | Freq: Two times a day (BID) | ORAL | 0 refills | Status: AC

## 2018-01-22 MED ORDER — KETOROLAC TROMETHAMINE 30 MG/ML IJ SOLN
15.0000 mg | Freq: Once | INTRAMUSCULAR | Status: AC
  Administered 2018-01-22: 15 mg via INTRAVENOUS
  Filled 2018-01-22: qty 1

## 2018-01-22 MED ORDER — ONDANSETRON 8 MG PO TBDP
8.0000 mg | ORAL_TABLET | Freq: Once | ORAL | Status: DC
  Filled 2018-01-22: qty 1

## 2018-01-22 MED ORDER — DICYCLOMINE HCL 10 MG/ML IM SOLN
20.0000 mg | Freq: Once | INTRAMUSCULAR | Status: AC
  Administered 2018-01-22: 20 mg via INTRAMUSCULAR
  Filled 2018-01-22: qty 2

## 2018-01-22 MED ORDER — ONDANSETRON 8 MG PO TBDP: ORAL_TABLET | ORAL | 0 refills | Status: DC

## 2018-01-22 MED ORDER — ONDANSETRON HCL 4 MG/2ML IJ SOLN
4.0000 mg | Freq: Once | INTRAMUSCULAR | Status: AC
  Administered 2018-01-22: 4 mg via INTRAVENOUS
  Filled 2018-01-22: qty 2

## 2018-01-22 NOTE — ED Provider Notes (Signed)
[place] EMERGENCY DEPARTMENT Provider Note   CSN: [id number] Arrival date & time: 01/22/18  8676     History   Chief Complaint Chief Complaint  Patient presents with  . Abdominal Pain    HPI Tiffany Christensen is a 37 y.o. female.  The history is provided by the patient.  Diarrhea   This is a new problem. The current episode started 12 to 24 hours ago. The problem occurs 2 to 4 times per day. The problem has not changed since onset.The stool consistency is described as watery. There has been no fever. Associated symptoms include vomiting, headaches and myalgias. Pertinent negatives include no sweats. She has tried nothing for the symptoms. The treatment provided no relief. Risk factors include ill contacts. Her past medical history does not include irritable bowel syndrome.  Emesis   This is a new problem. The current episode started 12 to 24 hours ago. The problem occurs 2 to 4 times per day. The problem has not changed since onset.The emesis has an appearance of stomach contents. There has been no fever. Associated symptoms include diarrhea, headaches and myalgias. Pertinent negatives include no fever and no sweats. Risk factors include ill contacts.  Also has a lesion in her right groin she wants evaluated.    Past Medical History:  Diagnosis Date  . Barrett's esophagus   . Gastric ulcer   . Migraine   . Ovarian cyst   . Uterine fibroid     Patient Active Problem List   Diagnosis Date Noted  . 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 Menorrhagia 12/08/2016  . Anemia due to chronic blood loss 12/08/2016  . 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 Left sided abdominal pain   . Iron deficiency anemia due to chronic blood loss 12/05/2016    Past Surgical History:  Procedure Laterality Date  . ABDOMINAL HYSTERECTOMY    . BREAST LUMPECTOMY    . BREAST SURGERY    . CARPAL TUNNEL RELEASE    . ESOPHAGOGASTRODUODENOSCOPY N/A 12/07/2016     Procedure: ESOPHAGOGASTRODUODENOSCOPY (EGD);  Surgeon: Doran Stabler, MD;  Location: [facility] ENDOSCOPY;  Service: Endoscopy;  Laterality: N/A;  . HAND SURGERY Right   . hemmorhoidectomy    . HEMORRHOID SURGERY    . HERNIA REPAIR      OB History    No data available       Home Medications    Prior to Admission medications   Medication Sig Start Date End Date Taking? Authorizing Provider  amitriptyline (ELAVIL) 50 MG tablet Take 50 mg by mouth at bedtime.    [provider]  methocarbamol (ROBAXIN) 500 MG tablet Take 1 tablet (500 mg total) by mouth every 8 (eight) hours as needed for muscle spasms. 12/16/17   Petrucelli, Glynda Jaeger, PA-C  ondansetron (ZOFRAN ODT) 8 MG disintegrating tablet 8mg  ODT q8 hours prn nausea 01/22/18   Asante Blanda, MD  oxyCODONE-acetaminophen (PERCOCET/ROXICET) 5-325 MG tablet Take 1 tablet by mouth every 6 (six) hours as needed for severe pain. 12/16/17   Petrucelli, Samantha R, PA-C  sulfamethoxazole-trimethoprim (BACTRIM DS,SEPTRA DS) 800-160 MG tablet Take 1 tablet by mouth 2 (two) times daily for 7 days. 01/22/18 01/29/18  Levin Dagostino, MD    Family History Family History  Problem Relation Age of Onset  . Hypertension Maternal Grandmother     Social History Social History   Tobacco Use  . Smoking status: Current Every Day Smoker    Packs/day:  0.50    Types: Cigarettes  . Smokeless tobacco: Never Used  Substance Use Topics  . Alcohol use: No  . Drug use: No     Allergies   Codeine   Review of Systems Review of Systems  Constitutional: Negative for fever.  Eyes: Negative for photophobia.  Gastrointestinal: Positive for diarrhea and vomiting.  Genitourinary: Negative for dysuria.  Musculoskeletal: Positive for myalgias.  Skin: Negative for wound.  Neurological: Positive for headaches.  All other systems reviewed and are negative.    Physical Exam Updated Vital Signs BP 116/79 (BP Location: Right Arm)   Pulse 83    Temp 98.2 F (36.8 C) (Oral)   Resp 17   LMP 11/08/2016 (Exact Date) Comment: neg upreg  SpO2 99%   Physical Exam  Constitutional: She is oriented to person, place, and time. She appears well-developed and well-nourished. No distress.  HENT:  Head: Normocephalic and atraumatic.  Mouth/Throat: No oropharyngeal exudate.  Eyes: Conjunctivae are normal. Pupils are equal, round, and reactive to light.  Neck: Normal range of motion. Neck supple.  Cardiovascular: Normal rate, regular rhythm, normal heart sounds and intact distal pulses.  Pulmonary/Chest: Effort normal and breath sounds normal. No stridor. She has no wheezes. She has no rales.  Abdominal: Soft. Bowel sounds are normal. She exhibits no mass. There is no tenderness. There is no rebound and no guarding.  Genitourinary:  Genitourinary Comments: Chaperone present ingrown hair of the right lateral mons pubis, mild redness no fluctuance 5 mm  Musculoskeletal: Normal range of motion.  Neurological: She is alert and oriented to person, place, and time. She displays normal reflexes.  Skin: Skin is warm and dry. Capillary refill takes less than 2 seconds.  Psychiatric: She has a normal mood and affect.     ED Treatments / Results    Procedures Procedures (including critical care time)  Medications Ordered in ED Medications  dicyclomine (BENTYL) injection 20 mg (20 mg Intramuscular Given 01/22/18 0357)  ondansetron (ZOFRAN) injection 4 mg (4 mg Intravenous Given 01/22/18 0357)  ketorolac (TORADOL) 30 MG/ML injection 15 mg (15 mg Intravenous Given 01/22/18 0357)      Final Clinical Impressions(s) / ED Diagnoses   Final diagnoses:  Ingrown hair  Nausea vomiting and diarrhea   Return for weakness, numbness, changes in vision or speech,  fevers > 100.4 unrelieved by medication, shortness of breath, intractable vomiting, or diarrhea, abdominal pain, Inability to tolerate liquids or food, cough, altered mental status or any  concerns. No signs of systemic illness or infection. The patient is nontoxic-appearing on exam and vital signs are within normal limits.    I have reviewed the triage vital signs and the nursing notes. Pertinent labs &imaging results that were available during my care of the patient were reviewed by me and considered in my medical decision making (see chart for details).  After history, exam, and medical workup I feel the patient has been appropriately medically screened and is safe for discharge home. Pertinent diagnoses were discussed with the patient. Patient was given return precautions. ED Discharge Orders        Ordered    ondansetron (ZOFRAN ODT) 8 MG disintegrating tablet     01/22/18 0500    sulfamethoxazole-trimethoprim (BACTRIM DS,SEPTRA DS) 800-160 MG tablet  2 times daily     01/22/18 0500       Kostantinos Tallman, MD 01/22/18 7106

## 2018-01-22 NOTE — ED Triage Notes (Addendum)
Pt presents with c/o N/V/D and headache and abscess to right groin

## 2018-01-22 NOTE — ED Notes (Signed)
Pt discharged to home with friend. NAD.

## 2018-01-22 NOTE — ED Notes (Signed)
Pt states her nausea ia a little better. Water given to patient to attempt.

## 2018-01-29 ENCOUNTER — Encounter (HOSPITAL_BASED_OUTPATIENT_CLINIC_OR_DEPARTMENT_OTHER): Payer: Self-pay | Admitting: Emergency Medicine

## 2018-01-29 ENCOUNTER — Other Ambulatory Visit: Payer: Self-pay

## 2018-01-29 ENCOUNTER — Emergency Department (HOSPITAL_BASED_OUTPATIENT_CLINIC_OR_DEPARTMENT_OTHER): Payer: Medicaid Other

## 2018-01-29 ENCOUNTER — Emergency Department (HOSPITAL_BASED_OUTPATIENT_CLINIC_OR_DEPARTMENT_OTHER)
Admission: EM | Admit: 2018-01-29 | Discharge: 2018-01-29 | Disposition: A | Discharge status: Home / Self Care | Payer: Medicaid Other | Attending: Emergency Medicine | Admitting: Emergency Medicine

## 2018-01-29 DIAGNOSIS — J3489 Other specified disorders of nose and nasal sinuses: Secondary | ICD-10-CM | POA: Insufficient documentation

## 2018-01-29 DIAGNOSIS — K2900 Acute gastritis without bleeding: Secondary | ICD-10-CM | POA: Diagnosis not present

## 2018-01-29 DIAGNOSIS — R51 Headache: Secondary | ICD-10-CM | POA: Insufficient documentation

## 2018-01-29 DIAGNOSIS — F17210 Nicotine dependence, cigarettes, uncomplicated: Secondary | ICD-10-CM | POA: Diagnosis not present

## 2018-01-29 DIAGNOSIS — J029 Acute pharyngitis, unspecified: Secondary | ICD-10-CM | POA: Diagnosis not present

## 2018-01-29 DIAGNOSIS — R05 Cough: Secondary | ICD-10-CM | POA: Diagnosis not present

## 2018-01-29 DIAGNOSIS — R1013 Epigastric pain: Secondary | ICD-10-CM | POA: Diagnosis present

## 2018-01-29 LAB — COMPREHENSIVE METABOLIC PANEL
ALT: 11 U/L — ABNORMAL LOW (ref 14–54)
AST: 14 U/L — ABNORMAL LOW (ref 15–41)
Albumin: 3.9 g/dL (ref 3.5–5.0)
Alkaline Phosphatase: 92 U/L (ref 38–126)
Anion gap: 11 (ref 5–15)
BUN: 16 mg/dL (ref 6–20)
CO2: 20 mmol/L — ABNORMAL LOW (ref 22–32)
Calcium: 8.8 mg/dL — ABNORMAL LOW (ref 8.9–10.3)
Chloride: 107 mmol/L (ref 101–111)
Creatinine, Ser: 0.5 mg/dL (ref 0.44–1.00)
GFR calc Af Amer: >60 mL/min (ref >60)
GFR calc non Af Amer: >60 mL/min (ref >60)
Glucose, Bld: 90 mg/dL (ref 65–99)
Potassium: 3.4 mmol/L — ABNORMAL LOW (ref 3.5–5.1)
Sodium: 138 mmol/L (ref 135–145)
Total Bilirubin: 0.3 mg/dL (ref 0.3–1.2)
Total Protein: 7.1 g/dL (ref 6.5–8.1)

## 2018-01-29 LAB — CBC WITH DIFFERENTIAL/PLATELET
Basophils Absolute: 0 10*3/uL (ref 0.0–0.1)
Basophils Relative: 0 %
Eosinophils Absolute: 0.1 10*3/uL (ref 0.0–0.7)
Eosinophils Relative: 1 %
HCT: 33 % — ABNORMAL LOW (ref 36.0–46.0)
Hemoglobin: 11.6 g/dL — ABNORMAL LOW (ref 12.0–15.0)
Lymphocytes Relative: 45 %
Lymphs Abs: 3.7 10*3/uL (ref 0.7–4.0)
MCH: 28.6 pg (ref 26.0–34.0)
MCHC: 35.2 g/dL (ref 30.0–36.0)
MCV: 81.5 fL (ref 78.0–100.0)
Monocytes Absolute: 0.6 10*3/uL (ref 0.1–1.0)
Monocytes Relative: 8 %
Neutro Abs: 3.7 10*3/uL (ref 1.7–7.7)
Neutrophils Relative %: 46 %
Platelets: 249 10*3/uL (ref 150–400)
RBC: 4.05 MIL/uL (ref 3.87–5.11)
RDW: 12.5 % (ref 11.5–15.5)
WBC: 8 10*3/uL (ref 4.0–10.5)

## 2018-01-29 LAB — URINALYSIS, ROUTINE W REFLEX MICROSCOPIC
Bilirubin Urine: NEGATIVE
Glucose, UA: NEGATIVE mg/dL
Ketones, ur: NEGATIVE mg/dL
Leukocytes, UA: NEGATIVE
Nitrite: NEGATIVE
Protein, ur: NEGATIVE mg/dL
Specific Gravity, Urine: 1.025 (ref 1.005–1.030)
pH: 6 (ref 5.0–8.0)

## 2018-01-29 LAB — URINALYSIS, MICROSCOPIC (REFLEX)

## 2018-01-29 LAB — PREGNANCY, URINE: Preg Test, Ur: NEGATIVE

## 2018-01-29 LAB — TROPONIN I: Troponin I: <0.03 ng/mL (ref <0.03)

## 2018-01-29 LAB — LIPASE, BLOOD: Lipase: 55 U/L — ABNORMAL HIGH (ref 11–51)

## 2018-01-29 MED ORDER — METOCLOPRAMIDE HCL 5 MG/ML IJ SOLN
10.0000 mg | Freq: Once | INTRAMUSCULAR | Status: AC
  Administered 2018-01-29: 10 mg via INTRAVENOUS
  Filled 2018-01-29: qty 2

## 2018-01-29 MED ORDER — DIPHENHYDRAMINE HCL 50 MG/ML IJ SOLN
25.0000 mg | Freq: Once | INTRAMUSCULAR | Status: AC
  Administered 2018-01-29: 25 mg via INTRAVENOUS
  Filled 2018-01-29: qty 1

## 2018-01-29 MED ORDER — SODIUM CHLORIDE 0.9 % IV BOLUS (SEPSIS)
1000.0000 mL | Freq: Once | INTRAVENOUS | Status: AC
  Administered 2018-01-29: 1000 mL via INTRAVENOUS

## 2018-01-29 MED ORDER — SUCRALFATE 1 G PO TABS: 1.0000 g | ORAL_TABLET | Freq: Three times a day (TID) | ORAL | 0 refills | Status: DC

## 2018-01-29 MED ORDER — GI COCKTAIL ~~LOC~~
30.0000 mL | Freq: Once | ORAL | Status: AC
Start: 2018-01-29 — End: 2018-01-29
  Administered 2018-01-29: 30 mL via ORAL
  Filled 2018-01-29: qty 30

## 2018-01-29 MED ORDER — ONDANSETRON 4 MG PO TBDP: 4.0000 mg | ORAL_TABLET | Freq: Three times a day (TID) | ORAL | 0 refills | Status: DC | PRN

## 2018-01-29 MED ORDER — OMEPRAZOLE 40 MG PO CPDR: 40.0000 mg | DELAYED_RELEASE_CAPSULE | Freq: Every day | ORAL | 0 refills | Status: DC

## 2018-01-29 MED ORDER — POTASSIUM CHLORIDE CRYS ER 20 MEQ PO TBCR
40.0000 meq | EXTENDED_RELEASE_TABLET | Freq: Once | ORAL | Status: AC
  Administered 2018-01-29: 40 meq via ORAL
  Filled 2018-01-29: qty 2

## 2018-01-29 MED FILL — SUCRALFATE 1 GM TABLET: 1 | 10 days supply | Qty: 40 | Fill #0

## 2018-01-29 MED FILL — OMEPRAZOLE DR 40 MG CAPSULE: 40 | 14 days supply | Qty: 14 | Fill #0

## 2018-01-29 MED FILL — ONDANSETRON ODT 4 MG TABLET: 4 | 6 days supply | Qty: 20 | Fill #0

## 2018-01-29 NOTE — ED Provider Notes (Signed)
[place] EMERGENCY DEPARTMENT Provider Note   CSN: [id number] Arrival date & time: 01/29/18  2409     History   Chief Complaint Chief Complaint  Patient presents with  . Chest Pain    HPI Tiffany Christensen is a 37 y.o. female.  HPI   37 year old female with past medical history as below including history of gastric ulcer here with epigastric pain.  The patient states that she had a cold last week.  She has been on Bactrim for a skin infection.  She has had cough, nasal congestion, and sore throat.  She is also had intermittent acute on chronic headaches and has been taking Excedrin.  Yesterday, after eating [employer], the patient developed a severe, burning, substernal and epigastric pain.  The pain is worse with lying flat.  It is worse with eating.  She also felt like she had difficulty getting food to pass the area of pain.  This correlates directly with eating.  She is had no associated worsening cough or shortness of breath.  Denies any recent workup by GI.  Denies any alleviating factors.  No diaphoresis.  No vomiting.  No diarrhea.  No right upper quadrant pain.  Past Medical History:  Diagnosis Date  . Barrett's esophagus   . Gastric ulcer   . Migraine   . Ovarian cyst   . Uterine fibroid     Patient Active Problem List   Diagnosis Date Noted  . 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 Menorrhagia 12/08/2016  . Anemia due to chronic blood loss 12/08/2016  . 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 Left sided abdominal pain   . Iron deficiency anemia due to chronic blood loss 12/05/2016    Past Surgical History:  Procedure Laterality Date  . ABDOMINAL HYSTERECTOMY    . BREAST LUMPECTOMY    . BREAST SURGERY    . CARPAL TUNNEL RELEASE    . ESOPHAGOGASTRODUODENOSCOPY N/A 12/07/2016   Procedure: ESOPHAGOGASTRODUODENOSCOPY (EGD);  Surgeon: Doran Stabler, MD;  Location: [facility] ENDOSCOPY;  Service: Endoscopy;  Laterality: N/A;  .  HAND SURGERY Right   . hemmorhoidectomy    . HEMORRHOID SURGERY    . HERNIA REPAIR      OB History    No data available       Home Medications    Prior to Admission medications   Medication Sig Start Date End Date Taking? Authorizing Provider  amitriptyline (ELAVIL) 50 MG tablet Take 50 mg by mouth at bedtime.    [provider]  methocarbamol (ROBAXIN) 500 MG tablet Take 1 tablet (500 mg total) by mouth every 8 (eight) hours as needed for muscle spasms. 12/16/17   Petrucelli, Samantha R, PA-C  omeprazole (PRILOSEC) 40 MG capsule Take 1 capsule (40 mg total) by mouth daily for 14 days. 01/29/18 02/12/18  Duffy Bruce, MD  ondansetron (ZOFRAN ODT) 4 MG disintegrating tablet Take 1 tablet (4 mg total) by mouth every 8 (eight) hours as needed for nausea or vomiting. 01/29/18   Duffy Bruce, MD  oxyCODONE-acetaminophen (PERCOCET/ROXICET) 5-325 MG tablet Take 1 tablet by mouth every 6 (six) hours as needed for severe pain. 12/16/17   Petrucelli, Samantha R, PA-C  sucralfate (CARAFATE) 1 g tablet Take 1 tablet (1 g total) by mouth 4 (four) times daily -  with meals and at bedtime. 01/29/18   Duffy Bruce, MD  sulfamethoxazole-trimethoprim (BACTRIM DS,SEPTRA DS) 800-160 MG tablet Take 1 tablet by mouth 2 (two) times daily  for 7 days. 01/22/18 01/29/18  Palumbo, April, MD    Family History Family History  Problem Relation Age of Onset  . Hypertension Maternal Grandmother     Social History Social History   Tobacco Use  . Smoking status: Current Every Day Smoker    Packs/day: 0.50    Types: Cigarettes  . Smokeless tobacco: Never Used  Substance Use Topics  . Alcohol use: No  . Drug use: No     Allergies   Codeine   Review of Systems Review of Systems  Constitutional: Positive for fatigue. Negative for chills and fever.  HENT: Negative for congestion and rhinorrhea.   Eyes: Negative for visual disturbance.  Respiratory: Negative for cough, shortness of breath and  wheezing.   Cardiovascular: Positive for chest pain. Negative for leg swelling.  Gastrointestinal: Positive for abdominal pain and nausea. Negative for diarrhea and vomiting.  Genitourinary: Negative for dysuria and flank pain.  Musculoskeletal: Negative for neck pain and neck stiffness.  Skin: Negative for rash and wound.  Allergic/Immunologic: Negative for immunocompromised state.  Neurological: Negative for syncope, weakness and headaches.  All other systems reviewed and are negative.    Physical Exam Updated Vital Signs BP 107/67 (BP Location: Left Arm)   Pulse 80   Temp 98.2 F (36.8 C) (Oral)   Resp 16   Ht 5\' 5"  (1.651 m)   Wt 60.3 kg (133 lb)   LMP 11/08/2016 (Exact Date) Comment: neg upreg  SpO2 98%   BMI 22.13 kg/m   Physical Exam  Constitutional: She is oriented to person, place, and time. She appears well-developed and well-nourished. No distress.  HENT:  Head: Normocephalic and atraumatic.  Eyes: Conjunctivae are normal.  Neck: Neck supple.  Cardiovascular: Normal rate, regular rhythm and normal heart sounds. Exam reveals no friction rub.  No murmur heard. Pulmonary/Chest: Effort normal and breath sounds normal. No respiratory distress. She has no wheezes. She has no rales.  Abdominal: Bowel sounds are normal. She exhibits no distension and no mass. There is tenderness (Mild, epigastric). There is no guarding.  Musculoskeletal: She exhibits no edema.  Neurological: She is alert and oriented to person, place, and time. She exhibits normal muscle tone.  Skin: Skin is warm. Capillary refill takes less than 2 seconds.  Psychiatric: She has a normal mood and affect.  Nursing note and vitals reviewed.    ED Treatments / Results  Labs (all labs ordered are listed, but only abnormal results are displayed) Labs Reviewed  CBC WITH DIFFERENTIAL/PLATELET - Abnormal; Notable for the following components:      Result Value   Hemoglobin 11.6 (*)    HCT 33.0 (*)     All other components within normal limits  COMPREHENSIVE METABOLIC PANEL - Abnormal; Notable for the following components:   Potassium 3.4 (*)    CO2 20 (*)    Calcium 8.8 (*)    AST 14 (*)    ALT 11 (*)    All other components within normal limits  LIPASE, BLOOD - Abnormal; Notable for the following components:   Lipase 55 (*)    All other components within normal limits  URINALYSIS, ROUTINE W REFLEX MICROSCOPIC - Abnormal; Notable for the following components:   Hgb urine dipstick LARGE (*)    All other components within normal limits  URINALYSIS, MICROSCOPIC (REFLEX) - Abnormal; Notable for the following components:   Bacteria, UA FEW (*)    Squamous Epithelial / LPF 0-5 (*)    All other components  within normal limits  PREGNANCY, URINE  TROPONIN I    EKG  EKG Interpretation  Date/Time:  Wednesday January 29 2018 08:53:07 EST Ventricular Rate:  89 PR Interval:    QRS Duration: 79 QT Interval:  357 QTC Calculation: 435 R Axis:   65 Text Interpretation:  Sinus rhythm Probable left atrial enlargement No significant change since last tracing Confirmed by Duffy Bruce [phone]) on 01/29/2018 8:55:05 AM Also confirmed by Duffy Bruce [phone]), editor Lynder Parents [phone])  on 01/29/2018 9:24:11 AM       Radiology Dg Chest 2 View  Result Date: 01/29/2018 CLINICAL DATA:  Mid substernal chest pain EXAM: CHEST  2 VIEW COMPARISON:  12/16/2017 FINDINGS: The heart size and mediastinal contours are within normal limits. Both lungs are clear. The visualized skeletal structures are unremarkable. IMPRESSION: No active cardiopulmonary disease. Electronically Signed   By: Kathreen Devoid   On: 01/29/2018 09:14    Procedures Procedures (including critical care time)  Medications Ordered in ED Medications  metoCLOPramide (REGLAN) injection 10 mg (10 mg Intravenous Given 01/29/18 1008)  diphenhydrAMINE (BENADRYL) injection 25 mg (25 mg Intravenous Given 01/29/18 1011)  sodium chloride  0.9 % bolus 1,000 mL (0 mLs Intravenous Stopped 01/29/18 1108)  gi cocktail (Maalox,Lidocaine,Donnatal) (30 mLs Oral Given 01/29/18 1007)  potassium chloride SA (K-DUR,KLOR-CON) CR tablet 40 mEq (40 mEq Oral Given 01/29/18 1109)     Initial Impression / Assessment and Plan / ED Course  I have reviewed the triage vital signs and the nursing notes.  Pertinent labs & imaging results that were available during my care of the patient were reviewed by me and considered in my medical decision making (see chart for details).     38 year old female here with epigastric, burning abdominal pain.  Exam and history is highly consistent with GERD with gastritis, with possible component of esophageal stricture.  She does have a history of gastric ulcer and takes regular NSAIDs.  No signs of active bleeding.  Her hemoglobin is stable.  CMP without signs of elevated LFTs or bilirubin and she has no right upper quadrant tenderness or signs of cholecystitis.  Lipase mildly elevated but this is chronic and she has no other symptoms to suggest severe pancreatitis.  She feels markedly improved after GI cocktail and symptomatic management.  Troponin negative, EKG nonischemic, chest x-ray negative, I do not suspect cardiopulmonary process.  Given that she has had complete resolution with GI cocktail, will treat with antacids, refer to GI for EGD.  The importance of regular follow-up for possible scope and further evaluation discussed in detail.  Patient is eating and drinking without difficulty after symptomatic treatment and she shows no signs of severe stricture.  Final Clinical Impressions(s) / ED Diagnoses   Final diagnoses:  Acute superficial gastritis without hemorrhage    ED Discharge Orders        Ordered    omeprazole (PRILOSEC) 40 MG capsule  Daily     01/29/18 1058    sucralfate (CARAFATE) 1 g tablet  3 times daily with meals & bedtime     01/29/18 1058    ondansetron (ZOFRAN ODT) 4 MG disintegrating  tablet  Every 8 hours PRN     01/29/18 1058       Duffy Bruce, MD 01/29/18 1544

## 2018-01-29 NOTE — ED Triage Notes (Signed)
Patient states that she is having pain to her mid chest. Reports that is started last night between her breasts. The patient states that it feels like she has "congestion stuck there and it will not come up"

## 2018-01-29 NOTE — ED Notes (Addendum)
Tolerating po flds

## 2018-06-01 ENCOUNTER — Other Ambulatory Visit: Payer: Self-pay

## 2018-06-01 ENCOUNTER — Emergency Department (HOSPITAL_BASED_OUTPATIENT_CLINIC_OR_DEPARTMENT_OTHER)
Admission: EM | Admit: 2018-06-01 | Discharge: 2018-06-01 | Disposition: A | Discharge status: Home / Self Care | Payer: Medicaid Other | Attending: Emergency Medicine | Admitting: Emergency Medicine

## 2018-06-01 ENCOUNTER — Encounter (HOSPITAL_BASED_OUTPATIENT_CLINIC_OR_DEPARTMENT_OTHER): Payer: Self-pay | Admitting: Emergency Medicine

## 2018-06-01 DIAGNOSIS — N390 Urinary tract infection, site not specified: Secondary | ICD-10-CM | POA: Insufficient documentation

## 2018-06-01 DIAGNOSIS — F17210 Nicotine dependence, cigarettes, uncomplicated: Secondary | ICD-10-CM | POA: Diagnosis not present

## 2018-06-01 DIAGNOSIS — R319 Hematuria, unspecified: Secondary | ICD-10-CM

## 2018-06-01 DIAGNOSIS — R109 Unspecified abdominal pain: Secondary | ICD-10-CM | POA: Diagnosis present

## 2018-06-01 LAB — URINALYSIS, ROUTINE W REFLEX MICROSCOPIC
Bilirubin Urine: NEGATIVE
Glucose, UA: NEGATIVE mg/dL
Ketones, ur: NEGATIVE mg/dL
Nitrite: NEGATIVE
Protein, ur: NEGATIVE mg/dL
Specific Gravity, Urine: 1.015 (ref 1.005–1.030)
pH: 6.5 (ref 5.0–8.0)

## 2018-06-01 LAB — COMPREHENSIVE METABOLIC PANEL
ALT: 9 U/L — ABNORMAL LOW (ref 14–54)
AST: 14 U/L — ABNORMAL LOW (ref 15–41)
Albumin: 3.5 g/dL (ref 3.5–5.0)
Alkaline Phosphatase: 77 U/L (ref 38–126)
Anion gap: 5 (ref 5–15)
BUN: 13 mg/dL (ref 6–20)
CO2: 21 mmol/L — ABNORMAL LOW (ref 22–32)
Calcium: 8.4 mg/dL — ABNORMAL LOW (ref 8.9–10.3)
Chloride: 112 mmol/L — ABNORMAL HIGH (ref 101–111)
Creatinine, Ser: 0.66 mg/dL (ref 0.44–1.00)
GFR calc Af Amer: >60 mL/min (ref >60)
GFR calc non Af Amer: >60 mL/min (ref >60)
Glucose, Bld: 107 mg/dL — ABNORMAL HIGH (ref 65–99)
Potassium: 3.8 mmol/L (ref 3.5–5.1)
Sodium: 138 mmol/L (ref 135–145)
Total Bilirubin: 0.3 mg/dL (ref 0.3–1.2)
Total Protein: 6.6 g/dL (ref 6.5–8.1)

## 2018-06-01 LAB — CBC WITH DIFFERENTIAL/PLATELET
Basophils Absolute: 0 10*3/uL (ref 0.0–0.1)
Basophils Relative: 0 %
Eosinophils Absolute: 0.1 10*3/uL (ref 0.0–0.7)
Eosinophils Relative: 1 %
HCT: 31.7 % — ABNORMAL LOW (ref 36.0–46.0)
Hemoglobin: 11 g/dL — ABNORMAL LOW (ref 12.0–15.0)
Lymphocytes Relative: 38 %
Lymphs Abs: 3.1 10*3/uL (ref 0.7–4.0)
MCH: 29 pg (ref 26.0–34.0)
MCHC: 34.7 g/dL (ref 30.0–36.0)
MCV: 83.6 fL (ref 78.0–100.0)
Monocytes Absolute: 0.7 10*3/uL (ref 0.1–1.0)
Monocytes Relative: 8 %
Neutro Abs: 4.3 10*3/uL (ref 1.7–7.7)
Neutrophils Relative %: 53 %
Platelets: 259 10*3/uL (ref 150–400)
RBC: 3.79 MIL/uL — ABNORMAL LOW (ref 3.87–5.11)
RDW: 12.7 % (ref 11.5–15.5)
WBC: 8.2 10*3/uL (ref 4.0–10.5)

## 2018-06-01 LAB — URINALYSIS, MICROSCOPIC (REFLEX): RBC / HPF: >50 RBC/hpf (ref 0–5)

## 2018-06-01 LAB — LIPASE, BLOOD: Lipase: 32 U/L (ref 11–51)

## 2018-06-01 MED ORDER — METHYLPREDNISOLONE SODIUM SUCC 125 MG IJ SOLR
125.0000 mg | Freq: Once | INTRAMUSCULAR | Status: AC
  Administered 2018-06-01: 125 mg via INTRAVENOUS
  Filled 2018-06-01: qty 2

## 2018-06-01 MED ORDER — DICYCLOMINE HCL 10 MG PO CAPS
10.0000 mg | ORAL_CAPSULE | Freq: Once | ORAL | Status: AC
  Administered 2018-06-01: 10 mg via ORAL
  Filled 2018-06-01: qty 1

## 2018-06-01 MED ORDER — DIPHENHYDRAMINE HCL 50 MG/ML IJ SOLN
25.0000 mg | Freq: Once | INTRAMUSCULAR | Status: AC
  Administered 2018-06-01: 25 mg via INTRAVENOUS
  Filled 2018-06-01: qty 1

## 2018-06-01 MED ORDER — CEPHALEXIN 250 MG PO CAPS
500.0000 mg | ORAL_CAPSULE | Freq: Once | ORAL | Status: AC
  Administered 2018-06-01: 500 mg via ORAL
  Filled 2018-06-01: qty 2

## 2018-06-01 MED ORDER — GI COCKTAIL ~~LOC~~
30.0000 mL | Freq: Once | ORAL | Status: AC
  Administered 2018-06-01: 30 mL via ORAL
  Filled 2018-06-01: qty 30

## 2018-06-01 MED ORDER — METOCLOPRAMIDE HCL 5 MG/ML IJ SOLN
10.0000 mg | Freq: Once | INTRAMUSCULAR | Status: AC
  Administered 2018-06-01: 10 mg via INTRAVENOUS
  Filled 2018-06-01: qty 2

## 2018-06-01 MED ORDER — LACTATED RINGERS IV BOLUS
1000.0000 mL | Freq: Once | INTRAVENOUS | Status: AC
  Administered 2018-06-01: 1000 mL via INTRAVENOUS

## 2018-06-01 MED ORDER — CEPHALEXIN 500 MG PO CAPS: 500.0000 mg | ORAL_CAPSULE | Freq: Four times a day (QID) | ORAL | 0 refills | Status: DC

## 2018-06-01 NOTE — ED Provider Notes (Signed)
Emergency Department Provider Note   I have reviewed the triage vital signs and the nursing notes.   HISTORY  Chief Complaint Abdominal Pain   HPI Tiffany Christensen is a 37 y.o. female with medical problems documented below the presents to the emergency department today secondary to 2 main complaints.  Patient states that she has had intermittent headaches for the last couple weeks but today seems to be worse than normal.  She has history of migraines and this feels like a migraine.  She says her primary doctor is offered to give her shot prescription in the past but she is denied it.  She states that the headache is moderate in nature and a full sensation.  No neurologic symptoms associated with it. She also states that she had a week or so of abdominal bloating.  Normal bowel movements.  No nausea, vomiting, fever, urinary changes.  States she has had increased gas over this time frame.  States she seems like it is worse when she stands and says but better when she lies down.  It is affecting her work as she has to stand a lot at work. No other associated or modifying symptoms.    Past Medical History:  Diagnosis Date  . Barrett's esophagus   . Gastric ulcer   . Migraine   . Ovarian cyst   . Uterine fibroid     Patient Active Problem List   Diagnosis Date Noted  . 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 Menorrhagia 12/08/2016  . Anemia due to chronic blood loss 12/08/2016  . 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 Left sided abdominal pain   . Iron deficiency anemia due to chronic blood loss 12/05/2016    Past Surgical History:  Procedure Laterality Date  . ABDOMINAL HYSTERECTOMY    . BREAST LUMPECTOMY    . BREAST SURGERY    . CARPAL TUNNEL RELEASE    . ESOPHAGOGASTRODUODENOSCOPY N/A 12/07/2016   Procedure: ESOPHAGOGASTRODUODENOSCOPY (EGD);  Surgeon: Doran Stabler, MD;  Location: [facility] ENDOSCOPY;  Service: Endoscopy;  Laterality: N/A;  .  HAND SURGERY Right   . hemmorhoidectomy    . HEMORRHOID SURGERY    . HERNIA REPAIR      Current Outpatient Rx  . Order #: [id number] Class: Historical Med  . Order #: [id number] Class: Print  . Order #: [id number] Class: Print  . Order #: [id number] Class: Print  . Order #: [id number] Class: Print  . Order #: [id number] Class: Print  . Order #: [id number] Class: Print    Allergies Codeine  Family History  Problem Relation Age of Onset  . Hypertension Maternal Grandmother     Social History Social History   Tobacco Use  . Smoking status: Current Every Day Smoker    Packs/day: 0.50    Types: Cigarettes  . Smokeless tobacco: Never Used  Substance Use Topics  . Alcohol use: No  . Drug use: No    Review of Systems  All other systems negative except as documented in the HPI. All pertinent positives and negatives as reviewed in the HPI. ____________________________________________   PHYSICAL EXAM:  VITAL SIGNS: ED Triage Vitals  Enc Vitals Group     BP 06/01/18 1731 (!) 140/93     Pulse Rate 06/01/18 1731 83     Resp 06/01/18 1731 18     Temp 06/01/18 1731 98.3 F (36.8 C)     Temp Source 06/01/18 1731 Oral     SpO2 06/01/18 1731 100 %  Weight 06/01/18 1732 133 lb (60.3 kg)     Height 06/01/18 1732 5\' 5"  (1.651 m)    Constitutional: Alert and oriented. Well appearing and in no acute distress. Eyes: Conjunctivae are normal. PERRL. EOMI. Head: Atraumatic. Nose: No congestion/rhinnorhea. Mouth/Throat: Mucous membranes are moist.  Oropharynx non-erythematous. Neck: No stridor.  No meningeal signs.   Cardiovascular: Normal rate, regular rhythm. Good peripheral circulation. Grossly normal heart sounds.   Respiratory: Normal respiratory effort.  No retractions. Lungs CTAB. Gastrointestinal: Soft and diffusely tender without rebound or guarding. No distention.  Musculoskeletal: No lower extremity tenderness nor edema. No gross deformities of extremities. Neurologic:  Normal  speech and language. No gross focal neurologic deficits are appreciated.  Skin:  Skin is warm, dry and intact. No rash noted.   ____________________________________________   LABS (all labs ordered are listed, but only abnormal results are displayed)  Labs Reviewed  CBC WITH DIFFERENTIAL/PLATELET - Abnormal; Notable for the following components:      Result Value   RBC 3.79 (*)    Hemoglobin 11.0 (*)    HCT 31.7 (*)    All other components within normal limits  COMPREHENSIVE METABOLIC PANEL - Abnormal; Notable for the following components:   Chloride 112 (*)    CO2 21 (*)    Glucose, Bld 107 (*)    Calcium 8.4 (*)    AST 14 (*)    ALT 9 (*)    All other components within normal limits  URINALYSIS, ROUTINE W REFLEX MICROSCOPIC - Abnormal; Notable for the following components:   APPearance CLOUDY (*)    Hgb urine dipstick LARGE (*)    Leukocytes, UA TRACE (*)    All other components within normal limits  URINALYSIS, MICROSCOPIC (REFLEX) - Abnormal; Notable for the following components:   Bacteria, UA MANY (*)    All other components within normal limits  LIPASE, BLOOD   ____________________________________________   INITIAL IMPRESSION / ASSESSMENT AND PLAN / ED COURSE  Suspect headache is just a normal migraine we will treat her for that with a headache cocktail.  As far as her stomach goes it does seem like it could be just gas as it is colicky in nature and she has increased gas she has diffuse tenderness.  She does have history of peptic ulcer disease so would consider perforated ulcer however her vital signs are within normal limits she is afebrile and she has no evidence of peritonitis to the gets less likely at this time.  We will treat her with Bentyl, GI cocktail and reevaluate.  At this time she has no indication for imaging.  Work-up unremarkable aside from likely urinary tract infection.  Will treat for the same.  Patient will follow the primary doctor if not  improving but also to recheck her urine in about a week.  Pertinent labs & imaging results that were available during my care of the patient were reviewed by me and considered in my medical decision making (see chart for details).  ____________________________________________  FINAL CLINICAL IMPRESSION(S) / ED DIAGNOSES  Final diagnoses:  Abdominal pain, unspecified abdominal location  Urinary tract infection with hematuria, site unspecified     MEDICATIONS GIVEN DURING THIS VISIT:  Medications  lactated ringers bolus 1,000 mL (0 mLs Intravenous Stopped 06/01/18 1925)  metoCLOPramide (REGLAN) injection 10 mg (10 mg Intravenous Given 06/01/18 1817)  diphenhydrAMINE (BENADRYL) injection 25 mg (25 mg Intravenous Given 06/01/18 1817)  methylPREDNISolone sodium succinate (SOLU-MEDROL) 125 mg/2 mL injection 125 mg (125 mg  Intravenous Given 06/01/18 1818)  dicyclomine (BENTYL) capsule 10 mg (10 mg Oral Given 06/01/18 1817)  gi cocktail (Maalox,Lidocaine,Donnatal) (30 mLs Oral Given 06/01/18 1817)  cephALEXin (KEFLEX) capsule 500 mg (500 mg Oral Given 06/01/18 1924)     NEW OUTPATIENT MEDICATIONS STARTED DURING THIS VISIT:  Discharge Medication List as of 06/01/2018  7:26 PM    START taking these medications   Details  cephALEXin (KEFLEX) 500 MG capsule Take 1 capsule (500 mg total) by mouth 4 (four) times daily., Starting Sun 06/01/2018, Print        Note:  This note was prepared with assistance of Dragon voice recognition software. Occasional wrong-word or sound-a-like substitutions may have occurred due to the inherent limitations of voice recognition software.   Merrily Pew, MD 06/01/18 1958

## 2018-06-01 NOTE — ED Triage Notes (Signed)
Patient states that she feels like she has a balloon blown up in her stomach - she is very bloated. Patient states that she has generalized abdominal pain and Headache

## 2018-06-01 NOTE — ED Notes (Signed)
ED Provider at bedside. 

## 2018-06-28 ENCOUNTER — Other Ambulatory Visit: Payer: Self-pay

## 2018-06-28 ENCOUNTER — Encounter (HOSPITAL_BASED_OUTPATIENT_CLINIC_OR_DEPARTMENT_OTHER): Payer: Self-pay | Admitting: *Deleted

## 2018-06-28 ENCOUNTER — Emergency Department (HOSPITAL_BASED_OUTPATIENT_CLINIC_OR_DEPARTMENT_OTHER)
Admission: EM | Admit: 2018-06-28 | Discharge: 2018-06-28 | Disposition: A | Discharge status: Home / Self Care | Payer: Medicaid Other | Attending: Emergency Medicine | Admitting: Emergency Medicine

## 2018-06-28 DIAGNOSIS — R51 Headache: Secondary | ICD-10-CM | POA: Diagnosis present

## 2018-06-28 DIAGNOSIS — Z79899 Other long term (current) drug therapy: Secondary | ICD-10-CM | POA: Diagnosis not present

## 2018-06-28 DIAGNOSIS — R519 Headache, unspecified: Secondary | ICD-10-CM

## 2018-06-28 DIAGNOSIS — F17210 Nicotine dependence, cigarettes, uncomplicated: Secondary | ICD-10-CM | POA: Diagnosis not present

## 2018-06-28 MED ORDER — SODIUM CHLORIDE 0.9 % IV BOLUS
500.0000 mL | Freq: Once | INTRAVENOUS | Status: AC
  Administered 2018-06-28: 500 mL via INTRAVENOUS

## 2018-06-28 MED ORDER — DIPHENHYDRAMINE HCL 50 MG/ML IJ SOLN
25.0000 mg | Freq: Once | INTRAMUSCULAR | Status: AC
  Administered 2018-06-28: 25 mg via INTRAVENOUS
  Filled 2018-06-28: qty 1

## 2018-06-28 MED ORDER — METOCLOPRAMIDE HCL 5 MG/ML IJ SOLN
10.0000 mg | Freq: Once | INTRAMUSCULAR | Status: AC
  Administered 2018-06-28: 10 mg via INTRAVENOUS
  Filled 2018-06-28: qty 2

## 2018-06-28 NOTE — ED Provider Notes (Signed)
[facility] EMERGENCY DEPARTMENT Provider Note   CSN: [id number] Arrival date & time: 06/28/18  1637     History   Chief Complaint Chief Complaint  Patient presents with  . Migraine    HPI Tiffany Christensen is a 37 y.o. female.  Patient with history of migraine headaches presents with complaint of headache starting yesterday.  She describes the pain as "being beaten in the head".  She is having a throbbing pain bilaterally.  She has light sensitivity and sound sensitivity.  She has taken Excedrin and ibuprofen without relief.  No neck pain, fevers, vomiting.  Patient has had similar symptoms in the past and denies any recent head injuries.  No vision changes.  She has had migraine cocktails in the past in the emergency department which has helped her. The onset of this condition was acute. The course is constant.      Past Medical History:  Diagnosis Date  . Barrett's esophagus   . Gastric ulcer   . Migraine   . Ovarian cyst   . Uterine fibroid     Patient Active Problem List   Diagnosis Date Noted  . 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 Menorrhagia 12/08/2016  . Anemia due to chronic blood loss 12/08/2016  . 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 Left sided abdominal pain   . Iron deficiency anemia due to chronic blood loss 12/05/2016    Past Surgical History:  Procedure Laterality Date  . ABDOMINAL HYSTERECTOMY    . BREAST LUMPECTOMY    . BREAST SURGERY    . CARPAL TUNNEL RELEASE    . ESOPHAGOGASTRODUODENOSCOPY N/A 12/07/2016   Procedure: ESOPHAGOGASTRODUODENOSCOPY (EGD);  Surgeon: Doran Stabler, MD;  Location: [facility] ENDOSCOPY;  Service: Endoscopy;  Laterality: N/A;  . HAND SURGERY Right   . hemmorhoidectomy    . HEMORRHOID SURGERY    . HERNIA REPAIR       OB History   None      Home Medications    Prior to Admission medications   Medication Sig Start Date End Date Taking? Authorizing Provider  amitriptyline  (ELAVIL) 50 MG tablet Take 50 mg by mouth at bedtime.    [provider]  cephALEXin (KEFLEX) 500 MG capsule Take 1 capsule (500 mg total) by mouth 4 (four) times daily. 06/01/18   Mesner, Corene Cornea, MD  methocarbamol (ROBAXIN) 500 MG tablet Take 1 tablet (500 mg total) by mouth every 8 (eight) hours as needed for muscle spasms. 12/16/17   Petrucelli, Samantha R, PA-C  omeprazole (PRILOSEC) 40 MG capsule Take 1 capsule (40 mg total) by mouth daily for 14 days. 01/29/18 02/12/18  Duffy Bruce, MD  ondansetron (ZOFRAN ODT) 4 MG disintegrating tablet Take 1 tablet (4 mg total) by mouth every 8 (eight) hours as needed for nausea or vomiting. 01/29/18   Duffy Bruce, MD  oxyCODONE-acetaminophen (PERCOCET/ROXICET) 5-325 MG tablet Take 1 tablet by mouth every 6 (six) hours as needed for severe pain. 12/16/17   Petrucelli, Samantha R, PA-C  sucralfate (CARAFATE) 1 g tablet Take 1 tablet (1 g total) by mouth 4 (four) times daily -  with meals and at bedtime. 01/29/18   Duffy Bruce, MD    Family History Family History  Problem Relation Age of Onset  . Hypertension Maternal Grandmother     Social History Social History   Tobacco Use  . Smoking status: Current Every Day Smoker    Packs/day: 0.50    Types:  Cigarettes  . Smokeless tobacco: Never Used  Substance Use Topics  . Alcohol use: No  . Drug use: No     Allergies   Codeine   Review of Systems Review of Systems  Constitutional: Negative for fever.  HENT: Negative for congestion, dental problem, rhinorrhea and sinus pressure.   Eyes: Negative for photophobia, discharge, redness and visual disturbance.  Respiratory: Negative for shortness of breath.   Cardiovascular: Negative for chest pain.  Gastrointestinal: Positive for nausea. Negative for vomiting.  Musculoskeletal: Negative for gait problem, neck pain and neck stiffness.  Skin: Negative for rash.  Neurological: Positive for headaches. Negative for syncope, speech  difficulty, weakness, light-headedness and numbness.  Psychiatric/Behavioral: Negative for confusion.     Physical Exam Updated Vital Signs Ht 5\' 5"  (1.651 m)   Wt 60.3 kg (133 lb)   LMP 11/08/2016 (Exact Date) Comment: neg upreg  BMI 22.13 kg/m   Physical Exam  Constitutional: She is oriented to person, place, and time. She appears well-developed and well-nourished.  HENT:  Head: Normocephalic and atraumatic.  Right Ear: Tympanic membrane, external ear and ear canal normal.  Left Ear: Tympanic membrane, external ear and ear canal normal.  Nose: Nose normal.  Mouth/Throat: Uvula is midline, oropharynx is clear and moist and mucous membranes are normal.  Eyes: Pupils are equal, round, and reactive to light. Conjunctivae, EOM and lids are normal. Right eye exhibits no nystagmus. Left eye exhibits no nystagmus.  Neck: Normal range of motion. Neck supple.  Cardiovascular: Normal rate and regular rhythm.  Pulmonary/Chest: Effort normal and breath sounds normal.  Abdominal: Soft. There is no tenderness.  Musculoskeletal:       Cervical back: She exhibits normal range of motion, no tenderness and no bony tenderness.  Neurological: She is alert and oriented to person, place, and time. She has normal strength and normal reflexes. No cranial nerve deficit or sensory deficit. She displays a negative Romberg sign. Coordination and gait normal. GCS eye subscore is 4. GCS verbal subscore is 5. GCS motor subscore is 6.  Skin: Skin is warm and dry.  Psychiatric: She has a normal mood and affect.  Nursing note and vitals reviewed.    ED Treatments / Results  Labs (all labs ordered are listed, but only abnormal results are displayed) Labs Reviewed - No data to display  EKG None  Radiology No results found.  Procedures Procedures (including critical care time)  Medications Ordered in ED Medications  metoCLOPramide (REGLAN) injection 10 mg (has no administration in time range)    diphenhydrAMINE (BENADRYL) injection 25 mg (has no administration in time range)  sodium chloride 0.9 % bolus 500 mL (has no administration in time range)     Initial Impression / Assessment and Plan / ED Course  I have reviewed the triage vital signs and the nursing notes.  Pertinent labs & imaging results that were available during my care of the patient were reviewed by me and considered in my medical decision making (see chart for details).     Patient seen and examined. Medications ordered.   Vital signs reviewed and are as follows: Ht 5\' 5"  (1.651 m)   Wt 60.3 kg (133 lb)   LMP 11/08/2016 (Exact Date) Comment: neg upreg  BMI 22.13 kg/m   7:18 PM patient reports headache resolved.  She is feeling much better.  She states that she is ready to go home.  Encourage patient to rest, use her home medications as needed.  Encouraged return to  the emergency department with confusion, fever, vomiting, new symptoms or other concerns.  She verbalizes understanding and agrees with plan.  Final Clinical Impressions(s) / ED Diagnoses   Final diagnoses:  Bad headache   Patient without high-risk features of headache including: sudden onset/thunderclap HA, no similar headache in past, altered mental status, accompanying seizure, headache with exertion, age > 7, history of immunocompromise, neck or shoulder pain, fever, use of anticoagulation, family history of spontaneous SAH, concomitant drug use, toxic exposure.   Patient has a normal complete neurological exam, normal vital signs, normal level of consciousness, no signs of meningismus, is well-appearing/non-toxic appearing, no signs of trauma.  Imaging with CT/MRI not indicated given history and physical exam findings.   No dangerous or life-threatening conditions suspected or identified by history, physical exam, and by work-up. No indications for hospitalization identified.    ED Discharge Orders    None       Carlisle Cater,  Hershal Coria 06/28/18 1918    Tegeler, Gwenyth Allegra, MD 06/29/18 [id number]

## 2018-06-28 NOTE — Discharge Instructions (Signed)
Please read and follow all provided instructions.  Your diagnoses today include:  1. Bad headache     Tests performed today include:  Vital signs. See below for your results today.   Medications:  In the Emergency Department you received:  Reglan - antinausea/headache medication  Benadryl - antihistamine to counteract potential side effects of reglan  Take any prescribed medications only as directed.  Additional information:  Follow any educational materials contained in this packet.  You are having a headache. No specific cause was found today for your headache. It may have been a migraine or other cause of headache. Stress, anxiety, fatigue, and depression are common triggers for headaches.   Your headache today does not appear to be life-threatening or require hospitalization, but often the exact cause of headaches is not determined in the emergency department. Therefore, follow-up with your doctor is very important to find out what may have caused your headache and whether or not you need any further diagnostic testing or treatment.   Sometimes headaches can appear benign (not harmful), but then more serious symptoms can develop which should prompt an immediate re-evaluation by your doctor or the emergency department.  BE VERY CAREFUL not to take multiple medicines containing Tylenol (also called acetaminophen). Doing so can lead to an overdose which can damage your liver and cause liver failure and possibly death.   Follow-up instructions: Please follow-up with your primary care provider in the next 3 days for further evaluation of your symptoms.   Return instructions:   Please return to the Emergency Department if you experience worsening symptoms.  Return if the medications do not resolve your headache, if it recurs, or if you have multiple episodes of vomiting or cannot keep down fluids.  Return if you have a change from the usual headache.  RETURN IMMEDIATELY IF  you:  Develop a sudden, severe headache  Develop confusion or become poorly responsive or faint  Develop a fever above 100.40F or problem breathing  Have a change in speech, vision, swallowing, or understanding  Develop new weakness, numbness, tingling, incoordination in your arms or legs  Have a seizure  Please return if you have any other emergent concerns.  Additional Information:  Your vital signs today were: BP (!) 132/97 (BP Location: Right Arm)    Pulse 74    Resp 14    Ht 5\' 5"  (1.651 m)    Wt 60.3 kg (133 lb)    LMP 11/08/2016 (Exact Date) Comment: neg upreg   SpO2 100%    BMI 22.13 kg/m  If your blood pressure (BP) was elevated above 135/85 this visit, please have this repeated by your doctor within one month. --------------

## 2018-06-28 NOTE — ED Notes (Signed)
Pt reports feeling better

## 2018-06-28 NOTE — ED Triage Notes (Signed)
Hx of MHA. C/o migraine since last night. Taking OTC meds without relief. Endorses nausea

## 2018-06-28 NOTE — ED Notes (Signed)
ED Provider at bedside. 

## 2018-08-10 ENCOUNTER — Encounter (HOSPITAL_BASED_OUTPATIENT_CLINIC_OR_DEPARTMENT_OTHER): Payer: Self-pay | Admitting: *Deleted

## 2018-08-10 ENCOUNTER — Other Ambulatory Visit: Payer: Self-pay

## 2018-08-10 ENCOUNTER — Emergency Department (HOSPITAL_BASED_OUTPATIENT_CLINIC_OR_DEPARTMENT_OTHER): Payer: Medicaid Other

## 2018-08-10 ENCOUNTER — Emergency Department (HOSPITAL_BASED_OUTPATIENT_CLINIC_OR_DEPARTMENT_OTHER)
Admission: EM | Admit: 2018-08-10 | Discharge: 2018-08-11 | Disposition: A | Discharge status: Home / Self Care | Payer: Medicaid Other | Attending: Emergency Medicine | Admitting: Emergency Medicine

## 2018-08-10 DIAGNOSIS — Y9301 Activity, walking, marching and hiking: Secondary | ICD-10-CM | POA: Insufficient documentation

## 2018-08-10 DIAGNOSIS — S92511A Displaced fracture of proximal phalanx of right lesser toe(s), initial encounter for closed fracture: Secondary | ICD-10-CM | POA: Diagnosis not present

## 2018-08-10 DIAGNOSIS — Y999 Unspecified external cause status: Secondary | ICD-10-CM | POA: Diagnosis not present

## 2018-08-10 DIAGNOSIS — W108XXA Fall (on) (from) other stairs and steps, initial encounter: Secondary | ICD-10-CM | POA: Diagnosis not present

## 2018-08-10 DIAGNOSIS — Z79899 Other long term (current) drug therapy: Secondary | ICD-10-CM | POA: Diagnosis not present

## 2018-08-10 DIAGNOSIS — F17210 Nicotine dependence, cigarettes, uncomplicated: Secondary | ICD-10-CM | POA: Insufficient documentation

## 2018-08-10 DIAGNOSIS — Y929 Unspecified place or not applicable: Secondary | ICD-10-CM | POA: Insufficient documentation

## 2018-08-10 DIAGNOSIS — S99921A Unspecified injury of right foot, initial encounter: Secondary | ICD-10-CM | POA: Diagnosis present

## 2018-08-10 MED ORDER — OXYCODONE-ACETAMINOPHEN 5-325 MG PO TABS: 1.0000 | ORAL_TABLET | Freq: Four times a day (QID) | ORAL | 0 refills | Status: DC | PRN

## 2018-08-10 MED ORDER — ONDANSETRON 4 MG PO TBDP
ORAL_TABLET | ORAL | Status: AC
  Administered 2018-08-10: 4 mg via ORAL
  Filled 2018-08-10: qty 1

## 2018-08-10 MED ORDER — ONDANSETRON 4 MG PO TBDP
4.0000 mg | ORAL_TABLET | Freq: Once | ORAL | Status: AC | PRN
Start: 2018-08-10 — End: 2018-08-10
  Administered 2018-08-10: 4 mg via ORAL

## 2018-08-10 MED ORDER — IBUPROFEN 600 MG PO TABS: 600.0000 mg | ORAL_TABLET | Freq: Four times a day (QID) | ORAL | 0 refills | Status: DC | PRN

## 2018-08-10 MED ORDER — OXYCODONE-ACETAMINOPHEN 5-325 MG PO TABS
1.0000 | ORAL_TABLET | ORAL | Status: DC | PRN
  Administered 2018-08-10: 1 via ORAL
  Filled 2018-08-10: qty 1

## 2018-08-10 MED ORDER — OXYCODONE-ACETAMINOPHEN 5-325 MG PO TABS
1.0000 | ORAL_TABLET | Freq: Once | ORAL | Status: AC
  Administered 2018-08-10: 1 via ORAL
  Filled 2018-08-10: qty 1

## 2018-08-10 NOTE — ED Notes (Signed)
PMS intact before and after. Pt tolerated well. All questions answered. 

## 2018-08-10 NOTE — Discharge Instructions (Addendum)
You have a fracture of your second toe.  Keep it buddy taped your first toe.  Wear a postop shoe for comfort and for healing.  You may need to modify work duties until your pain is improved.

## 2018-08-10 NOTE — ED Notes (Signed)
Pt states she is not allergic to codeine, she has taken it recently

## 2018-08-10 NOTE — ED Provider Notes (Signed)
[facility] Provider Note   CSN: [id number] Arrival date & time: 08/10/18  2140     History   Chief Complaint Chief Complaint  Patient presents with  . Foot Injury    HPI Tiffany Christensen is a 37 y.o. female.  HPI  This is a 37 year old female who presents with right second toe pain.  Patient reports misstepped and twisted her right foot going down the steps.  She denies hitting her head or loss of consciousness.  She is only complaining of pain in the right second toe.  She states pain is worse with ambulation.  Currently her pain is 8 out of 10.  She was given Percocet in triage with some relief of her pain.  Denies numbness and tingling of the foot.  Denies any ankle or knee pain.  Past Medical History:  Diagnosis Date  . Barrett's esophagus   . Gastric ulcer   . Migraine   . Ovarian cyst   . Uterine fibroid     Patient Active Problem List   Diagnosis Date Noted  . 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 Menorrhagia 12/08/2016  . Anemia due to chronic blood loss 12/08/2016  . 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 Left sided abdominal pain   . Iron deficiency anemia due to chronic blood loss 12/05/2016    Past Surgical History:  Procedure Laterality Date  . ABDOMINAL HYSTERECTOMY    . BREAST LUMPECTOMY    . BREAST SURGERY    . CARPAL TUNNEL RELEASE    . ESOPHAGOGASTRODUODENOSCOPY N/A 12/07/2016   Procedure: ESOPHAGOGASTRODUODENOSCOPY (EGD);  Surgeon: Doran Stabler, MD;  Location: [facility] ENDOSCOPY;  Service: Endoscopy;  Laterality: N/A;  . HAND SURGERY Right   . hemmorhoidectomy    . HEMORRHOID SURGERY    . HERNIA REPAIR       OB History   None      Home Medications    Prior to Admission medications   Medication Sig Start Date End Date Taking? Authorizing Provider  amitriptyline (ELAVIL) 50 MG tablet Take 50 mg by mouth at bedtime.    [provider]  cephALEXin (KEFLEX) 500 MG  capsule Take 1 capsule (500 mg total) by mouth 4 (four) times daily. 06/01/18   Mesner, Corene Cornea, MD  ibuprofen (ADVIL,MOTRIN) 600 MG tablet Take 1 tablet (600 mg total) by mouth every 6 (six) hours as needed. 08/10/18   Horton, Barbette Hair, MD  methocarbamol (ROBAXIN) 500 MG tablet Take 1 tablet (500 mg total) by mouth every 8 (eight) hours as needed for muscle spasms. 12/16/17   Petrucelli, Samantha R, PA-C  omeprazole (PRILOSEC) 40 MG capsule Take 1 capsule (40 mg total) by mouth daily for 14 days. 01/29/18 02/12/18  Duffy Bruce, MD  ondansetron (ZOFRAN ODT) 4 MG disintegrating tablet Take 1 tablet (4 mg total) by mouth every 8 (eight) hours as needed for nausea or vomiting. 01/29/18   Duffy Bruce, MD  oxyCODONE-acetaminophen (PERCOCET/ROXICET) 5-325 MG tablet Take 1 tablet by mouth every 6 (six) hours as needed for severe pain. 08/10/18   Horton, Barbette Hair, MD  sucralfate (CARAFATE) 1 g tablet Take 1 tablet (1 g total) by mouth 4 (four) times daily -  with meals and at bedtime. 01/29/18   Duffy Bruce, MD    Family History Family History  Problem Relation Age of Onset  . Hypertension Maternal Grandmother     Social History Social History   Tobacco Use  .  Smoking status: Current Every Day Smoker    Packs/day: 0.50    Types: Cigarettes  . Smokeless tobacco: Never Used  Substance Use Topics  . Alcohol use: No  . Drug use: No     Allergies   Codeine   Review of Systems Review of Systems  Musculoskeletal:       Toe pain  Skin: Negative for wound.  Neurological: Negative for weakness and numbness.  All other systems reviewed and are negative.    Physical Exam Updated Vital Signs BP (!) 144/101 (BP Location: Right Arm)   Pulse (!) 105   Temp 99 F (37.2 C) (Oral)   Resp (!) 24   Ht 5\' 5"  (1.651 m)   Wt 63.5 kg   LMP 11/08/2016 (Exact Date) Comment: neg upreg  SpO2 99%   BMI 23.30 kg/m   Physical Exam  Constitutional: She is oriented to person, place, and time. She  appears well-developed and well-nourished.  HENT:  Head: Normocephalic and atraumatic.  Eyes: Pupils are equal, round, and reactive to light.  Cardiovascular: Normal rate and regular rhythm.  Pulmonary/Chest: Effort normal. No respiratory distress.  Musculoskeletal:  Focused examination of the right foot reveals tenderness to palpation over the proximal second digit with mild swelling noted, no overlying skin changes, normal range of motion with flexion and extension, 2+ DP pulse  Neurological: She is alert and oriented to person, place, and time.  Skin: Skin is warm and dry.  Psychiatric: She has a normal mood and affect.  Nursing note and vitals reviewed.    ED Treatments / Results  Labs (all labs ordered are listed, but only abnormal results are displayed) Labs Reviewed - No data to display  EKG None  Radiology Dg Ankle Complete Right  Result Date: 08/10/2018 CLINICAL DATA:  [address] down stairs.  Pain and swelling. EXAM: RIGHT FOOT COMPLETE - 3+ VIEW; RIGHT ANKLE - COMPLETE 3+ VIEW COMPARISON:  None. FINDINGS: RIGHT foot: Acute second proximal phalanx distal aspect metadiaphyseal fracture with impaction, mild lateral angulation distal bony fragments. No intra-articular extension. No dislocation. No destructive bony lesions. There is no evidence of arthropathy or other focal bone abnormality. Forefoot soft tissue swelling without subcutaneous gas or radiopaque foreign bodies. RIGHT ankle: No fracture deformity nor dislocation. Small plantar calcaneal spur. The ankle mortise appears congruent and the tibiofibular syndesmosis intact. No destructive bony lesions. Soft tissue planes are non-suspicious. IMPRESSION: Acute displaced second proximal phalanx fracture. Electronically Signed   By: Elon Alas M.D.   On: 08/10/2018 23:18   Dg Foot Complete Right  Result Date: 08/10/2018 CLINICAL DATA:  [address] down stairs.  Pain and swelling. EXAM: RIGHT FOOT COMPLETE - 3+ VIEW; RIGHT ANKLE -  COMPLETE 3+ VIEW COMPARISON:  None. FINDINGS: RIGHT foot: Acute second proximal phalanx distal aspect metadiaphyseal fracture with impaction, mild lateral angulation distal bony fragments. No intra-articular extension. No dislocation. No destructive bony lesions. There is no evidence of arthropathy or other focal bone abnormality. Forefoot soft tissue swelling without subcutaneous gas or radiopaque foreign bodies. RIGHT ankle: No fracture deformity nor dislocation. Small plantar calcaneal spur. The ankle mortise appears congruent and the tibiofibular syndesmosis intact. No destructive bony lesions. Soft tissue planes are non-suspicious. IMPRESSION: Acute displaced second proximal phalanx fracture. Electronically Signed   By: Elon Alas M.D.   On: 08/10/2018 23:18    Procedures Procedures (including critical care time)  Medications Ordered in ED Medications  oxyCODONE-acetaminophen (PERCOCET/ROXICET) 5-325 MG per tablet 1 tablet (1 tablet Oral Given 08/10/18  2159)  oxyCODONE-acetaminophen (PERCOCET/ROXICET) 5-325 MG per tablet 1 tablet (has no administration in time range)  ondansetron (ZOFRAN-ODT) disintegrating tablet 4 mg (4 mg Oral Given 08/10/18 2159)     Initial Impression / Assessment and Plan / ED Course  I have reviewed the triage vital signs and the nursing notes.  Pertinent labs & imaging results that were available during my care of the patient were reviewed by me and considered in my medical decision making (see chart for details).     She presents with right second toe pain.  She is overall nontoxic-appearing.  Some swelling noted on exam.  X-rays notable for displaced proximal phalanx with slight angulation.  Do not feel that would be able to achieve any significant reduction given location.  Patient was given pain medicine.  Will place in a postop shoe and buddy tape to the first phalanx.  Work modifications and pain control at home.  This will likely heal well on its own.   Follow-up with orthopedics as needed.  After history, exam, and medical workup I feel the patient has been appropriately medically screened and is safe for discharge home. Pertinent diagnoses were discussed with the patient. Patient was given return precautions.   Final Clinical Impressions(s) / ED Diagnoses   Final diagnoses:  Closed displaced fracture of proximal phalanx of lesser toe of right foot, initial encounter    ED Discharge Orders         Ordered    oxyCODONE-acetaminophen (PERCOCET/ROXICET) 5-325 MG tablet  Every 6 hours PRN     08/10/18 2336    ibuprofen (ADVIL,MOTRIN) 600 MG tablet  Every 6 hours PRN     08/10/18 2336           Merryl Hacker, MD 08/10/18 2341

## 2018-08-10 NOTE — ED Triage Notes (Addendum)
Pt states she fell down 5 steps this afternoon and her right foot twisted under her. Foot is swollen. Pt tearful and restless in triage. She has not taken anything for pain. She states she may throw up

## 2018-08-10 NOTE — ED Notes (Signed)
Family at bedside. 

## 2018-08-11 NOTE — ED Notes (Signed)
Patient is alert and orientedx4.  Patient was explained discharge instructions and they understood them with no questions.  The patient's brother, Joua Bake is taking the patient home.

## 2018-12-24 ENCOUNTER — Emergency Department (HOSPITAL_BASED_OUTPATIENT_CLINIC_OR_DEPARTMENT_OTHER)
Admission: EM | Admit: 2018-12-24 | Discharge: 2018-12-24 | Disposition: A | Discharge status: Home / Self Care | Payer: Medicaid Other | Attending: Emergency Medicine | Admitting: Emergency Medicine

## 2018-12-24 ENCOUNTER — Encounter (HOSPITAL_BASED_OUTPATIENT_CLINIC_OR_DEPARTMENT_OTHER): Payer: Self-pay | Admitting: Emergency Medicine

## 2018-12-24 ENCOUNTER — Emergency Department (HOSPITAL_BASED_OUTPATIENT_CLINIC_OR_DEPARTMENT_OTHER)
Admission: EM | Admit: 2018-12-24 | Discharge: 2018-12-25 | Disposition: A | Discharge status: Home / Self Care | Payer: Medicaid Other | Source: Home / Self Care | Attending: Emergency Medicine | Admitting: Emergency Medicine

## 2018-12-24 ENCOUNTER — Other Ambulatory Visit: Payer: Self-pay

## 2018-12-24 DIAGNOSIS — F17210 Nicotine dependence, cigarettes, uncomplicated: Secondary | ICD-10-CM | POA: Diagnosis not present

## 2018-12-24 DIAGNOSIS — R21 Rash and other nonspecific skin eruption: Secondary | ICD-10-CM

## 2018-12-24 DIAGNOSIS — T781XXA Other adverse food reactions, not elsewhere classified, initial encounter: Secondary | ICD-10-CM

## 2018-12-24 DIAGNOSIS — Z79899 Other long term (current) drug therapy: Secondary | ICD-10-CM | POA: Insufficient documentation

## 2018-12-24 DIAGNOSIS — L299 Pruritus, unspecified: Secondary | ICD-10-CM

## 2018-12-24 DIAGNOSIS — T7840XA Allergy, unspecified, initial encounter: Secondary | ICD-10-CM

## 2018-12-24 MED ORDER — PREDNISONE 50 MG PO TABS
60.0000 mg | ORAL_TABLET | Freq: Once | ORAL | Status: AC
  Administered 2018-12-24: 60 mg via ORAL
  Filled 2018-12-24: qty 1

## 2018-12-24 MED ORDER — EPINEPHRINE 0.3 MG/0.3ML IJ SOAJ: 0.3000 mg | Freq: Once | INTRAMUSCULAR | 0 refills | Status: AC

## 2018-12-24 MED ORDER — DIPHENHYDRAMINE HCL 50 MG/ML IJ SOLN
50.0000 mg | Freq: Once | INTRAMUSCULAR | Status: AC
  Administered 2018-12-24: 50 mg via INTRAVENOUS
  Filled 2018-12-24: qty 1

## 2018-12-24 MED ORDER — FAMOTIDINE IN NACL 20-0.9 MG/50ML-% IV SOLN
20.0000 mg | Freq: Once | INTRAVENOUS | Status: AC
  Administered 2018-12-24: 20 mg via INTRAVENOUS
  Filled 2018-12-24: qty 50

## 2018-12-24 MED ORDER — FAMOTIDINE 20 MG PO TABS: 20.0000 mg | ORAL_TABLET | Freq: Two times a day (BID) | ORAL | 0 refills | Status: DC

## 2018-12-24 MED ORDER — SODIUM CHLORIDE 0.9 % IV BOLUS
1000.0000 mL | Freq: Once | INTRAVENOUS | Status: AC
  Administered 2018-12-24: 1000 mL via INTRAVENOUS

## 2018-12-24 MED ORDER — LORAZEPAM 2 MG/ML IJ SOLN
1.0000 mg | Freq: Once | INTRAMUSCULAR | Status: DC
  Filled 2018-12-24: qty 1

## 2018-12-24 MED ORDER — DIPHENHYDRAMINE HCL 25 MG PO CAPS
25.0000 mg | ORAL_CAPSULE | Freq: Once | ORAL | Status: AC
  Administered 2018-12-24: 25 mg via ORAL
  Filled 2018-12-24: qty 1

## 2018-12-24 MED ORDER — ONDANSETRON HCL 4 MG/2ML IJ SOLN
4.0000 mg | Freq: Once | INTRAMUSCULAR | Status: AC
  Administered 2018-12-24: 4 mg via INTRAVENOUS
  Filled 2018-12-24: qty 2

## 2018-12-24 MED ORDER — FAMOTIDINE 20 MG PO TABS
20.0000 mg | ORAL_TABLET | Freq: Once | ORAL | Status: AC
Start: 2018-12-24 — End: 2018-12-24
  Administered 2018-12-24: 20 mg via ORAL
  Filled 2018-12-24: qty 1

## 2018-12-24 MED ORDER — PREDNISONE 50 MG PO TABS: 50.0000 mg | ORAL_TABLET | Freq: Every day | ORAL | 0 refills | Status: DC

## 2018-12-24 NOTE — ED Notes (Signed)
ED Provider at bedside. 

## 2018-12-24 NOTE — ED Notes (Signed)
Pt updated in waiting room about room status.

## 2018-12-24 NOTE — ED Triage Notes (Signed)
Pt was seen today for allergic reaction , states nausea and unable to keep food down, also reports persistent itching from today's allergic reaction. Alert and oriented x 4.

## 2018-12-24 NOTE — ED Provider Notes (Signed)
[facility] DEPARTMENT Provider Note   CSN: [id number] Arrival date & time: 12/24/18  1046     History   Chief Complaint Chief Complaint  Patient presents with  . Allergic Reaction    HPI JENNILEE DEMARCO is a 38 y.o. female.  The history is provided by the patient. No language interpreter was used.  Allergic Reaction  Presenting symptoms: itching and rash   Presenting symptoms: no wheezing   Severity:  Moderate Duration:  12 hours Prior allergic episodes:  Food/nut allergies Context: food (mushroom)   Relieved by:  Antihistamines Worsened by:  Nothing Ineffective treatments:  None tried   Past Medical History:  Diagnosis Date  . Barrett's esophagus   . Gastric ulcer   . Migraine   . Ovarian cyst   . Uterine fibroid     Patient Active Problem List   Diagnosis Date Noted  . 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 Menorrhagia 12/08/2016  . Anemia due to chronic blood loss 12/08/2016  . 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 Left sided abdominal pain   . Iron deficiency anemia due to chronic blood loss 12/05/2016    Past Surgical History:  Procedure Laterality Date  . ABDOMINAL HYSTERECTOMY    . BREAST LUMPECTOMY    . BREAST SURGERY    . CARPAL TUNNEL RELEASE    . ESOPHAGOGASTRODUODENOSCOPY N/A 12/07/2016   Procedure: ESOPHAGOGASTRODUODENOSCOPY (EGD);  Surgeon: Doran Stabler, MD;  Location: [facility] ENDOSCOPY;  Service: Endoscopy;  Laterality: N/A;  . HAND SURGERY Right   . hemmorhoidectomy    . HEMORRHOID SURGERY    . HERNIA REPAIR       OB History   No obstetric history on file.      Home Medications    Prior to Admission medications   Medication Sig Start Date End Date Taking? Authorizing Provider  pantoprazole (PROTONIX) 40 MG tablet Two (2) times a day. 12/11/16  Yes [provider]  amitriptyline (ELAVIL) 50 MG tablet Take 50 mg by mouth at bedtime.    [provider]    ibuprofen (ADVIL,MOTRIN) 600 MG tablet Take 1 tablet (600 mg total) by mouth every 6 (six) hours as needed. 08/10/18   Horton, Barbette Hair, MD  methocarbamol (ROBAXIN) 500 MG tablet Take 1 tablet (500 mg total) by mouth every 8 (eight) hours as needed for muscle spasms. 12/16/17   Petrucelli, Glynda Jaeger, PA-C    Family History Family History  Problem Relation Age of Onset  . Hypertension Maternal Grandmother     Social History Social History   Tobacco Use  . Smoking status: Current Every Day Smoker    Packs/day: 0.50    Types: Cigarettes  . Smokeless tobacco: Never Used  Substance Use Topics  . Alcohol use: No  . Drug use: No     Allergies   Codeine   Review of Systems Review of Systems  Constitutional: Negative for chills, fatigue and fever.  HENT: Negative for congestion.   Eyes: Negative for visual disturbance.  Respiratory: Negative for cough, chest tightness, shortness of breath and wheezing.   Cardiovascular: Negative for chest pain and palpitations.  Gastrointestinal: Positive for nausea. Negative for abdominal pain and vomiting.  Genitourinary: Negative for dysuria, enuresis and flank pain.  Musculoskeletal: Negative for back pain, neck pain and neck stiffness.  Skin: Positive for itching and rash. Negative for wound.  Neurological: Negative for light-headedness and headaches.  Psychiatric/Behavioral: Negative for agitation.  All  other systems reviewed and are negative.    Physical Exam Updated Vital Signs BP 123/78   Pulse 91   Temp 98.2 F (36.8 C) (Oral)   Resp 16   Ht 5\' 5"  (1.651 m)   Wt 57.2 kg   LMP 11/08/2016 (Exact Date) Comment: neg upreg  SpO2 100%   BMI 20.98 kg/m   Physical Exam Vitals signs and nursing note reviewed.  Constitutional:      General: She is not in acute distress.    Appearance: She is well-developed. She is not ill-appearing, toxic-appearing or diaphoretic.  HENT:     Head: Normocephalic and atraumatic.     Nose: No  congestion or rhinorrhea.     Mouth/Throat:     Pharynx: No oropharyngeal exudate or posterior oropharyngeal erythema.  Eyes:     Conjunctiva/sclera: Conjunctivae normal.  Neck:     Musculoskeletal: Neck supple.  Cardiovascular:     Rate and Rhythm: Normal rate and regular rhythm.     Pulses: Normal pulses.     Heart sounds: No murmur.  Pulmonary:     Effort: Pulmonary effort is normal. No respiratory distress.     Breath sounds: Normal breath sounds. No wheezing, rhonchi or rales.  Chest:     Chest wall: No tenderness.  Abdominal:     Palpations: Abdomen is soft.     Tenderness: There is no abdominal tenderness.  Skin:    General: Skin is warm and dry.     Capillary Refill: Capillary refill takes less than 2 seconds.     Findings: Rash present.  Neurological:     General: No focal deficit present.     Mental Status: She is alert and oriented to person, place, and time.     Sensory: No sensory deficit.     Motor: No weakness.  Psychiatric:        Mood and Affect: Mood normal.      ED Treatments / Results  Labs (all labs ordered are listed, but only abnormal results are displayed) Labs Reviewed - No data to display  EKG None  Radiology No results found.  Procedures Procedures (including critical care time)  Medications Ordered in ED Medications  diphenhydrAMINE (BENADRYL) capsule 25 mg (25 mg Oral Given 12/24/18 1254)  predniSONE (DELTASONE) tablet 60 mg (60 mg Oral Given 12/24/18 1254)  famotidine (PEPCID) tablet 20 mg (20 mg Oral Given 12/24/18 1254)     Initial Impression / Assessment and Plan / ED Course  I have reviewed the triage vital signs and the nursing notes.  Pertinent labs & imaging results that were available during my care of the patient were reviewed by me and considered in my medical decision making (see chart for details).     SHANICKA OLDENKAMP is a 38 y.o. female with a past medical history significant for known mushroom allergy presents with  mushroom exposure and subsequent rash and pruritus.  Patient reports that despite telling the [employer] that she could not eat anything with mushrooms in it, they provided her a dish with a mushroom on the bottom.  She quickly started having itching all over her body including diffuse rash.  She denies vomiting but does report some nausea.  She reports no shortness of breath, diaphoresis, chest pain, or palpitations.  She denies any lip swelling, tongue swelling, or difficulty breathing.  She reports that this is a worsened reaction that she has had in the past.  She took some Benadryl and went home and  had symptoms improved however this morning they returned and were worsened.  On exam, patient has a fine rash all over her body that is very faint.  There no evidence of rash inside her mouth.  No stridor.  Patient's oropharyngeal exam unremarkable with no significant edema seen.  Chest and back were nontender.  No focal neurologic deficits.  Patient resting comfortably on room air without difficulty.  Exam otherwise unremarkable.  Given lack of airway complications, do not feel patient needs epinephrine however she will get steroids and antihistamines.  Patient given Pepcid, Benadryl, and prednisone with improvement in symptoms.  Patient still has a very mild pruritic sensation but is much improved.  Patient is demonstrated ability for several hours and we feel she is safe for discharge home.  She will give prescription for antihistamines, steroids, and an EpiPen.  Patient will follow-up with PCP for further management.  Patient with questions or concerns and was discharged in good condition.`      Final Clinical Impressions(s) / ED Diagnoses   Final diagnoses:  Allergic reaction, initial encounter  Rash  Allergic reaction to food, initial encounter  Pruritus    ED Discharge Orders         Ordered    predniSONE (DELTASONE) 50 MG tablet  Daily     12/24/18 1443    EPINEPHrine 0.3 mg/0.3 mL  IJ SOAJ injection   Once     12/24/18 1443    famotidine (PEPCID) 20 MG tablet  2 times daily     12/24/18 1443          Clinical Impression: 1. Allergic reaction, initial encounter   2. Rash   3. Allergic reaction to food, initial encounter   4. Pruritus     Disposition: Discharge  Condition: Good  I have discussed the results, Dx and Tx plan with the pt(& family if present). He/she/they expressed understanding and agree(s) with the plan. Discharge instructions discussed at great length. Strict return precautions discussed and pt &/or family have verbalized understanding of the instructions. No further questions at time of discharge.    Discharge Medication List as of 12/24/2018  2:44 PM    START taking these medications   Details  EPINEPHrine 0.3 mg/0.3 mL IJ SOAJ injection Inject 0.3 mLs (0.3 mg total) into the muscle once for 1 dose., Starting Wed 12/24/2018, Print    famotidine (PEPCID) 20 MG tablet Take 1 tablet (20 mg total) by mouth 2 (two) times daily for 7 days., Starting Wed 12/24/2018, Until Wed 12/31/2018, Print    predniSONE (DELTASONE) 50 MG tablet Take 1 tablet (50 mg total) by mouth daily., Starting Thu 12/25/2018, Print        Follow Up: Center, [place] [address] [place] [address] [id number] [address] [place] [phone]       , Gwenyth Allegra, MD 12/24/18 1520

## 2018-12-24 NOTE — ED Notes (Addendum)
Pt talkking on phone during assessment, laughing and making jokes. Pt c/o epigastric pain that started tonight. Pt states she believes r/t emesis. Pt states last emesis episode x 20 ago. Pt denies fever. Pt also c/o itching r/t allergic reaction

## 2018-12-24 NOTE — ED Triage Notes (Signed)
Pt here with itching all over her body after having mushrooms last night. No hives, no breathing difficulty.

## 2018-12-24 NOTE — ED Notes (Signed)
Held ativan, pt states she is driving.

## 2018-12-24 NOTE — Discharge Instructions (Signed)
Your history and exam today are consistent with allergic reaction causing her symptoms.  Please take the steroids for the next 4 days starting tomorrow.  Please fill a prescription for EpiPen to use if you ever have another reaction and cannot breathe.  Please use the antihistamines with Benadryl at home and the Pepcid.  Please follow-up with your primary doctor.  If any symptoms change or worsen, please return to the nearest emergency department.

## 2018-12-24 NOTE — ED Notes (Signed)
Pt provided drink and snack per request

## 2018-12-25 NOTE — ED Notes (Signed)
ED Provider at bedside. 

## 2018-12-25 NOTE — ED Provider Notes (Signed)
[facility] Provider Note   CSN: [id number] Arrival date & time: 12/24/18  2136     History   Chief Complaint Chief Complaint  Patient presents with  . Emesis  . Abdominal Pain    HPI Tiffany Christensen is a 38 y.o. female.  HPI Patient is a 38 year old female was seen the emergency department earlier with allergic reaction thought to be secondary to possible mushroom ingestion which is occurred before for the patient.  She was feeling much better at time of discharge.  She was treated with steroids, Pepcid, Benadryl.  She went home and took a nap and when she awoke she was having itching all over and complained of new epigastric pain with associated nausea and vomiting.  She still feels nauseated at this time.  No more vomiting since arrival in the emergency department.  She states she feels like she is itching again despite the Benadryl that she took at home.   Past Medical History:  Diagnosis Date  . Barrett's esophagus   . Gastric ulcer   . Migraine   . Ovarian cyst   . Uterine fibroid     Patient Active Problem List   Diagnosis Date Noted  . 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 Menorrhagia 12/08/2016  . Anemia due to chronic blood loss 12/08/2016  . 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 Left sided abdominal pain   . Iron deficiency anemia due to chronic blood loss 12/05/2016    Past Surgical History:  Procedure Laterality Date  . ABDOMINAL HYSTERECTOMY    . BREAST LUMPECTOMY    . BREAST SURGERY    . CARPAL TUNNEL RELEASE    . ESOPHAGOGASTRODUODENOSCOPY N/A 12/07/2016   Procedure: ESOPHAGOGASTRODUODENOSCOPY (EGD);  Surgeon: Doran Stabler, MD;  Location: [facility] ENDOSCOPY;  Service: Endoscopy;  Laterality: N/A;  . HAND SURGERY Right   . hemmorhoidectomy    . HEMORRHOID SURGERY    . HERNIA REPAIR       OB History   No obstetric history on file.      Home Medications    Prior to Admission  medications   Medication Sig Start Date End Date Taking? Authorizing Provider  amitriptyline (ELAVIL) 50 MG tablet Take 50 mg by mouth at bedtime.    [provider]  famotidine (PEPCID) 20 MG tablet Take 1 tablet (20 mg total) by mouth 2 (two) times daily for 7 days. 12/24/18 12/31/18  Tegeler, Gwenyth Allegra, MD  ibuprofen (ADVIL,MOTRIN) 600 MG tablet Take 1 tablet (600 mg total) by mouth every 6 (six) hours as needed. 08/10/18   Horton, Barbette Hair, MD  methocarbamol (ROBAXIN) 500 MG tablet Take 1 tablet (500 mg total) by mouth every 8 (eight) hours as needed for muscle spasms. 12/16/17   Petrucelli, Samantha R, PA-C  pantoprazole (PROTONIX) 40 MG tablet Two (2) times a day. 12/11/16   [provider]  predniSONE (DELTASONE) 50 MG tablet Take 1 tablet (50 mg total) by mouth daily. 12/25/18   Tegeler, Gwenyth Allegra, MD    Family History Family History  Problem Relation Age of Onset  . Hypertension Maternal Grandmother     Social History Social History   Tobacco Use  . Smoking status: Current Every Day Smoker    Packs/day: 0.50    Types: Cigarettes  . Smokeless tobacco: Never Used  Substance Use Topics  . Alcohol use: No  . Drug use: No     Allergies  Codeine   Review of Systems Review of Systems  All other systems reviewed and are negative.    Physical Exam Updated Vital Signs BP 110/72   Pulse 100   Temp 98.7 F (37.1 C)   Resp 18   Ht 5\' 5"  (1.651 m)   Wt 57.8 kg   LMP 11/08/2016 (Exact Date) Comment: neg upreg  SpO2 100%   BMI 21.20 kg/m   Physical Exam Vitals signs and nursing note reviewed.  Constitutional:      General: She is not in acute distress.    Appearance: She is well-developed.  HENT:     Head: Normocephalic and atraumatic.  Neck:     Musculoskeletal: Normal range of motion.  Cardiovascular:     Rate and Rhythm: Normal rate and regular rhythm.     Heart sounds: Normal heart sounds.  Pulmonary:     Effort: Pulmonary effort is  normal.     Breath sounds: Normal breath sounds.  Abdominal:     General: There is no distension.     Palpations: Abdomen is soft.     Tenderness: There is no abdominal tenderness.  Musculoskeletal: Normal range of motion.  Skin:    General: Skin is warm and dry.  Neurological:     Mental Status: She is alert and oriented to person, place, and time.  Psychiatric:        Judgment: Judgment normal.      ED Treatments / Results  Labs (all labs ordered are listed, but only abnormal results are displayed) Labs Reviewed - No data to display  EKG None  Radiology No results found.  Procedures Procedures (including critical care time)  Medications Ordered in ED Medications  LORazepam (ATIVAN) injection 1 mg (1 mg Intravenous Not Given 12/24/18 2343)  diphenhydrAMINE (BENADRYL) injection 50 mg (50 mg Intravenous Given 12/24/18 2342)  famotidine (PEPCID) IVPB 20 mg premix (0 mg Intravenous Stopped 12/25/18 0037)  ondansetron (ZOFRAN) injection 4 mg (4 mg Intravenous Given 12/24/18 2341)  sodium chloride 0.9 % bolus 1,000 mL (1,000 mLs Intravenous New Bag/Given 12/24/18 2344)     Initial Impression / Assessment and Plan / ED Course  I have reviewed the triage vital signs and the nursing notes.  Pertinent labs & imaging results that were available during my care of the patient were reviewed by me and considered in my medical decision making (see chart for details).     No overt urticaria at this time.  Feels much better after treatment in the emergency department with additional Benadryl and Pepcid and IV fluids as well as nausea medicine.  Abdominal exam without focal tenderness.  She does not need advanced imaging of her abdomen at this time.  Likely ongoing allergic reaction.  Home with standard medications as prescribed earlier by the preceding physician.  Patient encouraged to return to the emergency department for new or worsening symptoms  Final Clinical Impressions(s) / ED  Diagnoses   Final diagnoses:  Allergic reaction, initial encounter    ED Discharge Orders    None       Jola Schmidt, MD 12/25/18 [phone]

## 2019-07-05 ENCOUNTER — Other Ambulatory Visit: Payer: Self-pay

## 2019-07-05 ENCOUNTER — Emergency Department (HOSPITAL_BASED_OUTPATIENT_CLINIC_OR_DEPARTMENT_OTHER)
Admission: EM | Admit: 2019-07-05 | Discharge: 2019-07-05 | Disposition: A | Discharge status: Home / Self Care | Payer: Medicaid Other | Attending: Emergency Medicine | Admitting: Emergency Medicine

## 2019-07-05 ENCOUNTER — Encounter (HOSPITAL_BASED_OUTPATIENT_CLINIC_OR_DEPARTMENT_OTHER): Payer: Self-pay | Admitting: Emergency Medicine

## 2019-07-05 DIAGNOSIS — H6690 Otitis media, unspecified, unspecified ear: Secondary | ICD-10-CM

## 2019-07-05 DIAGNOSIS — H6691 Otitis media, unspecified, right ear: Secondary | ICD-10-CM | POA: Insufficient documentation

## 2019-07-05 DIAGNOSIS — H9209 Otalgia, unspecified ear: Secondary | ICD-10-CM | POA: Diagnosis present

## 2019-07-05 DIAGNOSIS — Z87891 Personal history of nicotine dependence: Secondary | ICD-10-CM | POA: Insufficient documentation

## 2019-07-05 MED ORDER — AMOXICILLIN 500 MG PO CAPS: 500.0000 mg | ORAL_CAPSULE | Freq: Three times a day (TID) | ORAL | 0 refills | Status: AC

## 2019-07-05 NOTE — Discharge Instructions (Addendum)
Take medications as prescribed.  Return for new or worsening symptoms. 

## 2019-07-05 NOTE — ED Provider Notes (Signed)
[facility] DEPARTMENT Provider Note   CSN: [id number] Arrival date & time: 07/05/19  1250  History   Chief Complaint Chief Complaint  Patient presents with  . Otalgia   HPI Tiffany Christensen is a 38 y.o. female with past medical history significant for gastric ulcer, migraine who presents for evaluation of ear pain.  Patient states she has had ear pain x2 days.  Has been using her son's eardrops for otitis externa which has not relieved her pain.  She rates her current pain a 3/10.  She denies headache, lateral weakness, slurred speech, facial droop, neck pain, neck stiffness, drainage from her ear, congestion, rhinorrhea, sore throat, chest pain, shortness of breath, fever or chills.  No nausea or vomiting.  Has not taken anything p.o. for her pain. Denies additional aggravating or alleviating factors. No recent swimming or pool activities.   History obtained from patient and past medical records. No interpretor was used.     HPI  Past Medical History:  Diagnosis Date  . Barrett's esophagus   . Gastric ulcer   . Migraine   . Ovarian cyst   . Uterine fibroid     Patient Active Problem List   Diagnosis Date Noted  . 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 Menorrhagia 12/08/2016  . Anemia due to chronic blood loss 12/08/2016  . 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 Left sided abdominal pain   . Iron deficiency anemia due to chronic blood loss 12/05/2016    Past Surgical History:  Procedure Laterality Date  . ABDOMINAL HYSTERECTOMY    . BREAST LUMPECTOMY    . BREAST SURGERY    . CARPAL TUNNEL RELEASE    . ESOPHAGOGASTRODUODENOSCOPY N/A 12/07/2016   Procedure: ESOPHAGOGASTRODUODENOSCOPY (EGD);  Surgeon: Doran Stabler, MD;  Location: [facility] ENDOSCOPY;  Service: Endoscopy;  Laterality: N/A;  . HAND SURGERY Right   . hemmorhoidectomy    . HEMORRHOID SURGERY    . HERNIA REPAIR       OB History   No obstetric history on  file.      Home Medications    Prior to Admission medications   Medication Sig Start Date End Date Taking? Authorizing Provider  amitriptyline (ELAVIL) 50 MG tablet Take 50 mg by mouth at bedtime.    [provider]  amoxicillin (AMOXIL) 500 MG capsule Take 1 capsule (500 mg total) by mouth 3 (three) times daily for 7 days. 07/05/19 07/12/19  Maybel Dambrosio A, PA-C  famotidine (PEPCID) 20 MG tablet Take 1 tablet (20 mg total) by mouth 2 (two) times daily for 7 days. 12/24/18 12/31/18  Tegeler, Gwenyth Allegra, MD  ibuprofen (ADVIL,MOTRIN) 600 MG tablet Take 1 tablet (600 mg total) by mouth every 6 (six) hours as needed. 08/10/18   Horton, Barbette Hair, MD  methocarbamol (ROBAXIN) 500 MG tablet Take 1 tablet (500 mg total) by mouth every 8 (eight) hours as needed for muscle spasms. 12/16/17   Petrucelli, Samantha R, PA-C  pantoprazole (PROTONIX) 40 MG tablet Two (2) times a day. 12/11/16   [provider]  predniSONE (DELTASONE) 50 MG tablet Take 1 tablet (50 mg total) by mouth daily. 12/25/18   Tegeler, Gwenyth Allegra, MD    Family History Family History  Problem Relation Age of Onset  . Hypertension Maternal Grandmother     Social History Social History   Tobacco Use  . Smoking status: Former Smoker    Packs/day: 0.50    Types:  Cigarettes  . Smokeless tobacco: Never Used  . Tobacco comment: Quit x 2 weeks ago  Substance Use Topics  . Alcohol use: No  . Drug use: No     Allergies   Patient has no known allergies.   Review of Systems Review of Systems  Constitutional: Negative.   HENT: Positive for ear pain. Negative for congestion, ear discharge, facial swelling, hearing loss, postnasal drip, rhinorrhea, sinus pressure, sinus pain, sneezing, sore throat, trouble swallowing and voice change.   Eyes: Negative.   Respiratory: Negative.   Gastrointestinal: Negative.   Musculoskeletal: Negative for neck pain and neck stiffness.  Skin: Negative.   Neurological:  Negative.   All other systems reviewed and are negative.    Physical Exam Updated Vital Signs BP 118/76 (BP Location: Left Arm)   Pulse 89   Temp 98.7 F (37.1 C) (Oral)   Resp 16   Ht 5\' 5"  (1.651 m)   Wt 58.1 kg   LMP 11/08/2016 (Exact Date) Comment: neg upreg  SpO2 100%   BMI 21.30 kg/m   Physical Exam Vitals signs and nursing note reviewed.  Constitutional:      General: She is not in acute distress.    Appearance: She is not ill-appearing, toxic-appearing or diaphoretic.  HENT:     Head: Normocephalic and atraumatic.     Right Ear: Ear canal and external ear normal. There is no impacted cerumen. No hemotympanum. Tympanic membrane is erythematous and bulging. Tympanic membrane is not injected, scarred, perforated or retracted.     Left Ear: Tympanic membrane, ear canal and external ear normal. There is no impacted cerumen. No hemotympanum. Tympanic membrane is not injected, scarred, perforated, erythematous, retracted or bulging.     Ears:     Comments: No Mastoid tenderness.    Nose:     Comments: Clear rhinorrhea and congestion to bilateral nares.  No sinus tenderness.    Mouth/Throat:     Comments: Posterior oropharynx clear.  Mucous membranes moist.  Tonsils without erythema or exudate.  Uvula midline without deviation.  No evidence of PTA or RPA.  No drooling, dysphasia or trismus.  Phonation normal. Neck:     Musculoskeletal: Full passive range of motion without pain and normal range of motion.     Trachea: Trachea and phonation normal.     Comments: No Neck stiffness or neck rigidity.  No cervical lymphadenopathy. Cardiovascular:     Comments: No murmurs rubs or gallops. Pulmonary:     Comments: Clear to auscultation bilaterally without wheeze, rhonchi or rales.  No accessory muscle usage.  Able speak in full sentences. Abdominal:     Comments: Soft, nontender without rebound or guarding.  No CVA tenderness.  Musculoskeletal:     Comments: Moves all 4  extremities without difficulty.  Lower extremities without edema, erythema or warmth.  Skin:    Comments: Brisk capillary refill.  No rashes or lesions.  Neurological:     Mental Status: She is alert.     Comments: Ambulatory in department without difficulty.  Cranial nerves II through XII grossly intact.  No facial droop.  No aphasia.    ED Treatments / Results  Labs (all labs ordered are listed, but only abnormal results are displayed) Labs Reviewed - No data to display  EKG None  Radiology No results found.  Procedures Procedures (including critical care time)  Medications Ordered in ED Medications - No data to display   Initial Impression / Assessment and Plan / ED Course  I have reviewed the triage vital signs and the nursing notes.  Pertinent labs & imaging results that were available during my care of the patient were reviewed by me and considered in my medical decision making (see chart for details).  38 year old female appears otherwise well presents for 2 days of right ear pain.  Afebrile, nonseptic, non-ill-appearing.  TM with erythema and bulging to right.  Left TM normal.  External auditory canals clear.  No tenderness to tragus or retraction of pinna.  Patient presents with otalgia and exam consistent with acute otitis media. No concern for acute mastoiditis, meningitis, dissection, CVA.  No antibiotic use in the last month.  Patient discharged home with Amoxicillin.   Advised patient to call pcp today for follow-up.  I have also discussed reasons to return immediately to the ER.  Parent expresses understanding and agrees with plan.  The patient has been appropriately medically screened and/or stabilized in the ED. I have low suspicion for any other emergent medical condition which would require further screening, evaluation or treatment in the ED or require inpatient management.  Patient is hemodynamically stable and in no acute distress.  Patient able to ambulate  in department prior to ED.  Evaluation does not show acute pathology that would require ongoing or additional emergent interventions while in the emergency department or further inpatient treatment.  I have discussed the diagnosis with the patient and answered all questions.  Pain is been managed while in the emergency department and patient has no further complaints prior to discharge.  Patient is comfortable with plan discussed in room and is stable for discharge at this time.  I have discussed strict return precautions for returning to the emergency department.  Patient was encouraged to follow-up with PCP/specialist refer to at discharge.     Final Clinical Impressions(s) / ED Diagnoses   Final diagnoses:  Acute otitis media, unspecified otitis media type    ED Discharge Orders         Ordered    amoxicillin (AMOXIL) 500 MG capsule  3 times daily     07/05/19 1333           Everardo Voris A, PA-C 07/05/19 1341    Blanchie Dessert, MD 07/06/19 2146

## 2019-07-05 NOTE — ED Triage Notes (Signed)
Two days of right ear pain

## 2019-10-12 ENCOUNTER — Emergency Department (HOSPITAL_BASED_OUTPATIENT_CLINIC_OR_DEPARTMENT_OTHER): Payer: Medicaid Other

## 2019-10-12 ENCOUNTER — Emergency Department (HOSPITAL_BASED_OUTPATIENT_CLINIC_OR_DEPARTMENT_OTHER)
Admission: EM | Admit: 2019-10-12 | Discharge: 2019-10-12 | Disposition: A | Discharge status: Home / Self Care | Payer: Medicaid Other | Attending: Emergency Medicine | Admitting: Emergency Medicine

## 2019-10-12 ENCOUNTER — Encounter (HOSPITAL_BASED_OUTPATIENT_CLINIC_OR_DEPARTMENT_OTHER): Payer: Self-pay | Admitting: *Deleted

## 2019-10-12 ENCOUNTER — Other Ambulatory Visit: Payer: Self-pay

## 2019-10-12 DIAGNOSIS — G43919 Migraine, unspecified, intractable, without status migrainosus: Secondary | ICD-10-CM | POA: Insufficient documentation

## 2019-10-12 DIAGNOSIS — Z87891 Personal history of nicotine dependence: Secondary | ICD-10-CM | POA: Insufficient documentation

## 2019-10-12 DIAGNOSIS — R0789 Other chest pain: Secondary | ICD-10-CM | POA: Diagnosis not present

## 2019-10-12 DIAGNOSIS — Z79899 Other long term (current) drug therapy: Secondary | ICD-10-CM | POA: Diagnosis not present

## 2019-10-12 DIAGNOSIS — R519 Headache, unspecified: Secondary | ICD-10-CM

## 2019-10-12 LAB — COMPREHENSIVE METABOLIC PANEL
ALT: 12 U/L (ref 0–44)
AST: 12 U/L — ABNORMAL LOW (ref 15–41)
Albumin: 3.7 g/dL (ref 3.5–5.0)
Alkaline Phosphatase: 100 U/L (ref 38–126)
Anion gap: 9 (ref 5–15)
BUN: 10 mg/dL (ref 6–20)
CO2: 21 mmol/L — ABNORMAL LOW (ref 22–32)
Calcium: 8.9 mg/dL (ref 8.9–10.3)
Chloride: 108 mmol/L (ref 98–111)
Creatinine, Ser: 0.56 mg/dL (ref 0.44–1.00)
GFR calc Af Amer: >60 mL/min (ref >60)
GFR calc non Af Amer: >60 mL/min (ref >60)
Glucose, Bld: 92 mg/dL (ref 70–99)
Potassium: 3.8 mmol/L (ref 3.5–5.1)
Sodium: 138 mmol/L (ref 135–145)
Total Bilirubin: 0.5 mg/dL (ref 0.3–1.2)
Total Protein: 6.6 g/dL (ref 6.5–8.1)

## 2019-10-12 LAB — CBC WITH DIFFERENTIAL/PLATELET
Abs Immature Granulocytes: 0.04 10*3/uL (ref 0.00–0.07)
Basophils Absolute: 0 10*3/uL (ref 0.0–0.1)
Basophils Relative: 0 %
Eosinophils Absolute: 0.1 10*3/uL (ref 0.0–0.5)
Eosinophils Relative: 1 %
HCT: 32.7 % — ABNORMAL LOW (ref 36.0–46.0)
Hemoglobin: 10.9 g/dL — ABNORMAL LOW (ref 12.0–15.0)
Immature Granulocytes: 0 %
Lymphocytes Relative: 20 %
Lymphs Abs: 2 10*3/uL (ref 0.7–4.0)
MCH: 28.5 pg (ref 26.0–34.0)
MCHC: 33.3 g/dL (ref 30.0–36.0)
MCV: 85.6 fL (ref 80.0–100.0)
Monocytes Absolute: 0.6 10*3/uL (ref 0.1–1.0)
Monocytes Relative: 6 %
Neutro Abs: 7.2 10*3/uL (ref 1.7–7.7)
Neutrophils Relative %: 73 %
Platelets: 276 10*3/uL (ref 150–400)
RBC: 3.82 MIL/uL — ABNORMAL LOW (ref 3.87–5.11)
RDW: 13.2 % (ref 11.5–15.5)
WBC: 9.9 10*3/uL (ref 4.0–10.5)
nRBC: 0 % (ref 0.0–0.2)

## 2019-10-12 LAB — TROPONIN I (HIGH SENSITIVITY): Troponin I (High Sensitivity): <2 ng/L (ref <18)

## 2019-10-12 MED ORDER — IOHEXOL 350 MG/ML SOLN
100.0000 mL | Freq: Once | INTRAVENOUS | Status: AC | PRN
  Administered 2019-10-12: 22:00:00 100 mL via INTRAVENOUS

## 2019-10-12 MED ORDER — HYDROMORPHONE HCL 1 MG/ML IJ SOLN
1.0000 mg | Freq: Once | INTRAMUSCULAR | Status: AC
  Administered 2019-10-12: 1 mg via INTRAMUSCULAR
  Filled 2019-10-12: qty 1

## 2019-10-12 MED ORDER — SODIUM CHLORIDE 0.9 % IV SOLN
INTRAVENOUS | Status: DC
  Administered 2019-10-12: 21:00:00 via INTRAVENOUS

## 2019-10-12 NOTE — Discharge Instructions (Signed)
Follow-up as per social worker information provided.  Work-up here tonight without any significant findings for the cause of the chest pain.  Also CT head was negative.  Labs without significant abnormalities no evidence of any significant heart problem.

## 2019-10-12 NOTE — ED Notes (Signed)
Patient transported to CT 
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
## 2019-10-12 NOTE — ED Notes (Signed)
PT requesting pain medicine for head. Message sent to MD.
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
## 2019-10-13 ENCOUNTER — Other Ambulatory Visit: Payer: Self-pay

## 2019-10-13 ENCOUNTER — Emergency Department (HOSPITAL_BASED_OUTPATIENT_CLINIC_OR_DEPARTMENT_OTHER)
Admission: EM | Admit: 2019-10-13 | Discharge: 2019-10-13 | Disposition: A | Discharge status: Home / Self Care | Payer: Medicaid Other | Attending: Emergency Medicine | Admitting: Emergency Medicine

## 2019-10-13 DIAGNOSIS — Z87891 Personal history of nicotine dependence: Secondary | ICD-10-CM | POA: Insufficient documentation

## 2019-10-13 DIAGNOSIS — Z3202 Encounter for pregnancy test, result negative: Secondary | ICD-10-CM | POA: Insufficient documentation

## 2019-10-13 DIAGNOSIS — R519 Headache, unspecified: Secondary | ICD-10-CM | POA: Diagnosis present

## 2019-10-13 DIAGNOSIS — Z79899 Other long term (current) drug therapy: Secondary | ICD-10-CM | POA: Insufficient documentation

## 2019-10-13 DIAGNOSIS — G43901 Migraine, unspecified, not intractable, with status migrainosus: Secondary | ICD-10-CM | POA: Insufficient documentation

## 2019-10-13 LAB — PREGNANCY, URINE: Preg Test, Ur: NEGATIVE

## 2019-10-13 MED ORDER — DEXAMETHASONE SODIUM PHOSPHATE 10 MG/ML IJ SOLN
10.0000 mg | Freq: Once | INTRAMUSCULAR | Status: AC
  Administered 2019-10-13: 11:00:00 10 mg via INTRAVENOUS
  Filled 2019-10-13: qty 1

## 2019-10-13 MED ORDER — PROCHLORPERAZINE EDISYLATE 10 MG/2ML IJ SOLN
10.0000 mg | Freq: Once | INTRAMUSCULAR | Status: AC
  Administered 2019-10-13: 10 mg via INTRAVENOUS
  Filled 2019-10-13: qty 2

## 2019-10-13 MED ORDER — SODIUM CHLORIDE 0.9 % IV SOLN
INTRAVENOUS | Status: DC | PRN
  Administered 2019-10-13: 11:00:00 250 mL via INTRAVENOUS

## 2019-10-13 MED ORDER — KETOROLAC TROMETHAMINE 30 MG/ML IJ SOLN
30.0000 mg | Freq: Once | INTRAMUSCULAR | Status: AC
  Administered 2019-10-13: 11:00:00 30 mg via INTRAVENOUS
  Filled 2019-10-13: qty 1

## 2019-10-13 MED ORDER — DIPHENHYDRAMINE HCL 50 MG/ML IJ SOLN
25.0000 mg | Freq: Once | INTRAMUSCULAR | Status: AC
  Administered 2019-10-13: 25 mg via INTRAVENOUS
  Filled 2019-10-13: qty 1
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
## 2019-10-13 NOTE — ED Triage Notes (Signed)
Pt c/o headache which has not improved since last week.  Pt states she received dilaudid yesterday and it got worse.  Some sensitivity to light and sound, some nausea.

## 2019-10-28 ENCOUNTER — Emergency Department (HOSPITAL_BASED_OUTPATIENT_CLINIC_OR_DEPARTMENT_OTHER)
Admission: EM | Admit: 2019-10-28 | Discharge: 2019-10-29 | Disposition: A | Discharge status: Home / Self Care | Payer: Medicaid Other | Attending: Emergency Medicine | Admitting: Emergency Medicine

## 2019-10-28 ENCOUNTER — Other Ambulatory Visit: Payer: Self-pay

## 2019-10-28 ENCOUNTER — Emergency Department (HOSPITAL_BASED_OUTPATIENT_CLINIC_OR_DEPARTMENT_OTHER): Payer: Medicaid Other

## 2019-10-28 ENCOUNTER — Encounter (HOSPITAL_BASED_OUTPATIENT_CLINIC_OR_DEPARTMENT_OTHER): Payer: Self-pay | Admitting: *Deleted

## 2019-10-28 DIAGNOSIS — R05 Cough: Secondary | ICD-10-CM | POA: Insufficient documentation

## 2019-10-28 DIAGNOSIS — B349 Viral infection, unspecified: Secondary | ICD-10-CM | POA: Diagnosis not present

## 2019-10-28 DIAGNOSIS — R059 Cough, unspecified: Secondary | ICD-10-CM

## 2019-10-28 DIAGNOSIS — Z79899 Other long term (current) drug therapy: Secondary | ICD-10-CM | POA: Insufficient documentation

## 2019-10-28 DIAGNOSIS — Z20828 Contact with and (suspected) exposure to other viral communicable diseases: Secondary | ICD-10-CM | POA: Diagnosis not present

## 2019-10-28 DIAGNOSIS — Z87891 Personal history of nicotine dependence: Secondary | ICD-10-CM | POA: Diagnosis not present

## 2019-10-28 DIAGNOSIS — R0789 Other chest pain: Secondary | ICD-10-CM | POA: Diagnosis present

## 2019-10-28 LAB — CBC WITH DIFFERENTIAL/PLATELET
Abs Immature Granulocytes: 0.02 10*3/uL (ref 0.00–0.07)
Basophils Absolute: 0 10*3/uL (ref 0.0–0.1)
Basophils Relative: 1 %
Eosinophils Absolute: 0.1 10*3/uL (ref 0.0–0.5)
Eosinophils Relative: 2 %
HCT: 34.2 % — ABNORMAL LOW (ref 36.0–46.0)
Hemoglobin: 11.2 g/dL — ABNORMAL LOW (ref 12.0–15.0)
Immature Granulocytes: 0 %
Lymphocytes Relative: 48 %
Lymphs Abs: 2.9 10*3/uL (ref 0.7–4.0)
MCH: 28.4 pg (ref 26.0–34.0)
MCHC: 32.7 g/dL (ref 30.0–36.0)
MCV: 86.8 fL (ref 80.0–100.0)
Monocytes Absolute: 0.5 10*3/uL (ref 0.1–1.0)
Monocytes Relative: 9 %
Neutro Abs: 2.4 10*3/uL (ref 1.7–7.7)
Neutrophils Relative %: 40 %
Platelets: 227 10*3/uL (ref 150–400)
RBC: 3.94 MIL/uL (ref 3.87–5.11)
RDW: 13.2 % (ref 11.5–15.5)
WBC: 6 10*3/uL (ref 4.0–10.5)
nRBC: 0 % (ref 0.0–0.2)

## 2019-10-28 NOTE — ED Provider Notes (Signed)
[facility] EMERGENC[facility] DEPARTMENT Provider Note   CSN: [id number] Arrival date & time: 10/28/19  1929     History   Chief Complaint Chief Complaint  Patient presents with   Chest Pain   Cough    HPI Tiffany Christensen is a 38 y.o. female who presents to the ED today complaining of gradual onset, constant, substernal chest pain x 3-4 days.  She reports that her pain is worse with deep inspiration as well as movement.  She reports that she has also been experiencing subjective fever, chills, body aches.  She reports that she works at [employer] and individuals on the other side of the [place] recently tested positive for [employer].  Patient reports that they were told that it was cleared to go back to work and so patient has been going to work despite feeling bad.  She reports that when she went into work today they told her to come to the ED for further evaluation.  Patient states that she has been taking ibuprofen and Tylenol for her chest pain without relief.  She does report history of chest pain in the past most recently about 2 to 3 weeks ago where she was seen in the ED but states they could not figure out what was going on.  States that this chest pain feels different.  Is adamant that she had not been coughing prior to her chest pain starting. No hx DVT/PE. No recent prolonged travel or immobilization. No hemoptysis. No active malignancy. No exogenous hormone use.        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 ENDOSCOPY;  Service: Endoscopy;  Laterality: N/A;   HAND SURGERY Right    hemmorhoidectomy     HEMORRHOID SURGERY     HERNIA REPAIR       OB History   No obstetric history on file.      Home Medications    Prior to Admission medications   Medication Sig Start Date End Date Taking? Authorizing Provider  amitriptyline (ELAVIL) 50 MG tablet Take 50 mg by mouth at bedtime.    [provider]  famotidine (PEPCID) 20 MG tablet Take 1 tablet (20 mg total) by mouth 2 (two) times daily for 7 days. 12/24/18 12/31/18  Tegeler, Gwenyth Allegra, MD  ibuprofen (ADVIL,MOTRIN) 600 MG tablet Take 1 tablet (600 mg total) by mouth every 6 (six) hours as needed. 08/10/18   Horton, Barbette Hair, MD  methocarbamol (ROBAXIN) 500 MG tablet Take 1 tablet (500 mg total) by mouth every 8 (eight) hours as needed for muscle spasms. 12/16/17   Petrucelli, Samantha R, PA-C  pantoprazole (PROTONIX) 40 MG tablet Two (2) times a day.  12/11/16   [provider]  predniSONE (DELTASONE) 50 MG tablet Take 1 tablet (50 mg total) by mouth daily. 12/25/18   Tegeler, Gwenyth Allegra, MD    Family History Family History  Problem Relation Age of Onset   Hypertension Maternal Grandmother     Social History Social History   Tobacco Use   Smoking status: Former Smoker    Packs/day: 0.50    Types: Cigarettes   Smokeless tobacco: Never Used   Tobacco comment: Quit x 2 weeks ago  Substance Use Topics   Alcohol use: No   Drug use: No     Allergies   Patient has no active allergies.   Review of Systems Review of Systems  Constitutional: Positive for chills and fever (subjective).  HENT: Negative for congestion.   Eyes: Negative for visual  disturbance.  Respiratory: Positive for cough.   Cardiovascular: Positive for chest pain. Negative for palpitations and leg swelling.  Gastrointestinal: Negative for abdominal pain, nausea and vomiting.  Genitourinary: Negative for difficulty urinating.  Musculoskeletal: Positive for myalgias. Negative for neck pain and neck stiffness.  Skin: Negative for rash.  Neurological: Positive for headaches.     Physical Exam Updated Vital Signs BP 116/67    Pulse 88    Temp 98.7 F (37.1 C)    Resp 16    Ht 5\' 5"  (1.651 m)    Wt 58 kg    LMP 11/08/2016 (Exact Date) Comment: neg upreg   BMI 21.28 kg/m   Physical Exam Vitals signs and nursing note reviewed.  Constitutional:      Appearance: She is not ill-appearing or diaphoretic.  HENT:     Head: Normocephalic and atraumatic.  Eyes:     Conjunctiva/sclera: Conjunctivae normal.  Neck:     Musculoskeletal: Neck supple.  Cardiovascular:     Rate and Rhythm: Normal rate and regular rhythm.     Pulses:          Radial pulses are 2+ on the right side and 2+ on the left side.       Dorsalis pedis pulses are 2+ on the right side and 2+ on the left side.     Heart sounds: Normal heart sounds.  Pulmonary:     Effort: Pulmonary effort is normal.     Breath sounds: Decreased breath sounds present. No wheezing, rhonchi or rales.  Chest:     Chest wall: Tenderness present.  Abdominal:     Palpations: Abdomen is soft.     Tenderness: There is no abdominal tenderness.  Skin:    General: Skin is warm and dry.  Neurological:     Mental Status: She is alert.      ED Treatments / Results  Labs (all labs ordered are listed, but only abnormal results are displayed) Labs Reviewed  NOVEL CORONAVIRUS, NAA (HOSP ORDER, SEND-OUT TO REF LAB; TAT 18-24 HRS)  BASIC METABOLIC PANEL  CBC WITH DIFFERENTIAL/PLATELET  D-DIMER, QUANTITATIVE (NOT AT [facility])  TROPONIN I (HIGH SENSITIVITY)    EKG EKG Interpretation  Date/Time:  Wednesday October 28 2019 22:11:10 EST Ventricular Rate:  65 PR Interval:    QRS Duration: 81 QT Interval:  389 QTC Calculation: 405 R Axis:   63 Text Interpretation: Sinus rhythm Confirmed by Fredia Sorrow [phone]) on 10/28/2019 10:14:46 PM   Radiology Dg Chest Port 1 View  Result Date: 10/28/2019 CLINICAL DATA:  Chest pain EXAM: PORTABLE CHEST 1 VIEW COMPARISON:  10/12/2019 FINDINGS: Subsegmental atelectasis right base. No consolidation  or effusion. Normal heart size. No pneumothorax IMPRESSION: No active disease.  Subsegmental atelectasis right base Electronically Signed   By: Donavan Foil M.D.   On: 10/28/2019 22:07    Procedures Procedures (including critical care time)  Medications Ordered in ED Medications - No data to display   Initial Impression / Assessment and Plan / ED Course  I have reviewed the triage vital signs and the nursing notes.  Pertinent labs & imaging results that were available during my care of the patient were reviewed by me and considered in my medical decision making (see chart for details).    38 year old female who presents to the ED today complaining of chest pain that began 3 to 4 days ago.  Patient also complains of a cough subjective fever, chills, body aches, headache.  Patient is adamant that her chest pain began prior to her coughing.  She describes it as pleuritic in nature.  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  On exam patient appears ill but is nontoxic appearing.  Her vitals are stable today.  She is afebrile without tachycardia or tachypnea.  She is satting 100% on room air.  She is able to speak in full sentence without difficulty but actively coughing in the room.  Patient likely with COVID-19, will swab for this at this time.  Given pleuritic chest pain and patient being adamant that it began prior to coughing will need to check troponin and D-dimer at this time as well as chest x-ray and EKG.  Very much  doubt ACS or PE today cannot fully rule out.   X-ray with some atelectasis but otherwise clear.  EKG without ischemic changes today.  Awaiting lab work at this time.  If all reassuring patient to be discharged home.  With instructions to self isolate until she receives her test results.  If positive she will need to stay at home for 14 days. Pt is in agreement with plan at this time.   11:03 PM At shift change case signed out to Dr. Florina Ou who will dispo patient accordingly after labwork returns.   This note was prepared using Dragon voice recognition software and may include unintentional dictation errors due to the inherent limitations of voice recognition software.  Tiffany Christensen was evaluated in Emergency Department on 10/28/2019 for the symptoms described in the history of present illness. She was evaluated in the context of the global COVID-19 pandemic, which necessitated consideration that the patient might be at risk for infection with the SARS-CoV-2 virus that causes COVID-19. Institutional protocols and algorithms that pertain to the evaluation of patients at risk for COVID-19 are in a state of rapid change based on information released by regulatory bodies including the CDC and federal and state organizations. These policies and algorithms were followed during the patient's care in the ED.        Final Clinical Impressions(s) / ED Diagnoses   Final diagnoses:  Viral illness  Cough    ED Discharge Orders    None       Eustaquio Maize, PA-C 10/28/19 2311    Fredia Sorrow, MD 11/01/19 [phone]

## 2019-10-28 NOTE — Discharge Instructions (Signed)
We have swabbed you for covid today - stay home and self isolate until you receive your results. If negative you may return to work immediately. If positive you need to stay home and self isolate for 2 weeks (cleared 11/12/2019).      Person Under Monitoring Name: Tiffany Christensen  Location: [place] [address]   Infection Prevention Recommendations for Individuals Confirmed to have, or Being Evaluated for, 2019 Novel Coronavirus (COVID-19) Infection Who Receive Care at Home  Individuals who are confirmed to have, or are being evaluated for, COVID-19 should follow the prevention steps below until a healthcare provider or local or state health department says they can return to normal activities.  Stay home except to get medical care You should restrict activities outside your home, except for getting medical care. Do not go to work, school, or public areas, and do not use public transportation or taxis.  Call ahead before visiting your doctor Before your medical appointment, call the healthcare provider and tell them that you have, or are being evaluated for, COVID-19 infection. This will help the healthcare providers office take steps to keep other people from getting infected. Ask your healthcare provider to call the local or state health department.  Monitor your symptoms Seek prompt medical attention if your illness is worsening (e.g., difficulty breathing). Before going to your medical appointment, call the healthcare provider and tell them that you have, or are being evaluated for, COVID-19 infection. Ask your healthcare provider to call the local or state health department.  Wear a facemask You should wear a facemask that covers your nose and mouth when you are in the same room with other people and when you visit a healthcare provider. People who live with or visit you should also wear a facemask while they are in the same room with you.  Separate  yourself from other people in your home As much as possible, you should stay in a different room from other people in your home. Also, you should use a separate bathroom, if available.  Avoid sharing household items You should not share dishes, drinking glasses, cups, eating utensils, towels, bedding, or other items with other people in your home. After using these items, you should wash them thoroughly with soap and water.  Cover your coughs and sneezes Cover your mouth and nose with a tissue when you cough or sneeze, or you can cough or sneeze into your sleeve. Throw used tissues in a lined trash can, and immediately wash your hands with soap and water for at least 20 seconds or use an alcohol-based hand rub.  Wash your [facility] your hands often and thoroughly with soap and water for at least 20 seconds. You can use an alcohol-based hand sanitizer if soap and water are not available and if your hands are not visibly dirty. Avoid touching your eyes, nose, and mouth with unwashed hands.   Prevention Steps for Caregivers and Household Members of Individuals Confirmed to have, or Being Evaluated for, COVID-19 Infection Being Cared for in the Home  If you live with, or provide care at home for, a person confirmed to have, or being evaluated for, COVID-19 infection please follow these guidelines to prevent infection:  Follow healthcare providers instructions Make sure that you understand and can help the patient follow any healthcare provider instructions for all care.  Provide for the patients basic needs You should help the patient with basic needs in the home and provide support  for getting groceries, prescriptions, and other personal needs.  Monitor the patients symptoms If they are getting sicker, call his or her medical provider and tell them that the patient has, or is being evaluated for, COVID-19 infection. This will help the healthcare providers office take steps to keep  other people from getting infected. Ask the healthcare provider to call the local or state health department.  Limit the number of people who have contact with the patient If possible, have only one caregiver for the patient. Other household members should stay in another home or place of residence. If this is not possible, they should stay in another room, or be separated from the patient as much as possible. Use a separate bathroom, if available. Restrict visitors who do not have an essential need to be in the home.  Keep older adults, very young children, and other sick people away from the patient Keep older adults, very young children, and those who have compromised immune systems or chronic health conditions away from the patient. This includes people with chronic heart, lung, or kidney conditions, diabetes, and cancer.  Ensure good ventilation Make sure that shared spaces in the home have good air flow, such as from an air conditioner or an opened window, weather permitting.  Wash your hands often Wash your hands often and thoroughly with soap and water for at least 20 seconds. You can use an alcohol based hand sanitizer if soap and water are not available and if your hands are not visibly dirty. Avoid touching your eyes, nose, and mouth with unwashed hands. Use disposable paper towels to dry your hands. If not available, use dedicated cloth towels and replace them when they become wet.  Wear a facemask and gloves Wear a disposable facemask at all times in the room and gloves when you touch or have contact with the patients blood, body fluids, and/or secretions or excretions, such as sweat, saliva, sputum, nasal mucus, vomit, urine, or feces.  Ensure the mask fits over your nose and mouth tightly, and do not touch it during use. Throw out disposable facemasks and gloves after using them. Do not reuse. Wash your hands immediately after removing your facemask and gloves. If your  personal clothing becomes contaminated, carefully remove clothing and launder. Wash your hands after handling contaminated clothing. Place all used disposable facemasks, gloves, and other waste in a lined container before disposing them with other household waste. Remove gloves and wash your hands immediately after handling these items.  Do not share dishes, glasses, or other household items with the patient Avoid sharing household items. You should not share dishes, drinking glasses, cups, eating utensils, towels, bedding, or other items with a patient who is confirmed to have, or being evaluated for, COVID-19 infection. After the person uses these items, you should wash them thoroughly with soap and water.  Wash laundry thoroughly Immediately remove and wash clothes or bedding that have blood, body fluids, and/or secretions or excretions, such as sweat, saliva, sputum, nasal mucus, vomit, urine, or feces, on them. Wear gloves when handling laundry from the patient. Read and follow directions on labels of laundry or clothing items and detergent. In general, wash and dry with the warmest temperatures recommended on the label.  Clean all areas the individual has used often Clean all touchable surfaces, such as counters, tabletops, doorknobs, bathroom fixtures, toilets, phones, keyboards, tablets, and bedside tables, every day. Also, clean any surfaces that may have blood, body fluids, and/or secretions or excretions on  them. Wear gloves when cleaning surfaces the patient has come in contact with. Use a diluted bleach solution (e.g., dilute bleach with 1 part bleach and 10 parts water) or a household disinfectant with a label that says EPA-registered for coronaviruses. To make a bleach solution at home, add 1 tablespoon of bleach to 1 quart (4 cups) of water. For a larger supply, add  cup of bleach to 1 gallon (16 cups) of water. Read labels of cleaning products and follow recommendations provided on  product labels. Labels contain instructions for safe and effective use of the cleaning product including precautions you should take when applying the product, such as wearing gloves or eye protection and making sure you have good ventilation during use of the product. Remove gloves and wash hands immediately after cleaning.  Monitor yourself for signs and symptoms of illness Caregivers and household members are considered close contacts, should monitor their health, and will be asked to limit movement outside of the home to the extent possible. Follow the monitoring steps for close contacts listed on the symptom monitoring form.   ? If you have additional questions, contact your local health department or call the epidemiologist on call at [phone] (available 24/7). ? This guidance is subject to change. For the most up-to-date guidance from [facility], please refer to their website: [email or web address]

## 2019-10-28 NOTE — ED Triage Notes (Signed)
Pt c/o cough , chills , fever  And body aches , ha x 2 days. Exposed  To covid at work

## 2019-10-28 NOTE — Progress Notes (Signed)
RT unable to obtain IV access with IV ultrasound.

## 2019-10-29 ENCOUNTER — Emergency Department (HOSPITAL_BASED_OUTPATIENT_CLINIC_OR_DEPARTMENT_OTHER): Payer: Medicaid Other

## 2019-10-29 LAB — BASIC METABOLIC PANEL
Anion gap: 6 (ref 5–15)
BUN: 8 mg/dL (ref 6–20)
CO2: 21 mmol/L — ABNORMAL LOW (ref 22–32)
Calcium: 8.6 mg/dL — ABNORMAL LOW (ref 8.9–10.3)
Chloride: 108 mmol/L (ref 98–111)
Creatinine, Ser: 0.52 mg/dL (ref 0.44–1.00)
GFR calc Af Amer: >60 mL/min (ref >60)
GFR calc non Af Amer: >60 mL/min (ref >60)
Glucose, Bld: 92 mg/dL (ref 70–99)
Potassium: 4.1 mmol/L (ref 3.5–5.1)
Sodium: 135 mmol/L (ref 135–145)

## 2019-10-29 LAB — D-DIMER, QUANTITATIVE: D-Dimer, Quant: 0.81 ug/mL-FEU — ABNORMAL HIGH (ref 0.00–0.50)

## 2019-10-29 LAB — TROPONIN I (HIGH SENSITIVITY): Troponin I (High Sensitivity): <2 ng/L (ref <18)

## 2019-10-29 MED ORDER — IOHEXOL 350 MG/ML SOLN
100.0000 mL | Freq: Once | INTRAVENOUS | Status: AC | PRN
  Administered 2019-10-29: 100 mL via INTRAVENOUS

## 2019-10-29 MED ORDER — HYDROCOD POLST-CPM POLST ER 10-8 MG/5ML PO SUER: 5.0000 mL | Freq: Two times a day (BID) | ORAL | 0 refills | Status: DC | PRN

## 2019-10-29 MED FILL — HYDROCODONE-CHLORPHEN ER SU: 10-8 | 7 days supply | Qty: 70 | Fill #0

## 2019-10-30 LAB — NOVEL CORONAVIRUS, NAA (HOSP ORDER, SEND-OUT TO REF LAB; TAT 18-24 HRS): SARS-CoV-2, NAA: NOT DETECTED

## 2020-05-13 ENCOUNTER — Other Ambulatory Visit: Payer: Self-pay

## 2020-05-13 ENCOUNTER — Emergency Department (HOSPITAL_BASED_OUTPATIENT_CLINIC_OR_DEPARTMENT_OTHER)
Admission: EM | Admit: 2020-05-13 | Discharge: 2020-05-13 | Disposition: A | Discharge status: Home / Self Care | Payer: Commercial Managed Care - PPO | Attending: Emergency Medicine | Admitting: Emergency Medicine

## 2020-05-13 ENCOUNTER — Encounter (HOSPITAL_BASED_OUTPATIENT_CLINIC_OR_DEPARTMENT_OTHER): Payer: Self-pay | Admitting: Emergency Medicine

## 2020-05-13 DIAGNOSIS — M549 Dorsalgia, unspecified: Secondary | ICD-10-CM | POA: Insufficient documentation

## 2020-05-13 DIAGNOSIS — Y998 Other external cause status: Secondary | ICD-10-CM | POA: Insufficient documentation

## 2020-05-13 DIAGNOSIS — Y9389 Activity, other specified: Secondary | ICD-10-CM | POA: Diagnosis not present

## 2020-05-13 DIAGNOSIS — Y92410 Unspecified street and highway as the place of occurrence of the external cause: Secondary | ICD-10-CM | POA: Diagnosis not present

## 2020-05-13 DIAGNOSIS — F17210 Nicotine dependence, cigarettes, uncomplicated: Secondary | ICD-10-CM | POA: Insufficient documentation

## 2020-05-13 DIAGNOSIS — Z79899 Other long term (current) drug therapy: Secondary | ICD-10-CM | POA: Diagnosis not present

## 2020-05-13 DIAGNOSIS — T148XXA Other injury of unspecified body region, initial encounter: Secondary | ICD-10-CM

## 2020-05-13 MED ORDER — IBUPROFEN 800 MG PO TABS: 800.0000 mg | ORAL_TABLET | Freq: Three times a day (TID) | ORAL | 0 refills | Status: DC | PRN

## 2020-05-13 MED ORDER — METHOCARBAMOL 500 MG PO TABS: 500.0000 mg | ORAL_TABLET | Freq: Three times a day (TID) | ORAL | 0 refills | Status: DC | PRN

## 2020-05-13 MED ORDER — KETOROLAC TROMETHAMINE 60 MG/2ML IM SOLN
60.0000 mg | Freq: Once | INTRAMUSCULAR | Status: AC
  Administered 2020-05-13: 60 mg via INTRAMUSCULAR
  Filled 2020-05-13: qty 2
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
## 2020-05-13 NOTE — Discharge Instructions (Signed)
You may alternate Tylenol 1000 mg every 6 hours as needed for pain and Ibuprofen 800 mg every 8 hours as needed for pain.  Please take Ibuprofen with food.  Do not take more than 4000 mg of Tylenol (acetaminophen) in a 24 hour period.  
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
## 2020-06-01 ENCOUNTER — Emergency Department (HOSPITAL_BASED_OUTPATIENT_CLINIC_OR_DEPARTMENT_OTHER): Payer: Commercial Managed Care - PPO

## 2020-06-01 ENCOUNTER — Encounter (HOSPITAL_BASED_OUTPATIENT_CLINIC_OR_DEPARTMENT_OTHER): Payer: Self-pay | Admitting: Emergency Medicine

## 2020-06-01 ENCOUNTER — Other Ambulatory Visit: Payer: Self-pay

## 2020-06-01 DIAGNOSIS — Y999 Unspecified external cause status: Secondary | ICD-10-CM | POA: Diagnosis not present

## 2020-06-01 DIAGNOSIS — Y990 Civilian activity done for income or pay: Secondary | ICD-10-CM | POA: Insufficient documentation

## 2020-06-01 DIAGNOSIS — S67192A Crushing injury of right middle finger, initial encounter: Secondary | ICD-10-CM | POA: Diagnosis not present

## 2020-06-01 DIAGNOSIS — Y93C2 Activity, hand held interactive electronic device: Secondary | ICD-10-CM | POA: Diagnosis not present

## 2020-06-01 DIAGNOSIS — W231XXA Caught, crushed, jammed, or pinched between stationary objects, initial encounter: Secondary | ICD-10-CM | POA: Diagnosis not present

## 2020-06-01 DIAGNOSIS — F17210 Nicotine dependence, cigarettes, uncomplicated: Secondary | ICD-10-CM | POA: Insufficient documentation

## 2020-06-01 DIAGNOSIS — Y9269 Other specified industrial and construction area as the place of occurrence of the external cause: Secondary | ICD-10-CM | POA: Insufficient documentation

## 2020-06-01 DIAGNOSIS — S6991XA Unspecified injury of right wrist, hand and finger(s), initial encounter: Secondary | ICD-10-CM | POA: Diagnosis present

## 2020-06-01 NOTE — ED Triage Notes (Signed)
Patient arrived via POV c/o right middle finger crush injury. Patient states middle finger crushed between two metal plates. Patient is AO x 4, VS WDL, Normal gait.

## 2020-06-02 ENCOUNTER — Emergency Department (HOSPITAL_BASED_OUTPATIENT_CLINIC_OR_DEPARTMENT_OTHER)
Admission: EM | Admit: 2020-06-02 | Discharge: 2020-06-02 | Disposition: A | Discharge status: Home / Self Care | Payer: Commercial Managed Care - PPO | Attending: Emergency Medicine | Admitting: Emergency Medicine

## 2020-06-02 DIAGNOSIS — S6710XA Crushing injury of unspecified finger(s), initial encounter: Secondary | ICD-10-CM

## 2020-06-02 MED ORDER — NAPROXEN 250 MG PO TABS
500.0000 mg | ORAL_TABLET | Freq: Once | ORAL | Status: AC
  Administered 2020-06-02: 500 mg via ORAL
  Filled 2020-06-02: qty 2

## 2020-06-02 MED ORDER — NAPROXEN 375 MG PO TABS: ORAL_TABLET | ORAL | 0 refills | Status: DC

## 2020-06-02 NOTE — ED Provider Notes (Signed)
[place] [facility] Provider Note: [name], MD, FACEP  CSN: [id number] MRN: [id number] ARRIVAL: 06/01/20 at [place]: [place]  Finger Injury   HISTORY OF PRESENT ILLNESS  06/02/20 4:14 AM Tiffany Christensen is a 39 y.o. female who got her right middle finger crushed in a picture frame making machine at work yesterday evening about 915.  She has a superficial laceration to the dorsal aspect of that finger and an ecchymosis to the volar aspect.  She is having pain in that finger which she rates as a 6 out of 10, worse with palpation or attempted movement.  She states she is unable to bend the DIP joint.  She describes the pain as throbbing.  Her tetanus is up-to-date.   Past Medical History:  Diagnosis Date  . Barrett's esophagus   . Gastric ulcer   . Migraine   . Ovarian cyst   . Uterine fibroid     Past Surgical History:  Procedure Laterality Date  . ABDOMINAL HYSTERECTOMY    . BREAST LUMPECTOMY    . BREAST SURGERY    . CARPAL TUNNEL RELEASE    . ESOPHAGOGASTRODUODENOSCOPY N/A 12/07/2016   Procedure: ESOPHAGOGASTRODUODENOSCOPY (EGD);  Surgeon: Doran Stabler, MD;  Location: [facility] ENDOSCOPY;  Service: Endoscopy;  Laterality: N/A;  . HAND SURGERY Right   . hemmorhoidectomy    . HEMORRHOID SURGERY    . HERNIA REPAIR      Family History  Problem Relation Age of Onset  . Hypertension Maternal Grandmother   . Cancer Sister     Social History   Tobacco Use  . Smoking status: Current Every Day Smoker    Packs/day: 0.50    Types: Cigarettes  . Smokeless tobacco: Never Used  . Tobacco comment: Quit x 2 weeks ago  Vaping Use  . Vaping Use: Never used  Substance Use Topics  . Alcohol use: No  . Drug use: No    Prior to Admission medications   Medication Sig Start Date End Date Taking? Authorizing Provider  amitriptyline (ELAVIL) 50 MG tablet Take 50 mg by mouth at bedtime.    [provider]  famotidine (PEPCID) 20 MG tablet  Take 1 tablet (20 mg total) by mouth 2 (two) times daily for 7 days. 12/24/18 12/31/18  Tegeler, Gwenyth Allegra, MD  pantoprazole (PROTONIX) 40 MG tablet Two (2) times a day. 12/11/16   [provider]    Allergies Patient has no known allergies.   REVIEW OF SYSTEMS  Negative except as noted here or in the History of Present Illness.   PHYSICAL EXAMINATION  Initial Vital Signs Blood pressure (!) 113/93, pulse 72, temperature 98.6 F (37 C), temperature source Oral, resp. rate 16, height 5\' 5"  (1.651 m), weight 59 kg, last menstrual period 11/08/2016, SpO2 100 %.  Examination General: Well-developed, well-nourished female in no acute distress; appearance consistent with age of record HENT: normocephalic; atraumatic Eyes: Normal appearance Neck: supple Heart: regular rate and rhythm Lungs: clear to auscultation bilaterally Abdomen: soft; nondistended; nontender; bowel sounds present Extremities: No deformity; tenderness and swelling of right middle finger with superficial dorsal laceration on volar ecchymosis, distal capillary refill intact, patient able to flex or extend DIP joint:        Neurologic: Awake, alert and oriented; motor function intact in all extremities and symmetric; no facial droop Skin: Warm and dry Psychiatric: Normal mood and affect   RESULTS  Summary of this visit's results, reviewed and interpreted  by myself:   EKG Interpretation  Date/Time:    Ventricular Rate:    PR Interval:    QRS Duration:   QT Interval:    QTC Calculation:   R Axis:     Text Interpretation:        Laboratory Studies: No results found for this or any previous visit (from the past 24 hour(s)). Imaging Studies: DG Finger Middle Right  Result Date: 06/01/2020 CLINICAL DATA:  Crushing injury EXAM: RIGHT MIDDLE FINGER 2+V COMPARISON:  None. FINDINGS: Soft tissue irregularity along the dorsal aspect of the distal interphalangeal joint. Additional disruption of the nail  plate. Remaining soft tissues are unremarkable. Overlying bandaging is present. No acute bony abnormality. Specifically, no fracture, subluxation, or dislocation. IMPRESSION: 1. Soft tissue irregularity along the dorsal aspect of the distal interphalangeal joint with disruption of the nail plate. Correlate with exam findings. 2. No acute osseous abnormality. Electronically Signed   By: Lovena Le M.D.   On: 06/01/2020 23:33    ED COURSE and MDM  Nursing notes, initial and subsequent vitals signs, including pulse oximetry, reviewed and interpreted by myself.  Vitals:   06/01/20 2311 06/01/20 2312  BP: (!) 113/93   Pulse: 72   Resp: 16   Temp: 98.6 F (37 C)   TempSrc: Oral   SpO2: 100%   Weight:  59 kg  Height:  5\' 5"  (1.651 m)   Medications  naproxen (NAPROSYN) tablet 500 mg (has no administration in time range)    Will splint finger and refer to hand surgery for evaluation after several days.  We cannot exclude an occult tendon injury.  PROCEDURES  Procedures   ED DIAGNOSES     ICD-10-CM   1. Crushing injury of finger, initial encounter  S67.10XA        Kaimen Peine, Jenny Reichmann, MD 06/02/20 [phone]

## 2020-06-18 ENCOUNTER — Emergency Department (HOSPITAL_BASED_OUTPATIENT_CLINIC_OR_DEPARTMENT_OTHER): Payer: Commercial Managed Care - PPO

## 2020-06-18 ENCOUNTER — Other Ambulatory Visit: Payer: Self-pay

## 2020-06-18 ENCOUNTER — Encounter (HOSPITAL_BASED_OUTPATIENT_CLINIC_OR_DEPARTMENT_OTHER): Payer: Self-pay | Admitting: Emergency Medicine

## 2020-06-18 ENCOUNTER — Emergency Department (HOSPITAL_BASED_OUTPATIENT_CLINIC_OR_DEPARTMENT_OTHER)
Admission: EM | Admit: 2020-06-18 | Discharge: 2020-06-19 | Disposition: A | Discharge status: Home / Self Care | Payer: Commercial Managed Care - PPO | Attending: Emergency Medicine | Admitting: Emergency Medicine

## 2020-06-18 DIAGNOSIS — I10 Essential (primary) hypertension: Secondary | ICD-10-CM | POA: Insufficient documentation

## 2020-06-18 DIAGNOSIS — F17210 Nicotine dependence, cigarettes, uncomplicated: Secondary | ICD-10-CM | POA: Insufficient documentation

## 2020-06-18 DIAGNOSIS — R0789 Other chest pain: Secondary | ICD-10-CM | POA: Insufficient documentation

## 2020-06-18 DIAGNOSIS — J939 Pneumothorax, unspecified: Secondary | ICD-10-CM

## 2020-06-18 DIAGNOSIS — M549 Dorsalgia, unspecified: Secondary | ICD-10-CM | POA: Insufficient documentation

## 2020-06-18 DIAGNOSIS — M25511 Pain in right shoulder: Secondary | ICD-10-CM | POA: Diagnosis present

## 2020-06-18 HISTORY — DX: Essential (primary) hypertension: I10

## 2020-06-18 LAB — CBC WITH DIFFERENTIAL/PLATELET
Abs Immature Granulocytes: 0.03 10*3/uL (ref 0.00–0.07)
Basophils Absolute: 0.1 10*3/uL (ref 0.0–0.1)
Basophils Relative: 1 %
Eosinophils Absolute: 0.1 10*3/uL (ref 0.0–0.5)
Eosinophils Relative: 1 %
HCT: 34.4 % — ABNORMAL LOW (ref 36.0–46.0)
Hemoglobin: 11.6 g/dL — ABNORMAL LOW (ref 12.0–15.0)
Immature Granulocytes: 0 %
Lymphocytes Relative: 32 %
Lymphs Abs: 2.8 10*3/uL (ref 0.7–4.0)
MCH: 28.6 pg (ref 26.0–34.0)
MCHC: 33.7 g/dL (ref 30.0–36.0)
MCV: 84.9 fL (ref 80.0–100.0)
Monocytes Absolute: 0.8 10*3/uL (ref 0.1–1.0)
Monocytes Relative: 10 %
Neutro Abs: 4.9 10*3/uL (ref 1.7–7.7)
Neutrophils Relative %: 56 %
Platelets: 332 10*3/uL (ref 150–400)
RBC: 4.05 MIL/uL (ref 3.87–5.11)
RDW: 13 % (ref 11.5–15.5)
WBC: 8.7 10*3/uL (ref 4.0–10.5)
nRBC: 0 % (ref 0.0–0.2)

## 2020-06-18 LAB — COMPREHENSIVE METABOLIC PANEL
ALT: 11 U/L (ref 0–44)
AST: 11 U/L — ABNORMAL LOW (ref 15–41)
Albumin: 4 g/dL (ref 3.5–5.0)
Alkaline Phosphatase: 114 U/L (ref 38–126)
Anion gap: 10 (ref 5–15)
BUN: 10 mg/dL (ref 6–20)
CO2: 25 mmol/L (ref 22–32)
Calcium: 9.1 mg/dL (ref 8.9–10.3)
Chloride: 106 mmol/L (ref 98–111)
Creatinine, Ser: 0.56 mg/dL (ref 0.44–1.00)
GFR calc Af Amer: >60 mL/min (ref >60)
GFR calc non Af Amer: >60 mL/min (ref >60)
Glucose, Bld: 102 mg/dL — ABNORMAL HIGH (ref 70–99)
Potassium: 3.4 mmol/L — ABNORMAL LOW (ref 3.5–5.1)
Sodium: 141 mmol/L (ref 135–145)
Total Bilirubin: 0.2 mg/dL — ABNORMAL LOW (ref 0.3–1.2)
Total Protein: 7.6 g/dL (ref 6.5–8.1)

## 2020-06-18 LAB — D-DIMER, QUANTITATIVE: D-Dimer, Quant: 1.12 ug/mL-FEU — ABNORMAL HIGH (ref 0.00–0.50)

## 2020-06-18 LAB — TROPONIN I (HIGH SENSITIVITY): Troponin I (High Sensitivity): 3 ng/L (ref <18)

## 2020-06-18 MED ORDER — MORPHINE SULFATE (PF) 4 MG/ML IV SOLN
4.0000 mg | Freq: Once | INTRAVENOUS | Status: AC
  Administered 2020-06-18: 4 mg via INTRAVENOUS
  Filled 2020-06-18: qty 1

## 2020-06-18 MED ORDER — SODIUM CHLORIDE 0.9 % IV BOLUS
500.0000 mL | Freq: Once | INTRAVENOUS | Status: AC
  Administered 2020-06-18: 500 mL via INTRAVENOUS

## 2020-06-18 NOTE — ED Provider Notes (Signed)
Emergency Department Provider Note   I have reviewed the triage vital signs and the nursing notes.   HISTORY  Chief Complaint Chest Pain   HPI Tiffany Christensen is a 39 y.o. female presents to the emergency department for evaluation of acute onset right shoulder and back pain which began yesterday evening.  Patient describes the pain as pressure with occasional sharp shooting discomfort from her right chest to her back.  Pain is severe intermittently and seems worse with touching or moving the area.  She also notes significant increase in pain with deep breathing and feels some shortness of breath.  She not experiencing fevers or shaking chills.  No cough.   Past Medical History:  Diagnosis Date  . Barrett's esophagus   . Gastric ulcer   . Hypertension   . Migraine   . Ovarian cyst   . Uterine fibroid     Patient Active Problem List   Diagnosis Date Noted  . 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 Menorrhagia 12/08/2016  . Anemia due to chronic blood loss 12/08/2016  . 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 Left sided abdominal pain   . Iron deficiency anemia due to chronic blood loss 12/05/2016    Past Surgical History:  Procedure Laterality Date  . ABDOMINAL HYSTERECTOMY    . BREAST LUMPECTOMY    . BREAST SURGERY    . CARPAL TUNNEL RELEASE    . ESOPHAGOGASTRODUODENOSCOPY N/A 12/07/2016   Procedure: ESOPHAGOGASTRODUODENOSCOPY (EGD);  Surgeon: Doran Stabler, MD;  Location: [facility] ENDOSCOPY;  Service: Endoscopy;  Laterality: N/A;  . HAND SURGERY Right   . hemmorhoidectomy    . HEMORRHOID SURGERY    . HERNIA REPAIR      Allergies Patient has no known allergies.  Family History  Problem Relation Age of Onset  . Hypertension Maternal Grandmother   . Cancer Sister     Social History Social History   Tobacco Use  . Smoking status: Current Every Day Smoker    Packs/day: 0.50    Types: Cigarettes  . Smokeless tobacco: Never Used    . Tobacco comment: Quit x 2 weeks ago  Vaping Use  . Vaping Use: Never used  Substance Use Topics  . Alcohol use: No  . Drug use: No    Review of Systems  Constitutional: No fever/chills Eyes: No visual changes. ENT: No sore throat. Cardiovascular: Positive chest pain. Respiratory: Positive shortness of breath. Gastrointestinal: No abdominal pain.  No nausea, no vomiting.  No diarrhea.  No constipation. Genitourinary: Negative for dysuria. Musculoskeletal: Negative for back pain. Skin: Negative for rash. Neurological: Negative for headaches, focal weakness or numbness.  10-point ROS otherwise negative.  ____________________________________________   PHYSICAL EXAM:  VITAL SIGNS: ED Triage Vitals  Enc Vitals Group     BP 06/18/20 2217 128/86     Pulse Rate 06/18/20 2217 (!) 104     Resp 06/18/20 2217 18     Temp 06/18/20 2217 98.4 F (36.9 C)     Temp Source 06/18/20 2217 Oral     SpO2 06/18/20 2217 98 %     Weight 06/18/20 2218 126 lb (57.2 kg)     Height 06/18/20 2218 5\' 5"  (1.651 m)   Constitutional: Alert and oriented. Intermittently shouting in pain.  Eyes: Conjunctivae are normal. Head: Atraumatic. Nose: No congestion/rhinnorhea. Mouth/Throat: Mucous membranes are moist. Neck: No stridor.   Cardiovascular: Tachycardia. Good peripheral circulation. Grossly normal heart sounds.   Respiratory: Normal  respiratory effort.  No retractions. Lungs CTAB. Gastrointestinal: Soft and nontender. No distention.  Musculoskeletal: Normal range of motion of the bilateral upper and lower extremities.  Tenderness over the right lateral chest wall which seems to reproduce pain but exam is not completely consistent. Neurologic:  Normal speech and language.  Skin:  Skin is warm, dry and intact. No rash noted.  ____________________________________________   LABS (all labs ordered are listed, but only abnormal results are displayed)  Labs Reviewed  COMPREHENSIVE METABOLIC  PANEL - Abnormal; Notable for the following components:      Result Value   Potassium 3.4 (*)    Glucose, Bld 102 (*)    AST 11 (*)    Total Bilirubin 0.2 (*)    All other components within normal limits  CBC WITH DIFFERENTIAL/PLATELET - Abnormal; Notable for the following components:   Hemoglobin 11.6 (*)    HCT 34.4 (*)    All other components within normal limits  D-DIMER, QUANTITATIVE (NOT AT [facility]) - Abnormal; Notable for the following components:   D-Dimer, Quant 1.12 (*)    All other components within normal limits  TROPONIN I (HIGH SENSITIVITY)  TROPONIN I (HIGH SENSITIVITY)   ____________________________________________  EKG   EKG Interpretation  Date/Time:  Saturday June 18 2020 22:19:04 EDT Ventricular Rate:  85 PR Interval:    QRS Duration: 83 QT Interval:  383 QTC Calculation: 456 R Axis:   67 Text Interpretation: Sinus rhythm Probable left atrial enlargement Abnormal R-wave progression, early transition Baseline wander in lead(s) I III aVL aVF No STEMI Confirmed by Nanda Quinton [phone]) on 06/18/2020 10:21:40 PM       ____________________________________________  RADIOLOGY  DG Chest Portable 1 View  Result Date: 06/18/2020 CLINICAL DATA:  Chest pain. EXAM: PORTABLE CHEST 1 VIEW COMPARISON:  October 28, 2019 FINDINGS: There are hazy airspace opacities at the lung bases bilaterally, right greater than left. There is no pneumothorax. No convincing pleural effusion. The heart size is stable from prior study. IMPRESSION: Hazy airspace opacities at the lung bases bilaterally, right greater than left, may represent atelectasis or developing infiltrates. Electronically Signed   By: Constance Holster M.D.   On: 06/18/2020 22:52    ____________________________________________   PROCEDURES  Procedure(s) performed:   Procedures  None  ____________________________________________   INITIAL IMPRESSION / ASSESSMENT AND PLAN / ED COURSE  Pertinent labs &  imaging results that were available during my care of the patient were reviewed by me and considered in my medical decision making (see chart for details).   Patient presents emerged department evaluation of chest discomfort which is intermittently severe and somewhat pleuritic.  Exam is somewhat inconsistent.  Patient does have mild tachycardia on arrival.  EKG reviewed no acute ischemic change.  Plan for screening labs including D-dimer, single troponin, chest x-ray.  If negative, plan for treatment of MSK related pain and discharge. Care transferred to Dr. Betsey Holiday pending labs.   CXR reviewed. No acute findings on portable study. Presentation is not consistent with PNA.  ____________________________________________  FINAL CLINICAL IMPRESSION(S) / ED DIAGNOSES  Chest pain  MEDICATIONS GIVEN DURING THIS VISIT:  Medications  morphine 4 MG/ML injection 4 mg (4 mg Intravenous Given 06/18/20 2248)  sodium chloride 0.9 % bolus 500 mL (0 mLs Intravenous Stopped 06/19/20 0000)  iohexol (OMNIPAQUE) 350 MG/ML injection 100 mL (100 mLs Intravenous Contrast Given 06/19/20 0031)  morphine 4 MG/ML injection 4 mg (4 mg Intravenous Given 06/19/20 0216)  methocarbamol (ROBAXIN) 1,000 mg in dextrose  5 % 100 mL IVPB (0 mg Intravenous Stopped 06/19/20 0622)     NEW OUTPATIENT MEDICATIONS STARTED DURING THIS VISIT:  Discharge Medication List as of 06/19/2020  5:54 AM    START taking these medications   Details  methocarbamol (ROBAXIN) 500 MG tablet Take 1 tablet (500 mg total) by mouth every 8 (eight) hours as needed for muscle spasms., Starting Sun 06/19/2020, Normal    oxyCODONE-acetaminophen (PERCOCET) 5-325 MG tablet Take 1 tablet by mouth every 4 (four) hours as needed., Starting Sun 06/19/2020, Normal        Note:  This document was prepared using Dragon voice recognition software and may include unintentional dictation errors.  Nanda Quinton, MD, [facility]    Yonael Tulloch, [facility],  MD 06/22/20 [phone]

## 2020-06-18 NOTE — ED Triage Notes (Signed)
Reports right sided chest pain since yesterday that radiates into the right shoulder and back.  Describes pressure and sharp.  Also endorses feels SOB when taking a deep breath.

## 2020-06-19 ENCOUNTER — Emergency Department (HOSPITAL_COMMUNITY)
Admission: EM | Admit: 2020-06-19 | Discharge: 2020-06-19 | Disposition: A | Discharge status: Home / Self Care | Payer: Commercial Managed Care - PPO | Source: Home / Self Care | Attending: Emergency Medicine | Admitting: Emergency Medicine

## 2020-06-19 ENCOUNTER — Emergency Department (HOSPITAL_COMMUNITY): Payer: Commercial Managed Care - PPO

## 2020-06-19 ENCOUNTER — Emergency Department (HOSPITAL_BASED_OUTPATIENT_CLINIC_OR_DEPARTMENT_OTHER): Payer: Commercial Managed Care - PPO

## 2020-06-19 ENCOUNTER — Other Ambulatory Visit: Payer: Self-pay

## 2020-06-19 ENCOUNTER — Encounter (HOSPITAL_COMMUNITY): Payer: Self-pay | Admitting: Student

## 2020-06-19 DIAGNOSIS — R0789 Other chest pain: Secondary | ICD-10-CM | POA: Insufficient documentation

## 2020-06-19 DIAGNOSIS — Z79899 Other long term (current) drug therapy: Secondary | ICD-10-CM | POA: Insufficient documentation

## 2020-06-19 DIAGNOSIS — I10 Essential (primary) hypertension: Secondary | ICD-10-CM | POA: Insufficient documentation

## 2020-06-19 DIAGNOSIS — F17210 Nicotine dependence, cigarettes, uncomplicated: Secondary | ICD-10-CM | POA: Insufficient documentation

## 2020-06-19 LAB — CBC WITH DIFFERENTIAL/PLATELET
Abs Immature Granulocytes: 0.01 10*3/uL (ref 0.00–0.07)
Basophils Absolute: 0 10*3/uL (ref 0.0–0.1)
Basophils Relative: 1 %
Eosinophils Absolute: 0.1 10*3/uL (ref 0.0–0.5)
Eosinophils Relative: 1 %
HCT: 29.5 % — ABNORMAL LOW (ref 36.0–46.0)
Hemoglobin: 10 g/dL — ABNORMAL LOW (ref 12.0–15.0)
Immature Granulocytes: 0 %
Lymphocytes Relative: 32 %
Lymphs Abs: 1.4 10*3/uL (ref 0.7–4.0)
MCH: 29 pg (ref 26.0–34.0)
MCHC: 33.9 g/dL (ref 30.0–36.0)
MCV: 85.5 fL (ref 80.0–100.0)
Monocytes Absolute: 0.5 10*3/uL (ref 0.1–1.0)
Monocytes Relative: 11 %
Neutro Abs: 2.5 10*3/uL (ref 1.7–7.7)
Neutrophils Relative %: 55 %
Platelets: 285 10*3/uL (ref 150–400)
RBC: 3.45 MIL/uL — ABNORMAL LOW (ref 3.87–5.11)
RDW: 13.2 % (ref 11.5–15.5)
WBC: 4.5 10*3/uL (ref 4.0–10.5)
nRBC: 0 % (ref 0.0–0.2)

## 2020-06-19 LAB — BASIC METABOLIC PANEL
Anion gap: 8 (ref 5–15)
BUN: 6 mg/dL (ref 6–20)
CO2: 21 mmol/L — ABNORMAL LOW (ref 22–32)
Calcium: 8.4 mg/dL — ABNORMAL LOW (ref 8.9–10.3)
Chloride: 109 mmol/L (ref 98–111)
Creatinine, Ser: 0.41 mg/dL — ABNORMAL LOW (ref 0.44–1.00)
GFR calc Af Amer: >60 mL/min (ref >60)
GFR calc non Af Amer: >60 mL/min (ref >60)
Glucose, Bld: 90 mg/dL (ref 70–99)
Potassium: 3.3 mmol/L — ABNORMAL LOW (ref 3.5–5.1)
Sodium: 138 mmol/L (ref 135–145)

## 2020-06-19 LAB — TROPONIN I (HIGH SENSITIVITY): Troponin I (High Sensitivity): <2 ng/L (ref <18)

## 2020-06-19 MED ORDER — NICOTINE 14 MG/24HR TD PT24
14.0000 mg | MEDICATED_PATCH | Freq: Once | TRANSDERMAL | Status: DC
  Administered 2020-06-19: 14 mg via TRANSDERMAL
  Filled 2020-06-19: qty 1

## 2020-06-19 MED ORDER — NICOTINE 21 MG/24HR TD PT24
21.0000 mg | MEDICATED_PATCH | Freq: Every day | TRANSDERMAL | 3 refills | Status: DC
Start: 2020-06-19 — End: 2020-11-23

## 2020-06-19 MED ORDER — MORPHINE SULFATE (PF) 4 MG/ML IV SOLN
4.0000 mg | Freq: Once | INTRAVENOUS | Status: AC
  Administered 2020-06-19: 4 mg via INTRAVENOUS
  Filled 2020-06-19: qty 1

## 2020-06-19 MED ORDER — ONDANSETRON HCL 4 MG/2ML IJ SOLN
4.0000 mg | Freq: Once | INTRAMUSCULAR | Status: AC
  Administered 2020-06-19: 4 mg via INTRAVENOUS
  Filled 2020-06-19: qty 2

## 2020-06-19 MED ORDER — METHOCARBAMOL 1000 MG/10ML IJ SOLN
1000.0000 mg | Freq: Once | INTRAVENOUS | Status: AC
  Administered 2020-06-19: 1000 mg via INTRAVENOUS
  Filled 2020-06-19: qty 10

## 2020-06-19 MED ORDER — SODIUM CHLORIDE 0.9 % IV BOLUS
500.0000 mL | Freq: Once | INTRAVENOUS | Status: AC
  Administered 2020-06-19: 500 mL via INTRAVENOUS

## 2020-06-19 MED ORDER — METHOCARBAMOL 500 MG PO TABS
500.0000 mg | ORAL_TABLET | Freq: Three times a day (TID) | ORAL | 0 refills | Status: DC | PRN
Start: 2020-06-19 — End: 2020-06-26

## 2020-06-19 MED ORDER — HYDROMORPHONE HCL 1 MG/ML IJ SOLN
1.0000 mg | Freq: Once | INTRAMUSCULAR | Status: AC
  Administered 2020-06-19: 1 mg via INTRAVENOUS
  Filled 2020-06-19: qty 1

## 2020-06-19 MED ORDER — SODIUM CHLORIDE 0.9 % IV SOLN
INTRAVENOUS | Status: DC | PRN
  Administered 2020-06-19: 500 mL via INTRAVENOUS

## 2020-06-19 MED ORDER — KETOROLAC TROMETHAMINE 30 MG/ML IJ SOLN
15.0000 mg | Freq: Once | INTRAMUSCULAR | Status: AC
  Administered 2020-06-19: 15 mg via INTRAVENOUS
  Filled 2020-06-19: qty 1

## 2020-06-19 MED ORDER — METHOCARBAMOL 1000 MG/10ML IJ SOLN
1000.0000 mg | Freq: Once | INTRAMUSCULAR | Status: DC
  Filled 2020-06-19: qty 10

## 2020-06-19 MED ORDER — HYDROMORPHONE HCL 1 MG/ML IJ SOLN
0.5000 mg | Freq: Once | INTRAMUSCULAR | Status: AC
  Administered 2020-06-19: 0.5 mg via INTRAVENOUS
  Filled 2020-06-19: qty 1

## 2020-06-19 MED ORDER — OXYCODONE-ACETAMINOPHEN 5-325 MG PO TABS
1.0000 | ORAL_TABLET | Freq: Once | ORAL | Status: AC
  Administered 2020-06-19: 1 via ORAL
  Filled 2020-06-19: qty 1

## 2020-06-19 MED ORDER — ACETAMINOPHEN 500 MG PO TABS
1000.0000 mg | ORAL_TABLET | Freq: Once | ORAL | Status: AC
  Administered 2020-06-19: 1000 mg via ORAL
  Filled 2020-06-19: qty 2

## 2020-06-19 MED ORDER — OXYCODONE-ACETAMINOPHEN 5-325 MG PO TABS: 1.0000 | ORAL_TABLET | ORAL | 0 refills | Status: DC | PRN

## 2020-06-19 MED ORDER — IOHEXOL 350 MG/ML SOLN
100.0000 mL | Freq: Once | INTRAVENOUS | Status: AC | PRN
  Administered 2020-06-19: 100 mL via INTRAVENOUS

## 2020-06-19 NOTE — ED Notes (Signed)
Heat packs and extra pillow given.

## 2020-06-19 NOTE — ED Notes (Signed)
Pt attempting to find a ride home.

## 2020-06-19 NOTE — Discharge Instructions (Signed)
If you have increasing pain or shortness of breath, return to the ER for repeat evaluation, or call 911.  Schedule follow-up with Dr. Kipp Brood to further evaluate your lung.

## 2020-06-19 NOTE — ED Notes (Signed)
Patient BIB [facility] from home with family.  Went to [facility] this morning and was told she had a right sided pneumothorax.  Patient was sent home with pain meds and muscle relaxer.  Patient was unable to get to a pharmacy to pick up those meds and is now in 10/10 pain in her right chest.  [facility] placed a 18g left FA.  EMS vitals were 116/76, 80-p, 16-r, and 97% on RA.

## 2020-06-19 NOTE — ED Notes (Signed)
Patient given graham crackers and Sprite

## 2020-06-19 NOTE — ED Provider Notes (Addendum)
Patient signed out to me by Dr. Laverta Baltimore to follow-up on CT scan.  Patient seen with sharp right-sided chest pains that seem very atypical for cardiac etiology.  She did have a D-dimer performed because of the pleuritic nature of her pain.  This was elevated and therefore CT angiography was performed.  This did not show evidence of PE but did reveal a small 10 to 15% pneumothorax.  This was not seen on plain film, likely because it was a portable x-ray.  Findings discussed with Dr. Kipp Brood, on-call for cardiothoracic surgery.  He does not recommend admission at this time.  He felt that if the chest x-ray was stable at 6 hours after initial imaging, patient could be discharged and be given follow-up instructions for worsening symptoms.  A formal PA and lateral chest x-ray was performed 6 hours after arrival.  There is a very small area of pneumothorax visible on this x-ray that correlates to the pneumothorax seen on CT.  This appears to be stable, not enlarging.   Orpah Greek, MD 06/19/20 [id number]    Orpah Greek, MD 06/19/20 [phone]

## 2020-06-19 NOTE — ED Provider Notes (Signed)
[facility] DEPT Provider Note   CSN: [id number] Arrival date & time: 06/19/20  1307     History Chief Complaint  Patient presents with  . Chest Injury    Tiffany Christensen is a 39 y.o. female.  HPI    Patient presents less than 24 hours after being seen at our affiliated facility following development of chest pain. Patient was diagnosed with a pneumothorax, seemingly spontaneous. She notes that since discharge, not 12 hours ago she has had new nausea, has had persistent pain, worse with motion, activity, breathing. No new fever, no other chest pain, no other complaints. Patient has not yet obtained her medication from the pharmacy.  Pain is 10/10, right-sided, sharp. Past Medical History:  Diagnosis Date  . Barrett's esophagus   . Gastric ulcer   . Hypertension   . Migraine   . Ovarian cyst   . Uterine fibroid     Patient Active Problem List   Diagnosis Date Noted  . 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 Menorrhagia 12/08/2016  . Anemia due to chronic blood loss 12/08/2016  . 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 Left sided abdominal pain   . Iron deficiency anemia due to chronic blood loss 12/05/2016    Past Surgical History:  Procedure Laterality Date  . ABDOMINAL HYSTERECTOMY    . BREAST LUMPECTOMY    . BREAST SURGERY    . CARPAL TUNNEL RELEASE    . ESOPHAGOGASTRODUODENOSCOPY N/A 12/07/2016   Procedure: ESOPHAGOGASTRODUODENOSCOPY (EGD);  Surgeon: Doran Stabler, MD;  Location: [facility] ENDOSCOPY;  Service: Endoscopy;  Laterality: N/A;  . HAND SURGERY Right   . hemmorhoidectomy    . HEMORRHOID SURGERY    . HERNIA REPAIR       OB History   No obstetric history on file.     Family History  Problem Relation Age of Onset  . Hypertension Maternal Grandmother   . Cancer Sister     Social History   Tobacco Use  . Smoking status: Current Every Day Smoker    Packs/day: 0.50    Types:  Cigarettes  . Smokeless tobacco: Never Used  . Tobacco comment: Quit x 2 weeks ago  Vaping Use  . Vaping Use: Never used  Substance Use Topics  . Alcohol use: No  . Drug use: No    Home Medications Prior to Admission medications   Medication Sig Start Date End Date Taking? Authorizing Provider  amitriptyline (ELAVIL) 50 MG tablet Take 50 mg by mouth at bedtime.    [provider]  famotidine (PEPCID) 20 MG tablet Take 1 tablet (20 mg total) by mouth 2 (two) times daily for 7 days. 12/24/18 12/31/18  Tegeler, Gwenyth Allegra, MD  methocarbamol (ROBAXIN) 500 MG tablet Take 1 tablet (500 mg total) by mouth every 8 (eight) hours as needed for muscle spasms. 06/19/20   Orpah Greek, MD  naproxen (NAPROSYN) 375 MG tablet Take 1 tablet twice daily as needed for pain. 06/02/20   Molpus, John, MD  nicotine (NICODERM CQ) 21 mg/24hr patch Place 1 patch (21 mg total) onto the skin daily. 06/19/20   Orpah Greek, MD  oxyCODONE-acetaminophen (PERCOCET) 5-325 MG tablet Take 1 tablet by mouth every 4 (four) hours as needed. 06/19/20   Orpah Greek, MD  pantoprazole (PROTONIX) 40 MG tablet Two (2) times a day. 12/11/16   [provider]    Allergies    Patient has no known allergies.  Review of Systems   Review of Systems  Constitutional:       Per HPI, otherwise negative  HENT:       Per HPI, otherwise negative  Respiratory:       Per HPI, otherwise negative  Cardiovascular:       Per HPI, otherwise negative  Gastrointestinal: Negative for vomiting.  Endocrine:       Negative aside from HPI  Genitourinary:       Neg aside from HPI   Musculoskeletal:       Per HPI, otherwise negative  Skin: Negative.   Neurological: Negative for syncope.    Physical Exam Updated Vital Signs Ht 5\' 5"  (1.651 m)   Wt 58 kg   LMP 11/08/2016 (Exact Date) Comment: neg upreg  BMI 21.28 kg/m   Physical Exam Vitals and nursing note reviewed.  Constitutional:       Appearance: She is well-developed.     Comments: Uncomfortable appearing thin adult female expressing pain with any motion.  HENT:     Head: Normocephalic and atraumatic.  Eyes:     Conjunctiva/sclera: Conjunctivae normal.  Cardiovascular:     Rate and Rhythm: Normal rate and regular rhythm.  Pulmonary:     Effort: Pulmonary effort is normal. No respiratory distress.     Breath sounds: Normal breath sounds. No stridor.  Abdominal:     General: There is no distension.  Skin:    General: Skin is warm and dry.  Neurological:     Mental Status: She is alert and oriented to person, place, and time.     Cranial Nerves: No cranial nerve deficit.      ED Results / Procedures / Treatments   Labs (all labs ordered are listed, but only abnormal results are displayed) Labs Reviewed  CBC WITH DIFFERENTIAL/PLATELET - Abnormal; Notable for the following components:      Result Value   RBC 3.45 (*)    Hemoglobin 10.0 (*)    HCT 29.5 (*)    All other components within normal limits  BASIC METABOLIC PANEL    Radiology DG Chest 2 View  Result Date: 06/19/2020 CLINICAL DATA:  Pneumothorax EXAM: CHEST - 2 VIEW COMPARISON:  June 19, 2020 study obtained earlier in the day; June 19, 2020 CT angiogram chest FINDINGS: Small right anteroapical pneumothorax is stable compared to earlier in the day. No tension component. Ill-defined opacity in the right base may represent a small focus of pneumonia. Lungs elsewhere clear. Heart size and pulmonary vascularity are normal. No adenopathy. No bone lesions. IMPRESSION: Stable appearing anteroapical right pneumothorax. Area of suspected pneumonia right base. Lungs elsewhere clear. Cardiac silhouette normal. No adenopathy. No bone lesions. Electronically Signed   By: Lowella Grip III M.D.   On: 06/19/2020 14:06   DG Chest 2 View  Result Date: 06/19/2020 CLINICAL DATA:  Pneumothorax follow-up EXAM: CHEST - 2 VIEW COMPARISON:  06/19/2020 CTA chest FINDINGS:  There is a small right apical pneumothorax. Bibasilar atelectasis. No pleural effusion headache. Normal cardiomediastinal contours. IMPRESSION: Small right apical pneumothorax. Electronically Signed   By: Ulyses Jarred M.D.   On: 06/19/2020 05:40   CT ANGIO CHEST PE W OR WO CONTRAST  Addendum Date: 06/19/2020   ADDENDUM REPORT: 06/19/2020 01:12 ADDENDUM: Additional Impression Point: No evidence of acute pulmonary artery embolism. Electronically Signed   By: Lovena Le M.D.   On: 06/19/2020 01:12   Result Date: 06/19/2020 CLINICAL DATA:  Addendum pneumothorax did EXAM: CT ANGIOGRAPHY CHEST WITH  CONTRAST TECHNIQUE: Multidetector CT imaging of the chest was performed using the standard protocol during bolus administration of intravenous contrast. Multiplanar CT image reconstructions and MIPs were obtained to evaluate the vascular anatomy. CONTRAST:  148mL OMNIPAQUE IOHEXOL 350 MG/ML SOLN COMPARISON:  CT 10/29/2019 FINDINGS: Cardiovascular: Satisfactory opacification the pulmonary arteries to the segmental level. No pulmonary artery filling defects are identified. Central pulmonary arteries are normal caliber. The aorta is normal caliber. No acute luminal abnormality. No periaortic stranding or hemorrhage. Left vertebral artery arises directly from the aortic arch just proximal to the left subclavian artery origin. Proximal great vessels otherwise unremarkable. Normal heart size. No pericardial effusion. Mediastinum/Nodes: No mediastinal fluid or gas. Normal thyroid gland and thoracic inlet. No acute abnormality of the trachea or esophagus. No worrisome mediastinal, hilar or axillary adenopathy. Lungs/Pleura: There is a small right pneumothorax ,approximately 10-15% of the right chest cavity, tracking anteriorly and medially. No associated pleural effusion. Some dense bandlike opacities are present in the right middle lobe likely reflecting subsegmental atelectatic changes with more dependent atelectasis  posteriorly. Additional atelectatic changes present in the left lung. No other focal or consolidative opacity is seen. No left pneumothorax or effusion. Small medial air cyst/bulla present in the left lung base is unchanged from prior. Upper Abdomen: Stomach distended with ingested material. No other acute abnormality in the upper abdomen. Musculoskeletal: No visible displaced rib fractures. No acute traumatic findings of the chest wall. No other acute or suspicious osseous lesions. No worrisome chest wall lesions. Review of the MIP images confirms the above findings. IMPRESSION: 1. Small right pneumothorax occupying approximately 10-15% of the right chest cavity, tracking anteriorly and medially. No associated pleural effusion. Indeterminate etiology, potentially spontaneous 2. No visible displaced rib fractures or traumatic findings of the chest wall. 3. Bandlike opacities in the right middle lobe and right lower lobe likely reflecting subsegmental atelectatic changes with more dependent atelectasis posteriorly. These results were called by telephone at the time of interpretation on 06/19/2020 at 1:09 am to provider [facility] , who verbally acknowledged these results. Electronically Signed: By: Lovena Le M.D. On: 06/19/2020 01:10   DG Chest Portable 1 View  Result Date: 06/18/2020 CLINICAL DATA:  Chest pain. EXAM: PORTABLE CHEST 1 VIEW COMPARISON:  October 28, 2019 FINDINGS: There are hazy airspace opacities at the lung bases bilaterally, right greater than left. There is no pneumothorax. No convincing pleural effusion. The heart size is stable from prior study. IMPRESSION: Hazy airspace opacities at the lung bases bilaterally, right greater than left, may represent atelectasis or developing infiltrates. Electronically Signed   By: Constance Holster M.D.   On: 06/18/2020 22:52    Procedures Procedures (including critical care time)  Medications Ordered in ED Medications  nicotine  (NICODERM CQ - dosed in mg/24 hours) patch 14 mg (14 mg Transdermal Patch Applied 06/19/20 1925)  oxyCODONE-acetaminophen (PERCOCET/ROXICET) 5-325 MG per tablet 1 tablet (has no administration in time range)  HYDROmorphone (DILAUDID) injection 0.5 mg (has no administration in time range)  HYDROmorphone (DILAUDID) injection 0.5 mg (0.5 mg Intravenous Given 06/19/20 1407)  ketorolac (TORADOL) 30 MG/ML injection 15 mg (15 mg Intravenous Given 06/19/20 1407)  sodium chloride 0.9 % bolus 500 mL (0 mLs Intravenous Stopped 06/19/20 1524)  HYDROmorphone (DILAUDID) injection 1 mg (1 mg Intravenous Given 06/19/20 1521)  HYDROmorphone (DILAUDID) injection 1 mg (1 mg Intravenous Given 06/19/20 1824)  acetaminophen (TYLENOL) tablet 1,000 mg (1,000 mg Oral Given 06/19/20 1823)  ondansetron (ZOFRAN) injection 4 mg (4 mg Intravenous  Given 06/19/20 1842)    ED Course  I have reviewed the triage vital signs and the nursing notes.  Pertinent labs & imaging results that were available during my care of the patient were reviewed by me and considered in my medical decision making (see chart for details).   Young female with apparently spontaneous pneumothorax now presents with chest pain. Given her history, concern for progression of disease versus new pathology is considered, initial x-ray ordered, analgesia ordered.  On repeat exam patient is in similar condition x-ray reviewed, in contrast with studies from earlier in the 24-hour period, there are no notable changes, and patient has no new oxygen requirement.  Patient now notes that she was unable to obtain her prescribed narcotics, likely contributing to today's presentation.  Update:, I discussed the patient's case with our cardiothoracic colleagues given a week.  Presentation for chest pain secondary to pneumothorax.  On we discussed patient's absence of new oxygen requirement, need for follow-up with and our surgical colleague offers to follow-up with patient in the  clinic in the coming days.      6:51 PM Patient accompanied by her fianc. I discussed today's evaluation, and yesterday's CT, x-rays as well with both of these ladies. We discussed the absence of evidence for progression of disease, absence of hemodynamic instability, absence of hypoxia.  I relayed to them my conversations with her cardiothoracic colleagues, the patient is appropriate for outpatient follow-up in the clinic, may do so tomorrow or Tuesday. Patient requests nicotine patch.  Adult female presents with pain secondary to spontaneous pneumothorax. Patient is awake, alert, hemodynamically unremarkable.  Patient's presentation is seemingly in part due to inability to obtain her medication. Patient received multiple rounds of pain medication here, was discharged to follow-up in the clinic tomorrow.  Absent new oxygen requirement, hemodynamic instability, other complaints, evidence for distress, outpatient follow-up is reasonable.   MDM Number of Diagnoses or Management Options Atypical chest pain: established, worsening   Amount and/or Complexity of Data Reviewed Clinical lab tests: reviewed Tests in the radiology section of CPT: reviewed Tests in the medicine section of CPT: ordered Decide to obtain previous medical records or to obtain history from someone other than the patient: yes Obtain history from someone other than the patient: yes Review and summarize past medical records: yes Independent visualization of images, tracings, or specimens: yes  Risk of Complications, Morbidity, and/or Mortality Presenting problems: high Diagnostic procedures: high Management options: high  Critical Care Total time providing critical care: < 30 minutes  Patient Progress Patient progress: stable  Final Clinical Impression(s) / ED Diagnoses Final diagnoses:  Atypical chest pain     Carmin Muskrat, MD 06/19/20 1959

## 2020-06-19 NOTE — Discharge Instructions (Signed)
As discussed, today's evaluation has demonstrated no substantial change in the characteristics of your pneumothorax or collapsed lung. It is important he follow-up with our cardiothoracic surgery colleagues in their clinic.  Similarly important that you obtain your previously prescribed pain medication, use them as directed.  Return here for concerning changes in your condition.

## 2020-06-20 ENCOUNTER — Other Ambulatory Visit: Payer: Self-pay | Admitting: *Deleted

## 2020-06-20 DIAGNOSIS — J9383 Other pneumothorax: Secondary | ICD-10-CM

## 2020-06-21 ENCOUNTER — Other Ambulatory Visit: Payer: Self-pay

## 2020-06-21 ENCOUNTER — Ambulatory Visit
Admission: RE | Admit: 2020-06-21 | Discharge: 2020-06-21 | Disposition: A | Discharge status: Home / Self Care | Payer: Medicaid Other | Source: Ambulatory Visit | Attending: Thoracic Surgery (Cardiothoracic Vascular Surgery) | Admitting: Thoracic Surgery (Cardiothoracic Vascular Surgery)

## 2020-06-21 ENCOUNTER — Institutional Professional Consult (permissible substitution): Payer: Medicaid Other | Admitting: Thoracic Surgery (Cardiothoracic Vascular Surgery)

## 2020-06-21 ENCOUNTER — Encounter: Payer: Self-pay | Admitting: Thoracic Surgery (Cardiothoracic Vascular Surgery)

## 2020-06-21 VITALS — BP 124/84 | HR 77 | Temp 97.7°F | Resp 16 | Ht 65.0 in | Wt 127.0 lb

## 2020-06-21 DIAGNOSIS — J939 Pneumothorax, unspecified: Secondary | ICD-10-CM | POA: Diagnosis not present

## 2020-06-21 DIAGNOSIS — J930 Spontaneous tension pneumothorax: Secondary | ICD-10-CM

## 2020-06-21 DIAGNOSIS — J9383 Other pneumothorax: Secondary | ICD-10-CM

## 2020-06-21 NOTE — Progress Notes (Signed)
PCP is Patient, No Pcp Per Referring Provider is Lacretia Leigh, MD  Chief Complaint  Patient presents with  . Spontaneous Pneumothorax    RIGHT...consult with CXR, seen in the ED 06/19/20    HPI: Ms. Tiffany Christensen is sent for consultation regarding a right spontaneous pneumothorax.  Tiffany Christensen is a 39 year old woman with a past medical history significant for tobacco abuse, uterine fibroid, ovarian cyst, hypertension, Barrett's esophagus, gastric ulcer, and migraines.  She has a history of a hysterectomy and oophorectomy (unilateral per patient).  She presented to [facility] on 06/19/2020 with sudden onset of right-sided chest pain and shortness of breath.  She had a CT of the chest which showed a small right pneumothorax.  She was managed conservatively and findings were stable on a repeat chest x-ray at 6 hours.  She was discharged.  She says that she continues to have severe pain and muscle spasms.  Her shortness of breath has improved.  She has not smoked since the event on Sunday.  No prior history of spontaneous pneumothorax.  No antecedent trauma.   Past Medical History:  Diagnosis Date  . Barrett's esophagus   . Gastric ulcer   . Hypertension   . Migraine   . Ovarian cyst   . Uterine fibroid     Past Surgical History:  Procedure Laterality Date  . ABDOMINAL HYSTERECTOMY    . BREAST LUMPECTOMY    . BREAST SURGERY    . CARPAL TUNNEL RELEASE    . ESOPHAGOGASTRODUODENOSCOPY N/A 12/07/2016   Procedure: ESOPHAGOGASTRODUODENOSCOPY (EGD);  Surgeon: Doran Stabler, MD;  Location: [facility] ENDOSCOPY;  Service: Endoscopy;  Laterality: N/A;  . HAND SURGERY Right   . hemmorhoidectomy    . HEMORRHOID SURGERY    . HERNIA REPAIR      Family History  Problem Relation Age of Onset  . Hypertension Maternal Grandmother   . Cancer Sister     Social History Social History   Tobacco Use  . Smoking status: Current Every Day Smoker    Packs/day: 0.50    Types: Cigarettes  .  Smokeless tobacco: Never Used  . Tobacco comment: Quit x 2 weeks ago  Vaping Use  . Vaping Use: Never used  Substance Use Topics  . Alcohol use: No  . Drug use: No    Current Outpatient Medications  Medication Sig Dispense Refill  . amitriptyline (ELAVIL) 50 MG tablet Take 50 mg by mouth at bedtime.    . methocarbamol (ROBAXIN) 500 MG tablet Take 1 tablet (500 mg total) by mouth every 8 (eight) hours as needed for muscle spasms. 20 tablet 0  . nicotine (NICODERM CQ) 21 mg/24hr patch Place 1 patch (21 mg total) onto the skin daily. 28 patch 3  . ondansetron (ZOFRAN-ODT) 4 MG disintegrating tablet Take 4 mg by mouth every 8 (eight) hours as needed. (Patient not taking: Reported on 06/19/2020)    . pantoprazole (PROTONIX) 40 MG tablet Two (2) times a day. (Patient not taking: Reported on 06/19/2020)    . SUMAtriptan (IMITREX) 50 MG tablet Take 50 mg by mouth every 2 (two) hours as needed for migraine or headache.  (Patient not taking: Reported on 06/21/2020)    . traMADol (ULTRAM) 50 MG tablet Take 50 mg by mouth every 6 (six) hours as needed for moderate pain or severe pain.  (Patient not taking: Reported on 06/21/2020)    . zonisamide (ZONEGRAN) 100 MG capsule  (Patient not taking: Reported on 06/19/2020)  No current facility-administered medications for this visit.    No Known Allergies  Review of Systems  Respiratory: Positive for shortness of breath (Resolved).   Cardiovascular: Positive for chest pain ("Muscle spasms").    BP 124/84 (BP Location: Left Arm, Patient Position: Sitting, Cuff Size: Normal)   Pulse 77   Temp 97.7 F (36.5 C)   Resp 16   Ht 5\' 5"  (1.651 m)   Wt 127 lb (57.6 kg)   LMP 11/08/2016 (Exact Date) Comment: neg upreg  SpO2 99% Comment: RA  BMI 21.13 kg/m  Physical Exam Constitutional:      Appearance: Normal appearance.     Comments: Anxious  HENT:     Head: Normocephalic and atraumatic.  Eyes:     General: No scleral icterus.    Extraocular  Movements: Extraocular movements intact.  Neck:     Comments: Trachea midline Cardiovascular:     Rate and Rhythm: Normal rate and regular rhythm.     Pulses: Normal pulses.     Heart sounds: Normal heart sounds. No murmur heard.   Pulmonary:     Effort: Pulmonary effort is normal. No respiratory distress.     Breath sounds: Normal breath sounds. No wheezing or rales.     Comments: Diffuse chest wall tenderness Abdominal:     General: There is no distension.     Palpations: Abdomen is soft.     Tenderness: There is no abdominal tenderness.  Musculoskeletal:     Cervical back: Neck supple.  Lymphadenopathy:     Cervical: No cervical adenopathy.  Skin:    General: Skin is warm and dry.  Neurological:     General: No focal deficit present.     Mental Status: She is alert and oriented to person, place, and time.     Cranial Nerves: No cranial nerve deficit.     Motor: No weakness.    Diagnostic Tests: CHEST - 2 VIEW  COMPARISON:  Radiograph and CT 06/19/2020  FINDINGS: Decreased size of right pneumothorax from exam 2 days ago. Trace apical component persists visualized under the right second rib. Subsegmental atelectasis in the right lung base, left lower lobe and left midlung zone. The heart is normal in size. Unchanged mediastinal contours. No pleural effusion. No acute osseous abnormalities are seen.  IMPRESSION: Decreased size of right pneumothorax from exam 2 days ago. Trace apical component persists.  Scattered atelectasis.   Electronically Signed   By: Keith Rake M.D.   On: 06/21/2020 15:25 ADDENDUM: Additional Impression Point:  No evidence of acute pulmonary artery embolism.   Electronically Signed   By: Lovena Le M.D.   On: 06/19/2020 01:12   Signed by Briant Cedar, MD on 06/19/2020 1:15 AM  Narrative & Impression  CLINICAL DATA:  Addendum pneumothorax did  EXAM: CT ANGIOGRAPHY CHEST WITH  CONTRAST  TECHNIQUE: Multidetector CT imaging of the chest was performed using the standard protocol during bolus administration of intravenous contrast. Multiplanar CT image reconstructions and MIPs were obtained to evaluate the vascular anatomy.  CONTRAST:  140mL OMNIPAQUE IOHEXOL 350 MG/ML SOLN  COMPARISON:  CT 10/29/2019  FINDINGS: Cardiovascular: Satisfactory opacification the pulmonary arteries to the segmental level. No pulmonary artery filling defects are identified. Central pulmonary arteries are normal caliber. The aorta is normal caliber. No acute luminal abnormality. No periaortic stranding or hemorrhage. Left vertebral artery arises directly from the aortic arch just proximal to the left subclavian artery origin. Proximal great vessels otherwise unremarkable. Normal heart size. No  pericardial effusion.  Mediastinum/Nodes: No mediastinal fluid or gas. Normal thyroid gland and thoracic inlet. No acute abnormality of the trachea or esophagus. No worrisome mediastinal, hilar or axillary adenopathy.  Lungs/Pleura: There is a small right pneumothorax ,approximately 10-15% of the right chest cavity, tracking anteriorly and medially. No associated pleural effusion. Some dense bandlike opacities are present in the right middle lobe likely reflecting subsegmental atelectatic changes with more dependent atelectasis posteriorly. Additional atelectatic changes present in the left lung. No other focal or consolidative opacity is seen. No left pneumothorax or effusion. Small medial air cyst/bulla present in the left lung base is unchanged from prior.  Upper Abdomen: Stomach distended with ingested material. No other acute abnormality in the upper abdomen.  Musculoskeletal: No visible displaced rib fractures. No acute traumatic findings of the chest wall. No other acute or suspicious osseous lesions. No worrisome chest wall lesions.  Review of the MIP images confirms  the above findings.  IMPRESSION: 1. Small right pneumothorax occupying approximately 10-15% of the right chest cavity, tracking anteriorly and medially. No associated pleural effusion. Indeterminate etiology, potentially spontaneous 2. No visible displaced rib fractures or traumatic findings of the chest wall. 3. Bandlike opacities in the right middle lobe and right lower lobe likely reflecting subsegmental atelectatic changes with more dependent atelectasis posteriorly.  These results were called by telephone at the time of interpretation on 06/19/2020 at 1:09 am to provider [facility] , who verbally acknowledged these results.  Electronically Signed: By: Lovena Le M.D. On: 06/19/2020 01:10    I personally reviewed the chest x-ray and chest CT images and concur with the findings noted above.  Impression: Natalya Domzalski is a 39 year old woman with a history of tobacco abuse, uterine fibroid, ovarian cyst, hypertension, Barrett's esophagus, gastric ulcer, and migraines.  She presented on Sunday, 06/19/2020 with sudden onset of right-sided chest pain and shortness of breath.  Work-up revealed a small spontaneous pneumothorax.  This was stable and a follow-up chest x-ray in 6 hours.  She was discharged home and now returns with a repeat chest x-ray which shows near complete resolution of the pneumothorax.  I explained to her that spontaneous pneumothorax is relatively common.  Usually is due to bleb rupture.  Her CT does not show any major blebs but is possible that she had a small bleb the rupture.  That would be consistent with her having a relatively small pneumo that can be managed without a tube.  Catamenial pneumothorax is also a consideration in a 39 year old female, but she has had a hysterectomy and at least a unilateral oophorectomy.  Therefore I think that is less likely.  I emphasized the importance of tobacco cessation not only regarding this issue but also her  overall long-term health.  She understands those issues.  I advised her to wait about a week before going back to work if there is some physical activity involved in her job.  She should not lift anything over 25 pounds for at least 2 weeks after going back to work.  She knows to seek medical attention immediately if her symptoms recur.  Plan: Quit smoking May return to work on Monday, 06/27/2020 No lifting over 25 pounds for the next 3 weeks  I spent 30 minutes in review of records, review of images and in consultation with Ms. Paganelli today Melrose Nakayama, MD [facility] [phone]

## 2020-06-26 ENCOUNTER — Emergency Department (HOSPITAL_BASED_OUTPATIENT_CLINIC_OR_DEPARTMENT_OTHER): Payer: Commercial Managed Care - PPO

## 2020-06-26 ENCOUNTER — Other Ambulatory Visit: Payer: Self-pay

## 2020-06-26 ENCOUNTER — Encounter (HOSPITAL_BASED_OUTPATIENT_CLINIC_OR_DEPARTMENT_OTHER): Payer: Self-pay | Admitting: Emergency Medicine

## 2020-06-26 ENCOUNTER — Emergency Department (HOSPITAL_BASED_OUTPATIENT_CLINIC_OR_DEPARTMENT_OTHER)
Admission: EM | Admit: 2020-06-26 | Discharge: 2020-06-26 | Disposition: A | Discharge status: Home / Self Care | Payer: Commercial Managed Care - PPO | Attending: Emergency Medicine | Admitting: Emergency Medicine

## 2020-06-26 DIAGNOSIS — Z79899 Other long term (current) drug therapy: Secondary | ICD-10-CM | POA: Insufficient documentation

## 2020-06-26 DIAGNOSIS — I10 Essential (primary) hypertension: Secondary | ICD-10-CM | POA: Diagnosis not present

## 2020-06-26 DIAGNOSIS — R0602 Shortness of breath: Secondary | ICD-10-CM | POA: Diagnosis not present

## 2020-06-26 DIAGNOSIS — F17210 Nicotine dependence, cigarettes, uncomplicated: Secondary | ICD-10-CM | POA: Diagnosis not present

## 2020-06-26 DIAGNOSIS — M546 Pain in thoracic spine: Secondary | ICD-10-CM | POA: Insufficient documentation

## 2020-06-26 DIAGNOSIS — R0789 Other chest pain: Secondary | ICD-10-CM | POA: Diagnosis not present

## 2020-06-26 HISTORY — DX: Pneumothorax, unspecified: J93.9

## 2020-06-26 MED ORDER — METHOCARBAMOL 500 MG PO TABS
500.0000 mg | ORAL_TABLET | Freq: Three times a day (TID) | ORAL | 0 refills | Status: DC | PRN
Start: 2020-06-26 — End: 2020-10-18

## 2020-06-26 MED ORDER — NAPROXEN 250 MG PO TABS
500.0000 mg | ORAL_TABLET | Freq: Once | ORAL | Status: AC
  Administered 2020-06-26: 500 mg via ORAL
  Filled 2020-06-26: qty 2

## 2020-06-26 MED ORDER — PANTOPRAZOLE SODIUM 20 MG PO TBEC
20.0000 mg | DELAYED_RELEASE_TABLET | Freq: Every day | ORAL | 0 refills | Status: AC
Start: 2020-06-26 — End: ?

## 2020-06-26 MED ORDER — OXYCODONE-ACETAMINOPHEN 5-325 MG PO TABS
1.0000 | ORAL_TABLET | Freq: Once | ORAL | Status: AC
  Administered 2020-06-26: 1 via ORAL
  Filled 2020-06-26: qty 1

## 2020-06-26 NOTE — ED Provider Notes (Signed)
EKG shows normal sinus rhythm.  Normal intervals.   Lennice Sites, DO 06/26/20 1412

## 2020-06-26 NOTE — Discharge Instructions (Addendum)
Please take Naproxen (aleve) and Tylenol (acetaminophen) to relieve your pain.  You may take two pills of aleve every 12 hours.  After 2-3 days please try to only take one pill every 12 hours.  In between doses of aleve you may take tylenol, up to 1,000 mg (two extra strength pills).  Do not take more than 3,000 mg tylenol in a 24 hour period.  Please check all medication labels as many medications such as pain and cold medications may contain tylenol.  Do not drink alcohol while taking these medications.  Do not take other NSAID'S while taking aleve (naproxen) (such as motrin, ibuprofen, voltaren or advil).  Please take aleve (naproxen) with food to decrease stomach upset.  If the aleve irritates your stomach you may stop and get voltaren gel.  This is over the counter and follow directions on the box.    Today you received medications that may make you sleepy or impair your ability to make decisions.  For the next 24 hours please do not drive, operate heavy machinery, care for a small child with out another adult present, or perform any activities that may cause harm to you or someone else if you were to fall asleep or be impaired.   You are being prescribed a medication which may make you sleepy. Please follow up of listed precautions for at least 24 hours after taking one dose.

## 2020-06-26 NOTE — ED Triage Notes (Signed)
Pt reports recently diagnosed with "collapsed lung" now having severe chest tightness that radiates around to her back.

## 2020-06-26 NOTE — ED Notes (Signed)
ED Provider at bedside. 

## 2020-06-26 NOTE — ED Notes (Signed)
Patient transported to X-ray 

## 2020-06-26 NOTE — ED Provider Notes (Signed)
[facility] EMERGENCY DEPARTMENT Provider Note   CSN: [id number] Arrival date & time: 06/26/20  1210     History Chief Complaint  Patient presents with  . Chest Pain    Tiffany Christensen is a 39 y.o. female with a past medical history of iron deficiency  anemia, gastric ulcers, who presents today for evaluation of pain in her chest and back.  She reports that she has had ongoing pain since she was diagnosed with a spontaneous pneumothorax 8 days ago.  She saw cardiothoracic surgery 6 days ago and they reported that her condition was improving.  She reports that her pain is in her back between her shoulder blades.  She states that this is constant and unchanged since she had the pneumothorax diagnosed.  She has not taken any medications today including her running out of her narcotics on Thursday, no muscle relaxers, NSAIDs or Tylenol prior to arrival.  She reports she is having bad spasms and pain.  She denies any fevers.  She denies any abdominal pain or fevers.  She reports the pain shoots around her back.  No leg swelling.Her pain is made worse with movement.    Review of [place] PMP shows that she filled a rx for 20 hydrocodone, supposed to last 5 days on 06/21/20 that was written on 06/02/20 and she reports she ran out on 06/23/20.  She also filled a rx for 15 oxycodone on 7/11 that was written the same day.   06/21/2020  1   06/02/2020  Hydrocodone-Acetamin 5-325 MG  20.00  [address] [place]   [id number]   [facility] ([id number])   0/0  20.00 MME  Comm Ins   [place]  06/19/2020  1   06/19/2020  Oxycodone-Acetaminophen 5-325  15.00  2 [place]   [id number]   [facility] ([id number])   0/0  56.25 MME  Comm Ins       HPI     Past Medical History:  Diagnosis Date  . Barrett's esophagus   . Gastric ulcer   . Hypertension   . Migraine   . Ovarian cyst   . Pneumothorax on right   . Uterine fibroid     Patient Active Problem List   Diagnosis Date Noted  . 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 Menorrhagia 12/08/2016  . Anemia  due to chronic blood loss 12/08/2016  . 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 Left sided abdominal pain   . Iron deficiency anemia due to chronic blood loss 12/05/2016    Past Surgical History:  Procedure Laterality Date  . ABDOMINAL HYSTERECTOMY    . BREAST LUMPECTOMY    . BREAST SURGERY    . CARPAL TUNNEL RELEASE    . ESOPHAGOGASTRODUODENOSCOPY N/A 12/07/2016   Procedure: ESOPHAGOGASTRODUODENOSCOPY (EGD);  Surgeon: Doran Stabler, MD;  Location: [facility] ENDOSCOPY;  Service: Endoscopy;  Laterality: N/A;  . HAND SURGERY Right   . hemmorhoidectomy    . HEMORRHOID SURGERY    . HERNIA REPAIR       OB History   No obstetric history on file.     Family History  Problem Relation Age of Onset  . Hypertension Maternal Grandmother   . Cancer Sister     Social History   Tobacco Use  . Smoking status: Current Every Day Smoker    Packs/day: 0.50    Types: Cigarettes  . Smokeless tobacco: Never Used  . Tobacco comment: Quit x 2 weeks ago  Vaping  Use  . Vaping Use: Never used  Substance Use Topics  . Alcohol use: No  . Drug use: No    Home Medications Prior to Admission medications   Medication Sig Start Date End Date Taking? Authorizing Provider  amitriptyline (ELAVIL) 50 MG tablet Take 50 mg by mouth at bedtime.    [provider]  methocarbamol (ROBAXIN) 500 MG tablet Take 1 tablet (500 mg total) by mouth every 8 (eight) hours as needed for muscle spasms. 06/26/20   Lorin Glass, PA-C  nicotine (NICODERM CQ) 21 mg/24hr patch Place 1 patch (21 mg total) onto the skin daily. 06/19/20   Orpah Greek, MD  ondansetron (ZOFRAN-ODT) 4 MG disintegrating tablet Take 4 mg by mouth every 8 (eight) hours as needed. Patient not taking: Reported on 06/19/2020 02/16/20   [provider]  pantoprazole (PROTONIX) 20 MG tablet Take 1 tablet (20 mg total) by mouth daily. 06/26/20   Lorin Glass, PA-C  SUMAtriptan (IMITREX) 50  MG tablet Take 50 mg by mouth every 2 (two) hours as needed for migraine or headache.  Patient not taking: Reported on 06/21/2020 05/06/20   [provider]  traMADol (ULTRAM) 50 MG tablet Take 50 mg by mouth every 6 (six) hours as needed for moderate pain or severe pain.  Patient not taking: Reported on 06/21/2020 05/06/20   [provider]  zonisamide (ZONEGRAN) 100 MG capsule  02/16/20   [provider]    Allergies    Patient has no known allergies.  Review of Systems   Review of Systems  Constitutional: Negative for chills and fever.  HENT: Negative for congestion.   Respiratory: Positive for chest tightness and shortness of breath.   Cardiovascular: Positive for chest pain. Negative for palpitations and leg swelling.  Gastrointestinal: Negative for abdominal pain, diarrhea, nausea and vomiting.  Genitourinary: Negative for dysuria.  Musculoskeletal: Positive for back pain.  Skin: Negative for color change, rash and wound.  Neurological: Negative for weakness and headaches.  Psychiatric/Behavioral: Negative for confusion.  All other systems reviewed and are negative.   Physical Exam Updated Vital Signs BP 122/82   Pulse 77   Temp 98 F (36.7 C) (Oral)   Resp 14   Ht 5\' 5"  (1.651 m)   Wt 57.6 kg   LMP 11/08/2016 (Exact Date) Comment: neg upreg  SpO2 98%   BMI 21.13 kg/m   Physical Exam Vitals and nursing note reviewed.  Constitutional:      General: She is not in acute distress.    Appearance: She is well-developed. She is not diaphoretic.  HENT:     Head: Normocephalic and atraumatic.  Eyes:     General: No scleral icterus.       Right eye: No discharge.        Left eye: No discharge.     Conjunctiva/sclera: Conjunctivae normal.  Cardiovascular:     Rate and Rhythm: Normal rate and regular rhythm.     Pulses:          Dorsalis pedis pulses are 2+ on the right side and 2+ on the left side.       Posterior tibial pulses are 2+ on the  right side and 2+ on the left side.  Pulmonary:     Effort: Pulmonary effort is normal. No respiratory distress.     Breath sounds: Normal breath sounds. No stridor. No decreased breath sounds, wheezing, rhonchi or rales.  Chest:     Chest wall: Tenderness (  Diffusely anterior and posteriorly) present.  Abdominal:     General: There is no distension.     Palpations: Abdomen is soft.     Tenderness: There is no abdominal tenderness.  Musculoskeletal:        General: No deformity.     Cervical back: Normal range of motion and neck supple.  Skin:    General: Skin is warm and dry.  Neurological:     General: No focal deficit present.     Mental Status: She is alert.     Motor: No abnormal muscle tone.  Psychiatric:        Mood and Affect: Mood normal.        Behavior: Behavior normal.     ED Results / Procedures / Treatments   Labs (all labs ordered are listed, but only abnormal results are displayed) Labs Reviewed - No data to display  EKG None  Radiology DG Chest 2 View  Result Date: 06/26/2020 CLINICAL DATA:  Central chest pain and shortness of breath for the past 2 days. Smoker. EXAM: CHEST - 2 VIEW COMPARISON:  06/21/2020 FINDINGS: Normal sized heart. Clear lungs. Minimal diffuse peribronchial thickening. Unremarkable bones. IMPRESSION: Minimal bronchitic changes. Electronically Signed   By: Claudie Revering M.D.   On: 06/26/2020 13:31    Procedures Procedures (including critical care time)  Medications Ordered in ED Medications  oxyCODONE-acetaminophen (PERCOCET/ROXICET) 5-325 MG per tablet 1 tablet (1 tablet Oral Given 06/26/20 1346)  naproxen (NAPROSYN) tablet 500 mg (500 mg Oral Given 06/26/20 1345)    ED Course  I have reviewed the triage vital signs and the nursing notes.  Pertinent labs & imaging results that were available during my care of the patient were reviewed by me and considered in my medical decision making (see chart for details).    MDM  Rules/Calculators/A&P                         ROSENE PILLING is a 39 year old woman who presents today for evaluation of chest pain.  She was originally seen 10 days ago and had PE study performed, with no PEs found however there is a small pneumothorax.  Since then she has been seen by pulmonology.  She states that the chest pain that she got that caused her to come in occurred when she was leaving the funeral home for her aunt.  She did not require operative intervention.  She reports that she is out of her pain medications.  She states that she has not had anything for this pain in 3 days.  She denies any changes to the pain states that it is the same as before.  Chest x-ray was obtained without evidence of consolidation, pneumothorax or other acute abnormalities.  She is afebrile, not tachycardic or tachypneic.  Her pain was treated in the emergency room with p.o. medications.  EKG obtained without evidence of ischemia.  Given that she recently had extensive work-up for PE, and is without hypoxia, hemoptysis, or other objective signs of PE no indication for repeat scan.  Her primary concern is the unchanged pain.  Patient only had a long discussion about her symptoms.  These started originally during a very stressful events.  [facility] PMP shows that she got her prescription for pain medication after the pneumothorax, and then 2 days later filled a prescription that was a couple weeks old for oxycodone that should have lasted for 5 days.  If she is  out of her pain medication she indicated she would have taken 5 days with the pain medication and only 2 days.  I do not feel like providing additional narcotic pain medication is in the patient's best interest today as in the past week she got 15 hydrocodone's and 20 Percocets.  Recommended OTC Aleve in place of prescription naproxen, recommended continued muscle relaxers as needed, along with PCP follow-up.  She does say that her pain gets worse that  she is mostly laying in her room, anxious and thinking about everything that has happened.  Expressed empathy for patient situation, recommended mindfulness based stress reduction, and therapy.    This patient was discussed with Dr. Ronnald Nian.   Return precautions were discussed with patient who states their understanding.  At the time of discharge patient denied any unaddressed complaints or concerns.  Patient is agreeable for discharge home.  Note: Portions of this report may have been transcribed using voice recognition software. Every effort was made to ensure accuracy; however, inadvertent computerized transcription errors may be present  Final Clinical Impression(s) / ED Diagnoses Final diagnoses:  Atypical chest pain  Acute bilateral thoracic back pain    Rx / DC Orders ED Discharge Orders         Ordered    pantoprazole (PROTONIX) 20 MG tablet  Daily     Discontinue  Reprint     06/26/20 1508    methocarbamol (ROBAXIN) 500 MG tablet  Every 8 hours PRN     Discontinue  Reprint     06/26/20 [place], [place], PA-C 06/26/20 [place], [place], DO 06/27/20 1537

## 2020-06-26 NOTE — ED Notes (Signed)
Medicated for pain per ED provider orders, warm blanket provided, safety measures in place, call bell with pt

## 2020-07-05 ENCOUNTER — Telehealth: Payer: Self-pay

## 2020-07-05 NOTE — Telephone Encounter (Signed)
Patient contacted the office today with complaints of pain/ spasm in her chest.  She stated that she was not short of breath like she had been, but the pain has not let up.  I advised that she should not wait for an appointment if her symptoms were life threatening.  She stated that she had been to the "ED several times" with no outcome.  Advised patient that she would get a call back regarding next steps.  Contacted patient back, she did not answer the phone and I was unable to leave a voicemail, mailbox full.  If she returns the call, patient should get a chest xray to see if she has any lung changes/ spontaneous pneumothorax, would contact patient for results. At this time no appointment necessary unless chest xray shows recurrence. Will advised patient to contact her PCP if no recurrence.

## 2020-07-14 ENCOUNTER — Ambulatory Visit
Admission: RE | Admit: 2020-07-14 | Discharge: 2020-07-14 | Disposition: A | Discharge status: Home / Self Care | Payer: Medicaid Other | Source: Ambulatory Visit | Attending: Thoracic Surgery (Cardiothoracic Vascular Surgery) | Admitting: Thoracic Surgery (Cardiothoracic Vascular Surgery)

## 2020-07-14 ENCOUNTER — Other Ambulatory Visit: Payer: Self-pay

## 2020-07-14 DIAGNOSIS — J930 Spontaneous tension pneumothorax: Secondary | ICD-10-CM

## 2020-10-10 ENCOUNTER — Emergency Department (HOSPITAL_BASED_OUTPATIENT_CLINIC_OR_DEPARTMENT_OTHER)
Admission: EM | Admit: 2020-10-10 | Discharge: 2020-10-10 | Disposition: A | Discharge status: Home / Self Care | Payer: Commercial Managed Care - PPO | Attending: Emergency Medicine | Admitting: Emergency Medicine

## 2020-10-10 ENCOUNTER — Emergency Department (HOSPITAL_BASED_OUTPATIENT_CLINIC_OR_DEPARTMENT_OTHER): Payer: Commercial Managed Care - PPO

## 2020-10-10 ENCOUNTER — Other Ambulatory Visit: Payer: Self-pay

## 2020-10-10 ENCOUNTER — Encounter (HOSPITAL_BASED_OUTPATIENT_CLINIC_OR_DEPARTMENT_OTHER): Payer: Self-pay | Admitting: *Deleted

## 2020-10-10 DIAGNOSIS — R2232 Localized swelling, mass and lump, left upper limb: Secondary | ICD-10-CM

## 2020-10-10 DIAGNOSIS — S6992XA Unspecified injury of left wrist, hand and finger(s), initial encounter: Secondary | ICD-10-CM | POA: Insufficient documentation

## 2020-10-10 DIAGNOSIS — M79642 Pain in left hand: Secondary | ICD-10-CM

## 2020-10-10 DIAGNOSIS — W208XXA Other cause of strike by thrown, projected or falling object, initial encounter: Secondary | ICD-10-CM | POA: Diagnosis not present

## 2020-10-10 DIAGNOSIS — I10 Essential (primary) hypertension: Secondary | ICD-10-CM | POA: Insufficient documentation

## 2020-10-10 DIAGNOSIS — F17210 Nicotine dependence, cigarettes, uncomplicated: Secondary | ICD-10-CM | POA: Insufficient documentation

## 2020-10-10 DIAGNOSIS — Z79899 Other long term (current) drug therapy: Secondary | ICD-10-CM | POA: Insufficient documentation

## 2020-10-10 MED ORDER — IBUPROFEN 400 MG PO TABS
400.0000 mg | ORAL_TABLET | Freq: Once | ORAL | Status: AC
  Administered 2020-10-10: 400 mg via ORAL
  Filled 2020-10-10: qty 1

## 2020-10-10 NOTE — ED Triage Notes (Signed)
Left hand injury. A can dropped from a shelf and hit the top of her hand. Swelling and pain.

## 2020-10-10 NOTE — Discharge Instructions (Signed)
Take ibuprofen 3 times a day with meals.  Do not take other anti-inflammatories at the same time (Advil, Motrin, naproxen, Aleve). You may supplement with Tylenol if you need further pain control. Use ice packs, 20 minutes at a time, at least 3 times a day. Keep your hand elevated to help with pain and swelling. Follow-up with the hand doctor listed below your pain is not improving. Return to the emergency room if you develop severe worsening pain, if your fingers turn white, if you have numbness of your fingers, or any new, worsening, or concerning symptoms.

## 2020-10-10 NOTE — ED Provider Notes (Signed)
[facility] EMERGENCY DEPARTMENT Provider Note   CSN: [id number] Arrival date & time: 10/10/20  1430     History Chief Complaint  Patient presents with  . Hand Injury    Tiffany Christensen is a 39 y.o. female presenting for evaluation of left hand pain and swelling.  Patient states just prior to arrival she dropped a soup can on her left hand.  She has had pain of the dorsal aspect and swelling since then.  She has not taken anything for this.  She denies numbness or tingling.  She is not on blood thinners.  She denies injury elsewhere.  Pain does not radiate.  It is worse with palpation and movement, nothing makes it better.   HPI     Past Medical History:  Diagnosis Date  . Barrett's esophagus   . Gastric ulcer   . Hypertension   . Migraine   . Ovarian cyst   . Pneumothorax on right   . Uterine fibroid     Patient Active Problem List   Diagnosis Date Noted  . 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 Menorrhagia 12/08/2016  . Anemia due to chronic blood loss 12/08/2016  . 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 Left sided abdominal pain   . Iron deficiency anemia due to chronic blood loss 12/05/2016    Past Surgical History:  Procedure Laterality Date  . ABDOMINAL HYSTERECTOMY    . BREAST LUMPECTOMY    . BREAST SURGERY    . CARPAL TUNNEL RELEASE    . ESOPHAGOGASTRODUODENOSCOPY N/A 12/07/2016   Procedure: ESOPHAGOGASTRODUODENOSCOPY (EGD);  Surgeon: Doran Stabler, MD;  Location: [facility] ENDOSCOPY;  Service: Endoscopy;  Laterality: N/A;  . HAND SURGERY Right   . hemmorhoidectomy    . HEMORRHOID SURGERY    . HERNIA REPAIR       OB History   No obstetric history on file.     Family History  Problem Relation Age of Onset  . Hypertension Maternal Grandmother   . Cancer Sister     Social History   Tobacco Use  . Smoking status: Current Every Day Smoker    Packs/day: 0.50    Types: Cigarettes  . Smokeless tobacco:  Never Used  . Tobacco comment: Quit x 2 weeks ago  Vaping Use  . Vaping Use: Never used  Substance Use Topics  . Alcohol use: No  . Drug use: No    Home Medications Prior to Admission medications   Medication Sig Start Date End Date Taking? Authorizing Provider  amitriptyline (ELAVIL) 50 MG tablet Take 50 mg by mouth at bedtime.    [provider]  methocarbamol (ROBAXIN) 500 MG tablet Take 1 tablet (500 mg total) by mouth every 8 (eight) hours as needed for muscle spasms. 06/26/20   Lorin Glass, PA-C  nicotine (NICODERM CQ) 21 mg/24hr patch Place 1 patch (21 mg total) onto the skin daily. 06/19/20   Orpah Greek, MD  ondansetron (ZOFRAN-ODT) 4 MG disintegrating tablet Take 4 mg by mouth every 8 (eight) hours as needed. Patient not taking: Reported on 06/19/2020 02/16/20   [provider]  pantoprazole (PROTONIX) 20 MG tablet Take 1 tablet (20 mg total) by mouth daily. 06/26/20   Lorin Glass, PA-C  SUMAtriptan (IMITREX) 50 MG tablet Take 50 mg by mouth every 2 (two) hours as needed for migraine or headache.  Patient not taking: Reported on 06/21/2020 05/06/20   [provider]  traMADol (  ULTRAM) 50 MG tablet Take 50 mg by mouth every 6 (six) hours as needed for moderate pain or severe pain.  Patient not taking: Reported on 06/21/2020 05/06/20   [provider]  zonisamide (ZONEGRAN) 100 MG capsule  02/16/20   [provider]    Allergies    Patient has no known allergies.  Review of Systems   Review of Systems  Musculoskeletal: Positive for arthralgias and joint swelling.  Hematological: Does not bruise/bleed easily.    Physical Exam Updated Vital Signs BP 112/81 (BP Location: Right Arm)   Pulse 78   Temp 98.9 F (37.2 C) (Oral)   Resp 19   Ht 5\' 5"  (1.651 m)   Wt 55.1 kg   LMP 11/08/2016 (Exact Date) Comment: neg upreg  SpO2 100%   BMI 20.22 kg/m   Physical Exam Vitals and nursing note reviewed.    Constitutional:      General: She is not in acute distress.    Appearance: She is well-developed.  HENT:     Head: Normocephalic and atraumatic.  Pulmonary:     Effort: Pulmonary effort is normal.  Abdominal:     General: There is no distension.  Musculoskeletal:        General: Swelling and tenderness present.     Cervical back: Normal range of motion.     Comments: Swelling of the left hand on the dorsal aspect.  No chest palpation of the carpals.  Tenderness palpation of the entire hand and fingers.  Good distal sensation and cap refill.  No tenderness palpation over the thumb or anatomic snuffbox.  Skin:    General: Skin is warm.     Capillary Refill: Capillary refill takes less than 2 seconds.     Findings: No rash.  Neurological:     Mental Status: She is alert and oriented to person, place, and time.     ED Results / Procedures / Treatments   Labs (all labs ordered are listed, but only abnormal results are displayed) Labs Reviewed - No data to display  EKG None  Radiology DG Hand Complete Left  Result Date: 10/10/2020 CLINICAL DATA:  Crush injury, pain and swelling second and third metacarpal EXAM: LEFT HAND - COMPLETE 3+ VIEW COMPARISON:  None. FINDINGS: Frontal, oblique, and lateral views of the left hand are obtained. No fracture, subluxation, or dislocation. Joint spaces are well preserved. There is dorsal soft tissue swelling of the hand. IMPRESSION: 1. Soft tissue swelling.  No acute fracture. Electronically Signed   By: Randa Ngo M.D.   On: 10/10/2020 14:59    Procedures Procedures (including critical care time)  Medications Ordered in ED Medications  ibuprofen (ADVIL) tablet 400 mg (400 mg Oral Given 10/10/20 1440)    ED Course  I have reviewed the triage vital signs and the nursing notes.  Pertinent labs & imaging results that were available during my care of the patient were reviewed by me and considered in my medical decision making (see chart  for details).    MDM Rules/Calculators/A&P                          Patient presented for evaluation of left hand pain and swelling.  On exam, patient appears nontoxic.  She is neurovascular intact.  X-ray obtained in triage read interpreted by me, no fracture dislocation.  As such, likely MSK pain and swelling.  Consider ligamentous injury due to dorsal swelling and pain with  movement of the fingers.  Discussed treatment with Tylenol, NSAIDs, rest, ice, elevation.  Follow-up with hand if symptoms not improving.  At this time, patient appears safe for discharge.  Return precautions given.  Patient states she understands and agrees to plan.  Final Clinical Impression(s) / ED Diagnoses Final diagnoses:  Left hand pain  Localized swelling on left hand    Rx / DC Orders ED Discharge Orders    None       Franchot Heidelberg, PA-C 10/10/20 1719    Quintella Reichert, MD 10/10/20 2112

## 2020-10-18 ENCOUNTER — Other Ambulatory Visit: Payer: Self-pay

## 2020-10-18 ENCOUNTER — Emergency Department (HOSPITAL_BASED_OUTPATIENT_CLINIC_OR_DEPARTMENT_OTHER)
Admission: EM | Admit: 2020-10-18 | Discharge: 2020-10-18 | Disposition: A | Discharge status: Home / Self Care | Payer: Commercial Managed Care - PPO | Attending: Emergency Medicine | Admitting: Emergency Medicine

## 2020-10-18 ENCOUNTER — Encounter (HOSPITAL_BASED_OUTPATIENT_CLINIC_OR_DEPARTMENT_OTHER): Payer: Self-pay | Admitting: Emergency Medicine

## 2020-10-18 DIAGNOSIS — Y92009 Unspecified place in unspecified non-institutional (private) residence as the place of occurrence of the external cause: Secondary | ICD-10-CM | POA: Insufficient documentation

## 2020-10-18 DIAGNOSIS — Y939 Activity, unspecified: Secondary | ICD-10-CM | POA: Insufficient documentation

## 2020-10-18 DIAGNOSIS — F17210 Nicotine dependence, cigarettes, uncomplicated: Secondary | ICD-10-CM | POA: Diagnosis not present

## 2020-10-18 DIAGNOSIS — S6722XD Crushing injury of left hand, subsequent encounter: Secondary | ICD-10-CM

## 2020-10-18 DIAGNOSIS — W231XXD Caught, crushed, jammed, or pinched between stationary objects, subsequent encounter: Secondary | ICD-10-CM | POA: Diagnosis not present

## 2020-10-18 DIAGNOSIS — S60222D Contusion of left hand, subsequent encounter: Secondary | ICD-10-CM | POA: Insufficient documentation

## 2020-10-18 DIAGNOSIS — I10 Essential (primary) hypertension: Secondary | ICD-10-CM | POA: Insufficient documentation

## 2020-10-18 DIAGNOSIS — Z79899 Other long term (current) drug therapy: Secondary | ICD-10-CM | POA: Insufficient documentation

## 2020-10-18 DIAGNOSIS — Y999 Unspecified external cause status: Secondary | ICD-10-CM | POA: Insufficient documentation

## 2020-10-18 MED ORDER — NAPROXEN 375 MG PO TABS
ORAL_TABLET | ORAL | 0 refills | Status: DC
Start: 2020-10-18 — End: 2020-11-23

## 2020-10-18 MED ORDER — NAPROXEN 250 MG PO TABS
500.0000 mg | ORAL_TABLET | Freq: Once | ORAL | Status: AC
  Administered 2020-10-18: 500 mg via ORAL
  Filled 2020-10-18: qty 2

## 2020-10-18 NOTE — ED Triage Notes (Signed)
Pt states she had a can of beans fall on her hand Monday  Pt states she was seen here and had a xray  Pt states she continues to have pain with movement   Pt is requesting an MRI of her hand

## 2020-10-18 NOTE — ED Notes (Signed)
Discharge instructions discussed. Pt discharged from computer. Remains in ED with son.

## 2020-10-18 NOTE — ED Provider Notes (Signed)
[facility] Provider Note: [name], MD, FACEP  CSN: [id number] MRN: [id number] ARRIVAL: 10/18/20 at [place]: [place] [facility]   HISTORY OF PRESENT ILLNESS  10/18/20 3:21 AM Tiffany Christensen is a 39 y.o. female who dropped a can of beans on her left hand 8 days ago.  She was seen in the ED and had an x-ray which showed no fracture.  She continues to have pain and swelling in her left hand.  The pain and swelling overlies the distal aspects of the left second and third metacarpals.  She rates her pain as a 6 out of 10, aching in nature, worse with palpation or movement.   Past Medical History:  Diagnosis Date  . Barrett's esophagus   . Gastric ulcer   . Hypertension   . Migraine   . Ovarian cyst   . Pneumothorax on right   . Uterine fibroid     Past Surgical History:  Procedure Laterality Date  . ABDOMINAL HYSTERECTOMY    . BREAST LUMPECTOMY    . BREAST SURGERY    . CARPAL TUNNEL RELEASE    . ESOPHAGOGASTRODUODENOSCOPY N/A 12/07/2016   Procedure: ESOPHAGOGASTRODUODENOSCOPY (EGD);  Surgeon: Doran Stabler, MD;  [facility] ENDOSCOPY;  Service: Endoscopy;  Laterality: N/A;  . HAND SURGERY Right   . hemmorhoidectomy    . HEMORRHOID SURGERY    . HERNIA REPAIR      Family History  Problem Relation Age of Onset  . Hypertension Maternal Grandmother   . Cancer Sister     Social History   Tobacco Use  . Smoking status: Current Every Day Smoker    Packs/day: 0.50    Types: Cigarettes  . Smokeless tobacco: Never Used  . Tobacco comment: Quit x 2 weeks ago  Vaping Use  . Vaping Use: Never used  Substance Use Topics  . Alcohol use: No  . Drug use: No    Prior to Admission medications   Medication Sig Start Date End Date Taking? Authorizing Provider  amitriptyline (ELAVIL) 50 MG tablet Take 50 mg by mouth at bedtime.    [provider]  naproxen (NAPROSYN) 375 MG tablet Take 1 tablet twice daily as needed for  pain. 10/18/20   Pang Robers, MD  nicotine (NICODERM CQ) 21 mg/24hr patch Place 1 patch (21 mg total) onto the skin daily. 06/19/20   Orpah Greek, MD  pantoprazole (PROTONIX) 20 MG tablet Take 1 tablet (20 mg total) by mouth daily. 06/26/20   Lorin Glass, PA-C  SUMAtriptan (IMITREX) 50 MG tablet Take 50 mg by mouth every 2 (two) hours as needed for migraine or headache.  Patient not taking: Reported on 06/21/2020 05/06/20 10/18/20  [provider]  zonisamide (ZONEGRAN) 100 MG capsule  02/16/20 10/18/20  [provider]    Allergies Patient has no known allergies.   REVIEW OF SYSTEMS  Negative except as noted here or in the History of Present Illness.   PHYSICAL EXAMINATION  Initial Vital Signs Blood pressure 117/85, pulse 81, temperature 98.6 F (37 C), temperature source Oral, resp. rate 14, height 5\' 5"  (1.651 m), weight 56.8 kg, last menstrual period 11/08/2016, SpO2 100 %.  Examination General: Well-developed, well-nourished female in no acute distress; appearance consistent with age of record HENT: normocephalic; atraumatic Eyes: Normal appearance Neck: supple Heart: regular rate and rhythm Lungs: clear to auscultation bilaterally Abdomen: soft; nondistended; nontender; bowel sounds present Extremities: No deformity; tenderness and  swelling over dorsal left hand, fingers neurovascularly intact distally:    Neurologic: Awake, alert and oriented; motor function intact in all extremities and symmetric; no facial droop Skin: Warm and dry Psychiatric: Normal mood and affect   RESULTS  Summary of this visit's results, reviewed and interpreted by myself:   EKG Interpretation  Date/Time:    Ventricular Rate:    PR Interval:    QRS Duration:   QT Interval:    QTC Calculation:   R Axis:     Text Interpretation:        Laboratory Studies: No results found for this or any previous visit (from the past 24 hour(s)). Imaging Studies: No  results found.  ED COURSE and MDM  Nursing notes, initial and subsequent vitals signs, including pulse oximetry, reviewed and interpreted by myself.  Vitals:   10/18/20 0250 10/18/20 0251  BP: 117/85   Pulse: 81   Resp: 14   Temp: 98.6 F (37 C)   TempSrc: Oral   SpO2: 100%   Weight:  56.8 kg  Height:  5\' 5"  (1.651 m)   Medications  naproxen (NAPROSYN) tablet 500 mg (has no administration in time range)   Previous radiographs were reviewed and no evidence of fracture was seen.  The patient's examination is consistent with a hematoma.  We will place her in a wrist splint at her request and refer to hand surgery.   PROCEDURES  Procedures   ED DIAGNOSES     ICD-10-CM   1. Crushing injury of left hand, subsequent encounter  S67.22XD   2. Traumatic hematoma of left hand, subsequent encounter  K35.465K        Shanon Rosser, MD 10/18/20 [phone]

## 2020-11-23 ENCOUNTER — Emergency Department (HOSPITAL_BASED_OUTPATIENT_CLINIC_OR_DEPARTMENT_OTHER): Payer: Commercial Managed Care - PPO

## 2020-11-23 ENCOUNTER — Emergency Department (HOSPITAL_BASED_OUTPATIENT_CLINIC_OR_DEPARTMENT_OTHER)
Admission: EM | Admit: 2020-11-23 | Discharge: 2020-11-23 | Disposition: A | Discharge status: Home / Self Care | Payer: Commercial Managed Care - PPO | Attending: Emergency Medicine | Admitting: Emergency Medicine

## 2020-11-23 ENCOUNTER — Encounter (HOSPITAL_BASED_OUTPATIENT_CLINIC_OR_DEPARTMENT_OTHER): Payer: Self-pay | Admitting: *Deleted

## 2020-11-23 ENCOUNTER — Other Ambulatory Visit: Payer: Self-pay

## 2020-11-23 DIAGNOSIS — R0789 Other chest pain: Secondary | ICD-10-CM | POA: Diagnosis not present

## 2020-11-23 DIAGNOSIS — R0602 Shortness of breath: Secondary | ICD-10-CM | POA: Diagnosis not present

## 2020-11-23 DIAGNOSIS — F17210 Nicotine dependence, cigarettes, uncomplicated: Secondary | ICD-10-CM | POA: Insufficient documentation

## 2020-11-23 DIAGNOSIS — I10 Essential (primary) hypertension: Secondary | ICD-10-CM | POA: Insufficient documentation

## 2020-11-23 MED ORDER — ACETAMINOPHEN 325 MG PO TABS: 650.0000 mg | ORAL_TABLET | Freq: Once | ORAL | Status: DC

## 2020-11-23 MED ORDER — IBUPROFEN 400 MG PO TABS
400.0000 mg | ORAL_TABLET | Freq: Once | ORAL | Status: AC
  Administered 2020-11-23: 400 mg via ORAL
  Filled 2020-11-23: qty 1

## 2020-11-23 MED ORDER — HYDROCODONE-ACETAMINOPHEN 5-325 MG PO TABS
2.0000 | ORAL_TABLET | Freq: Once | ORAL | Status: AC
  Administered 2020-11-23: 2 via ORAL
  Filled 2020-11-23: qty 2

## 2020-11-23 MED ORDER — CYCLOBENZAPRINE HCL 10 MG PO TABS
10.0000 mg | ORAL_TABLET | Freq: Once | ORAL | Status: AC
  Administered 2020-11-23: 10 mg via ORAL
  Filled 2020-11-23: qty 1

## 2020-11-23 MED ORDER — HYDROCODONE-ACETAMINOPHEN 5-325 MG PO TABS: 1.0000 | ORAL_TABLET | Freq: Four times a day (QID) | ORAL | 0 refills | Status: DC | PRN

## 2020-11-23 MED ORDER — CYCLOBENZAPRINE HCL 10 MG PO TABS
10.0000 mg | ORAL_TABLET | Freq: Two times a day (BID) | ORAL | 0 refills | Status: DC | PRN
Start: 2020-11-23 — End: 2021-12-21

## 2020-11-23 NOTE — ED Notes (Signed)
ED Provider at bedside. 

## 2020-11-23 NOTE — ED Provider Notes (Signed)
[facility] EMERGENCY DEPARTMENT Provider Note   CSN: [id number] Arrival date & time: 11/23/20  0403     History Chief Complaint  Patient presents with  . Chest Pain    Tiffany Christensen is a 38 y.o. female.  The history is provided by the patient.  Chest Pain Pain location:  R chest Pain quality: sharp   Radiates to: right axilla. Pain severity:  Severe Onset quality:  Sudden Duration:  2 days Timing:  Intermittent Progression:  Worsening Chronicity:  New Relieved by:  Nothing Worsened by:  Deep breathing and certain positions Associated symptoms: shortness of breath   Associated symptoms: no cough, no fever, no lower extremity edema and no vomiting    pt with h/o HTN, previous spontaneous PTX presents with right sided chest pain Denies injury Reports it is worse with breathing/movement and feels similar to prior PTX  Denies known h/o CAD/VTE      Past Medical History:  Diagnosis Date  . Barrett's esophagus   . Gastric ulcer   . Hypertension   . Migraine   . Ovarian cyst   . Pneumothorax on right   . Uterine fibroid     Patient Active Problem List   Diagnosis Date Noted  . 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 Menorrhagia 12/08/2016  . Anemia due to chronic blood loss 12/08/2016  . 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 Left sided abdominal pain   . Iron deficiency anemia due to chronic blood loss 12/05/2016    Past Surgical History:  Procedure Laterality Date  . ABDOMINAL HYSTERECTOMY    . BREAST LUMPECTOMY    . BREAST SURGERY    . CARPAL TUNNEL RELEASE    . ESOPHAGOGASTRODUODENOSCOPY N/A 12/07/2016   Procedure: ESOPHAGOGASTRODUODENOSCOPY (EGD);  Surgeon: Doran Stabler, MD;  Location[facility] ENDOSCOPY;  Service: Endoscopy;  Laterality: N/A;  . HAND SURGERY Right   . hemmorhoidectomy    . HEMORRHOID SURGERY    . HERNIA REPAIR       OB History   No obstetric history on file.     Family History   Problem Relation Age of Onset  . Hypertension Maternal Grandmother   . Cancer Sister     Social History   Tobacco Use  . Smoking status: Current Every Day Smoker    Packs/day: 0.50    Types: Cigarettes  . Smokeless tobacco: Never Used  . Tobacco comment: Quit x 2 weeks ago  Vaping Use  . Vaping Use: Never used  Substance Use Topics  . Alcohol use: No  . Drug use: No    Home Medications Prior to Admission medications   Medication Sig Start Date End Date Taking? Authorizing Provider  amitriptyline (ELAVIL) 50 MG tablet Take 50 mg by mouth at bedtime.    [provider]  pantoprazole (PROTONIX) 20 MG tablet Take 1 tablet (20 mg total) by mouth daily. 06/26/20   Lorin Glass, PA-C  SUMAtriptan (IMITREX) 50 MG tablet Take 50 mg by mouth every 2 (two) hours as needed for migraine or headache.  Patient not taking: Reported on 06/21/2020 05/06/20 10/18/20  [provider]  zonisamide (ZONEGRAN) 100 MG capsule  02/16/20 10/18/20  [provider]    Allergies    Patient has no known allergies.  Review of Systems   Review of Systems  Constitutional: Negative for fever.  Respiratory: Positive for shortness of breath. Negative for cough.   Cardiovascular: Positive for chest pain.  Gastrointestinal: Negative for vomiting.  All other systems reviewed and are negative.   Physical Exam Updated Vital Signs BP 125/85   Pulse 87   Temp 98.1 F (36.7 C) (Oral)   Resp 19   Ht 1.651 m (5\' 5" )   Wt 58.5 kg   LMP 11/08/2016 (Exact Date) Comment: neg upreg  SpO2 100%   BMI 21.47 kg/m   Physical Exam CONSTITUTIONAL: Well developed/well nourished HEAD: Normocephalic/atraumatic EYES: EOMI/PERRL ENMT: Mucous membranes moist NECK: supple no meningeal signs SPINE/BACK:entire spine nontender CV: S1/S2 noted, no murmurs/rubs/gallops noted LUNGS: Lungs are clear to auscultation bilaterally, no apparent distress Chest- mild tenderness to palpation of right  chest, no crepitus ABDOMEN: soft, nontender  NEURO: Pt is awake/alert/appropriate, moves all extremitiesx4.  No facial droop.   EXTREMITIES: pulses normal/equal, full ROM, no LE edema SKIN: warm, color normal PSYCH: anxious  ED Results / Procedures / Treatments   Labs (all labs ordered are listed, but only abnormal results are displayed) Labs Reviewed - No data to display  EKG EKG Interpretation  Date/Time:  Wednesday November 23 2020 04:29:50 EST Ventricular Rate:  75 PR Interval:    QRS Duration: 83 QT Interval:  374 QTC Calculation: 418 R Axis:   66 Text Interpretation: Sinus rhythm Confirmed by Ripley Fraise [phone]) on 11/23/2020 4:34:59 AM   Radiology DG Chest 2 View  Result Date: 11/23/2020 CLINICAL DATA:  RIGHT-SIDED CHEST PAIN.  HISTORY OF PNEUMOTHORAX. EXAM: CHEST - 2 VIEW COMPARISON:  07/14/2020 FINDINGS: The lungs are clear without focal pneumonia, edema, pneumothorax or pleural effusion. The cardiopericardial silhouette is within normal limits for size. The visualized bony structures of the thorax show no acute abnormality. IMPRESSION: No active cardiopulmonary disease. Electronically Signed   By: Misty Stanley M.D.   On: 11/23/2020 04:55    Procedures Procedures   Medications Ordered in ED Medications  ibuprofen (ADVIL) tablet 400 mg (has no administration in time range)  HYDROcodone-acetaminophen (NORCO/VICODIN) 5-325 MG per tablet 2 tablet (2 tablets Oral Given 11/23/20 0518)    ED Course  I have reviewed the triage vital signs and the nursing notes.  Pertinent labs & imaging results that were available during my care of the patient were reviewed by me and considered in my medical decision making (see chart for details).    MDM Rules/Calculators/A&P                          5:39 AM Pt with previous h/o PTX, reports it feels similar She never required tube thoracostomy Will monitor and treat pain Pt appears PERC negative Low suspicion for  ACS/PE/Dissection  6:39 AM Plan at signout is to repeat CXR at 830am to ensure there is no occult PTX If negative patient can be discharged home Final Clinical Impression(s) / ED Diagnoses Final diagnoses:  Chest wall pain    Rx / DC Orders ED Discharge Orders         Ordered    HYDROcodone-acetaminophen (NORCO/VICODIN) 5-325 MG tablet  Every 6 hours PRN        11/23/20 0639           Ripley Fraise, MD 11/23/20 [phone]

## 2020-11-23 NOTE — ED Provider Notes (Signed)
Patient signed out to me at 7 AM awaiting repeat chest x-ray.  History of pneumothorax.  Pain for the last 2 days.  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  Has improved with Norco and Motrin in the ED.  Has some reproducible chest pain on exam.  Overall clear breath sounds.  PERC negative.  EKG shows sinus rhythm.  No cardiac risk factors.  Doubt ACS.  Will repeat x-ray to rule out occult pneumothorax.  Anticipate discharge to home.  Repeat chest x-ray normal.  No pneumothorax.  Norco and muscle relaxant given.  Recommend continued use of Tylenol, Motrin.  Consider lidocaine patches.  Given return precautions discharged in ED in good condition.  This chart was dictated using voice recognition software.  Despite best efforts to proofread,  errors can occur which can change the documentation meaning.     Lennice Sites, DO 11/23/20 [phone]

## 2020-11-23 NOTE — ED Notes (Signed)
Pt ambulatory to bathroom with minimal assistance  

## 2020-11-23 NOTE — ED Triage Notes (Addendum)
C/o right sided chest pain with inspiration that started a couple days ago, but worse today. Describes as sharp and worse with breathing. Hx of a pneumothorax. States she feels a little sob. 02 sats 100% on RA. Denies any injury. Pt is a smoker.

## 2020-11-23 NOTE — Discharge Instructions (Signed)

## 2020-11-23 NOTE — ED Notes (Signed)
Patient returned from X-ray 

## 2021-01-15 ENCOUNTER — Emergency Department (HOSPITAL_BASED_OUTPATIENT_CLINIC_OR_DEPARTMENT_OTHER): Payer: Commercial Managed Care - PPO

## 2021-01-15 ENCOUNTER — Emergency Department (HOSPITAL_BASED_OUTPATIENT_CLINIC_OR_DEPARTMENT_OTHER)
Admission: EM | Admit: 2021-01-15 | Discharge: 2021-01-15 | Disposition: A | Discharge status: Home / Self Care | Payer: Commercial Managed Care - PPO | Attending: Emergency Medicine | Admitting: Emergency Medicine

## 2021-01-15 ENCOUNTER — Encounter (HOSPITAL_BASED_OUTPATIENT_CLINIC_OR_DEPARTMENT_OTHER): Payer: Self-pay | Admitting: Emergency Medicine

## 2021-01-15 ENCOUNTER — Other Ambulatory Visit: Payer: Self-pay

## 2021-01-15 DIAGNOSIS — R110 Nausea: Secondary | ICD-10-CM | POA: Insufficient documentation

## 2021-01-15 DIAGNOSIS — R002 Palpitations: Secondary | ICD-10-CM | POA: Diagnosis not present

## 2021-01-15 DIAGNOSIS — I10 Essential (primary) hypertension: Secondary | ICD-10-CM | POA: Insufficient documentation

## 2021-01-15 DIAGNOSIS — R000 Tachycardia, unspecified: Secondary | ICD-10-CM | POA: Insufficient documentation

## 2021-01-15 DIAGNOSIS — F17210 Nicotine dependence, cigarettes, uncomplicated: Secondary | ICD-10-CM | POA: Diagnosis not present

## 2021-01-15 DIAGNOSIS — R0789 Other chest pain: Secondary | ICD-10-CM

## 2021-01-15 DIAGNOSIS — R072 Precordial pain: Secondary | ICD-10-CM | POA: Insufficient documentation

## 2021-01-15 DIAGNOSIS — R0602 Shortness of breath: Secondary | ICD-10-CM | POA: Insufficient documentation

## 2021-01-15 LAB — CBC
HCT: 35.8 % — ABNORMAL LOW (ref 36.0–46.0)
Hemoglobin: 12.3 g/dL (ref 12.0–15.0)
MCH: 28.9 pg (ref 26.0–34.0)
MCHC: 34.4 g/dL (ref 30.0–36.0)
MCV: 84 fL (ref 80.0–100.0)
Platelets: 297 10*3/uL (ref 150–400)
RBC: 4.26 MIL/uL (ref 3.87–5.11)
RDW: 13.1 % (ref 11.5–15.5)
WBC: 7.3 10*3/uL (ref 4.0–10.5)
nRBC: 0 % (ref 0.0–0.2)

## 2021-01-15 LAB — BASIC METABOLIC PANEL
Anion gap: 10 (ref 5–15)
BUN: 12 mg/dL (ref 6–20)
CO2: 20 mmol/L — ABNORMAL LOW (ref 22–32)
Calcium: 9.4 mg/dL (ref 8.9–10.3)
Chloride: 107 mmol/L (ref 98–111)
Creatinine, Ser: 0.6 mg/dL (ref 0.44–1.00)
GFR, Estimated: >60 mL/min (ref >60)
Glucose, Bld: 104 mg/dL — ABNORMAL HIGH (ref 70–99)
Potassium: 3.6 mmol/L (ref 3.5–5.1)
Sodium: 137 mmol/L (ref 135–145)

## 2021-01-15 LAB — TROPONIN I (HIGH SENSITIVITY)
Troponin I (High Sensitivity): <2 ng/L (ref <18)
Troponin I (High Sensitivity): <2 ng/L (ref <18)

## 2021-01-15 MED ORDER — IOHEXOL 350 MG/ML SOLN
100.0000 mL | Freq: Once | INTRAVENOUS | Status: AC | PRN
  Administered 2021-01-15: 100 mL via INTRAVENOUS

## 2021-01-15 MED ORDER — MORPHINE SULFATE (PF) 4 MG/ML IV SOLN
4.0000 mg | Freq: Once | INTRAVENOUS | Status: AC
Start: 2021-01-15 — End: 2021-01-15
  Administered 2021-01-15: 4 mg via INTRAVENOUS
  Filled 2021-01-15: qty 1

## 2021-01-15 MED ORDER — CYCLOBENZAPRINE HCL 10 MG PO TABS: 10.0000 mg | ORAL_TABLET | Freq: Two times a day (BID) | ORAL | 0 refills | Status: DC | PRN

## 2021-01-15 MED ORDER — LIDOCAINE 5 % EX PTCH: 1.0000 | MEDICATED_PATCH | CUTANEOUS | 0 refills | Status: DC

## 2021-01-15 NOTE — ED Notes (Signed)
Urine sample, blue top in lab to hold

## 2021-01-15 NOTE — Discharge Instructions (Signed)
LungYour work-up today was overall reassuring.  Your x-ray and subsequent CT scan did not show evidence of or blood clot.  Your tenderness on exam was consistent with chest wall discomfort.  Please use the muscle relaxant and Lidoderm patches to help with the discomfort and please follow-up with your breast team and PCP.  If any symptoms change or worsen, please return to the nearest emergency department.
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
## 2021-05-03 ENCOUNTER — Emergency Department (HOSPITAL_BASED_OUTPATIENT_CLINIC_OR_DEPARTMENT_OTHER)
Admission: EM | Admit: 2021-05-03 | Discharge: 2021-05-03 | Disposition: A | Discharge status: Home / Self Care | Payer: Commercial Managed Care - PPO | Attending: Emergency Medicine | Admitting: Emergency Medicine

## 2021-05-03 ENCOUNTER — Encounter (HOSPITAL_BASED_OUTPATIENT_CLINIC_OR_DEPARTMENT_OTHER): Payer: Self-pay | Admitting: *Deleted

## 2021-05-03 ENCOUNTER — Emergency Department (HOSPITAL_BASED_OUTPATIENT_CLINIC_OR_DEPARTMENT_OTHER): Payer: Commercial Managed Care - PPO

## 2021-05-03 ENCOUNTER — Other Ambulatory Visit: Payer: Self-pay

## 2021-05-03 DIAGNOSIS — Y92410 Unspecified street and highway as the place of occurrence of the external cause: Secondary | ICD-10-CM | POA: Diagnosis not present

## 2021-05-03 DIAGNOSIS — S8391XA Sprain of unspecified site of right knee, initial encounter: Secondary | ICD-10-CM

## 2021-05-03 DIAGNOSIS — F17210 Nicotine dependence, cigarettes, uncomplicated: Secondary | ICD-10-CM | POA: Insufficient documentation

## 2021-05-03 DIAGNOSIS — I10 Essential (primary) hypertension: Secondary | ICD-10-CM | POA: Diagnosis not present

## 2021-05-03 DIAGNOSIS — S8991XA Unspecified injury of right lower leg, initial encounter: Secondary | ICD-10-CM | POA: Diagnosis present

## 2021-05-03 LAB — CBC WITH DIFFERENTIAL/PLATELET
Abs Immature Granulocytes: 0.02 10*3/uL (ref 0.00–0.07)
Basophils Absolute: 0 10*3/uL (ref 0.0–0.1)
Basophils Relative: 1 %
Eosinophils Absolute: 0 10*3/uL (ref 0.0–0.5)
Eosinophils Relative: 1 %
HCT: 34.5 % — ABNORMAL LOW (ref 36.0–46.0)
Hemoglobin: 11.8 g/dL — ABNORMAL LOW (ref 12.0–15.0)
Immature Granulocytes: 0 %
Lymphocytes Relative: 43 %
Lymphs Abs: 2.4 10*3/uL (ref 0.7–4.0)
MCH: 29.4 pg (ref 26.0–34.0)
MCHC: 34.2 g/dL (ref 30.0–36.0)
MCV: 85.8 fL (ref 80.0–100.0)
Monocytes Absolute: 0.6 10*3/uL (ref 0.1–1.0)
Monocytes Relative: 10 %
Neutro Abs: 2.5 10*3/uL (ref 1.7–7.7)
Neutrophils Relative %: 45 %
Platelets: 297 10*3/uL (ref 150–400)
RBC: 4.02 MIL/uL (ref 3.87–5.11)
RDW: 12.3 % (ref 11.5–15.5)
WBC: 5.6 10*3/uL (ref 4.0–10.5)
nRBC: 0 % (ref 0.0–0.2)

## 2021-05-03 LAB — COMPREHENSIVE METABOLIC PANEL
ALT: 8 U/L (ref 0–44)
AST: 11 U/L — ABNORMAL LOW (ref 15–41)
Albumin: 3.5 g/dL (ref 3.5–5.0)
Alkaline Phosphatase: 87 U/L (ref 38–126)
Anion gap: 5 (ref 5–15)
BUN: 8 mg/dL (ref 6–20)
CO2: 22 mmol/L (ref 22–32)
Calcium: 8.9 mg/dL (ref 8.9–10.3)
Chloride: 108 mmol/L (ref 98–111)
Creatinine, Ser: 0.75 mg/dL (ref 0.44–1.00)
GFR, Estimated: >60 mL/min (ref >60)
Glucose, Bld: 82 mg/dL (ref 70–99)
Potassium: 3.5 mmol/L (ref 3.5–5.1)
Sodium: 135 mmol/L (ref 135–145)
Total Bilirubin: 0.2 mg/dL — ABNORMAL LOW (ref 0.3–1.2)
Total Protein: 6.8 g/dL (ref 6.5–8.1)

## 2021-05-03 LAB — URINALYSIS, ROUTINE W REFLEX MICROSCOPIC
Bilirubin Urine: NEGATIVE
Glucose, UA: NEGATIVE mg/dL
Hgb urine dipstick: NEGATIVE
Ketones, ur: NEGATIVE mg/dL
Leukocytes,Ua: NEGATIVE
Nitrite: NEGATIVE
Protein, ur: NEGATIVE mg/dL
Specific Gravity, Urine: 1.02 (ref 1.005–1.030)
pH: 6 (ref 5.0–8.0)

## 2021-05-03 MED ORDER — METHOCARBAMOL 500 MG PO TABS: 500.0000 mg | ORAL_TABLET | Freq: Four times a day (QID) | ORAL | 0 refills | Status: DC

## 2021-05-03 MED ORDER — DICLOFENAC SODIUM 75 MG PO TBEC: 75.0000 mg | DELAYED_RELEASE_TABLET | Freq: Two times a day (BID) | ORAL | 0 refills | Status: AC
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
## 2021-05-03 NOTE — ED Notes (Signed)
ED Provider at bedside. 

## 2021-06-09 ENCOUNTER — Encounter (HOSPITAL_BASED_OUTPATIENT_CLINIC_OR_DEPARTMENT_OTHER): Payer: Self-pay | Admitting: *Deleted

## 2021-06-09 ENCOUNTER — Emergency Department (HOSPITAL_BASED_OUTPATIENT_CLINIC_OR_DEPARTMENT_OTHER)
Admission: EM | Admit: 2021-06-09 | Discharge: 2021-06-09 | Disposition: A | Discharge status: Home / Self Care | Payer: Medicaid Other | Attending: Emergency Medicine | Admitting: Emergency Medicine

## 2021-06-09 ENCOUNTER — Other Ambulatory Visit: Payer: Self-pay

## 2021-06-09 DIAGNOSIS — M25562 Pain in left knee: Secondary | ICD-10-CM | POA: Diagnosis not present

## 2021-06-09 DIAGNOSIS — M25561 Pain in right knee: Secondary | ICD-10-CM

## 2021-06-09 DIAGNOSIS — I10 Essential (primary) hypertension: Secondary | ICD-10-CM | POA: Diagnosis not present

## 2021-06-09 DIAGNOSIS — F17210 Nicotine dependence, cigarettes, uncomplicated: Secondary | ICD-10-CM | POA: Diagnosis not present

## 2021-06-09 MED ORDER — NAPROXEN 500 MG PO TABS: 500.0000 mg | ORAL_TABLET | Freq: Two times a day (BID) | ORAL | 0 refills | Status: DC
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
## 2021-06-09 NOTE — Discharge Instructions (Addendum)
Begin taking naproxen as prescribed.  Follow-up in the orthopedic/sports medicine clinic in the next week if not improving.  The contact information for Dr. Raeford Razor has been provided in this discharge summary for you to call and make these arrangements.

## 2021-06-20 ENCOUNTER — Ambulatory Visit (INDEPENDENT_AMBULATORY_CARE_PROVIDER_SITE_OTHER): Payer: Medicaid Other | Admitting: Family Medicine

## 2021-06-20 ENCOUNTER — Other Ambulatory Visit: Payer: Self-pay

## 2021-06-20 ENCOUNTER — Encounter: Payer: Self-pay | Admitting: Family Medicine

## 2021-06-20 ENCOUNTER — Ambulatory Visit: Payer: Self-pay

## 2021-06-20 VITALS — BP 118/82 | Ht 65.0 in | Wt 140.0 lb

## 2021-06-20 DIAGNOSIS — M222X2 Patellofemoral disorders, left knee: Secondary | ICD-10-CM

## 2021-06-20 DIAGNOSIS — M222X1 Patellofemoral disorders, right knee: Secondary | ICD-10-CM

## 2021-06-20 MED ORDER — PREDNISONE 5 MG PO TABS: ORAL_TABLET | ORAL | 0 refills | Status: DC

## 2021-06-20 NOTE — Assessment & Plan Note (Signed)
Symptoms most consistent with patellofemoral pain.  Imaging is reviewed and reassuring. -Counseled on home exercise therapy and supportive care. -Prednisone. -Referral to physical therapy. -Could consider injection or further imaging.

## 2021-06-20 NOTE — Progress Notes (Signed)
Tiffany Christensen - 40 y.o. female MRN [id number]  Date of birth: [birth year]  SUBJECTIVE:  Including CC & ROS.  No chief complaint on file.   Tiffany Christensen is a 40 y.o. female that is presenting with bilateral knee pain.  The pain is anterior in nature.  Seems to be getting worse.  Has been ongoing for a few weeks.  No improvement with bracing.  It is worse with getting up from a seated position or going up or down stairs..  Independent review of the right knee x-ray from 5/25 shows no acute changes.   Review of Systems See HPI   HISTORY: Past Medical, Surgical, Social, and Family History Reviewed & Updated per EMR.   Pertinent Historical Findings include:  Past Medical History:  Diagnosis Date   Barrett's esophagus    Gastric ulcer    Hypertension    Migraine    Ovarian cyst    Pneumothorax on right    Uterine fibroid     Past Surgical History:  Procedure Laterality Date   ABDOMINAL HYSTERECTOMY     BREAST LUMPECTOMY     BREAST SURGERY     CARPAL TUNNEL RELEASE     ESOPHAGOGASTRODUODENOSCOPY N/A 12/07/2016   Procedure: ESOPHAGOGASTRODUODENOSCOPY (EGD);  Surgeon: Doran Stabler, MD;  Location: [facility] ENDOSCOPY;  Service: Endoscopy;  Laterality: N/A;   HAND SURGERY Right    hemmorhoidectomy     HEMORRHOID SURGERY     HERNIA REPAIR      Family History  Problem Relation Age of Onset   Hypertension Maternal Grandmother    Cancer Sister     Social History   Socioeconomic History   Marital status: Single    Spouse name: Not on file   Number of children: Not on file   Years of education: Not on file   Highest education level: Not on file  Occupational History   Not on file  Tobacco Use   Smoking status: Every Day    Packs/day: 0.50    Pack years: 0.00    Types: Cigarettes   Smokeless tobacco: Never   Tobacco comments:    Quit x 2 weeks ago  Vaping Use   Vaping Use: Never used  Substance and Sexual Activity   Alcohol use: No   Drug use: No   Sexual activity:  Yes    Birth control/protection: Surgical  Other Topics Concern   Not on file  Social History Narrative   Not on file   Social Determinants of Health   Financial Resource Strain: Not on file  Food Insecurity: Not on file  Transportation Needs: Not on file  Physical Activity: Not on file  Stress: Not on file  Social Connections: Not on file  Intimate Partner Violence: Not on file     PHYSICAL EXAM:  VS: BP 118/82 (BP Location: Right Arm, Patient Position: Sitting, Cuff Size: Normal)   Ht 5\' 5"  (1.651 m)   Wt 140 lb (63.5 kg)   LMP 11/08/2016 (Exact Date) Comment: neg upreg  BMI 23.30 kg/m  Physical Exam Gen: NAD, alert, cooperative with exam, well-appearing MSK:  Right and left knee: Normal range of motion. Normal strength resistance. Neurovascular intact  Limited ultrasound: Right knee, left knee:  Right knee: Mild effusion. Normal-appearing quadricep and patellar tendon. Normal-appearing medial joint space and lateral joint space.  Left knee: No effusion  Normal-appearing quadricep and patellar tendon. Normal-appearing medial joint space and lateral joint space.  Summary: no significant structural changes  Ultrasound and interpretation by Clearance Coots, MD    ASSESSMENT & PLAN:   Patellofemoral pain syndrome of both knees Symptoms most consistent with patellofemoral pain.  Imaging is reviewed and reassuring. -Counseled on home exercise therapy and supportive care. -Prednisone. -Referral to physical therapy. -Could consider injection or further imaging.

## 2021-06-20 NOTE — Patient Instructions (Signed)
Nice to meet you Please try ice  Please try the exercises  Please try physical therapy   Please send me a message in MyChart with any questions or updates.  Please see me back in 4 weeks.   --Dr. Raeford Razor

## 2021-07-02 ENCOUNTER — Other Ambulatory Visit: Payer: Self-pay

## 2021-07-02 ENCOUNTER — Encounter (HOSPITAL_BASED_OUTPATIENT_CLINIC_OR_DEPARTMENT_OTHER): Payer: Self-pay | Admitting: Emergency Medicine

## 2021-07-02 ENCOUNTER — Emergency Department (HOSPITAL_BASED_OUTPATIENT_CLINIC_OR_DEPARTMENT_OTHER): Payer: Medicaid Other

## 2021-07-02 DIAGNOSIS — F17210 Nicotine dependence, cigarettes, uncomplicated: Secondary | ICD-10-CM | POA: Insufficient documentation

## 2021-07-02 DIAGNOSIS — T8189XA Other complications of procedures, not elsewhere classified, initial encounter: Secondary | ICD-10-CM | POA: Insufficient documentation

## 2021-07-02 DIAGNOSIS — Y828 Other medical devices associated with adverse incidents: Secondary | ICD-10-CM | POA: Insufficient documentation

## 2021-07-02 DIAGNOSIS — I10 Essential (primary) hypertension: Secondary | ICD-10-CM | POA: Insufficient documentation

## 2021-07-02 NOTE — ED Triage Notes (Signed)
Pt reports double mastectomy 06/22/21; RT tube not draining x today

## 2021-07-03 ENCOUNTER — Emergency Department (HOSPITAL_BASED_OUTPATIENT_CLINIC_OR_DEPARTMENT_OTHER)
Admission: EM | Admit: 2021-07-03 | Discharge: 2021-07-03 | Disposition: A | Discharge status: Home / Self Care | Payer: Medicaid Other | Attending: Emergency Medicine | Admitting: Emergency Medicine

## 2021-07-03 ENCOUNTER — Encounter (HOSPITAL_BASED_OUTPATIENT_CLINIC_OR_DEPARTMENT_OTHER): Payer: Self-pay | Admitting: Emergency Medicine

## 2021-07-03 DIAGNOSIS — Z5189 Encounter for other specified aftercare: Secondary | ICD-10-CM

## 2021-07-03 MED ORDER — OXYCODONE-ACETAMINOPHEN 5-325 MG PO TABS
1.0000 | ORAL_TABLET | ORAL | Status: AC
  Administered 2021-07-03: 1 via ORAL
  Filled 2021-07-03: qty 1

## 2021-07-03 MED ORDER — OXYCODONE HCL 5 MG PO TABS: 5.0000 mg | ORAL_TABLET | ORAL | Status: DC

## 2021-07-03 NOTE — ED Notes (Signed)
Wound on right abdomen reinforced with nonadherent bandage and several 4x4.

## 2021-07-03 NOTE — ED Provider Notes (Signed)
[facility] Provider Note   CSN: [id number] Arrival date & time: 07/02/21  2000     History Chief Complaint  Patient presents with   Post-op Problem    Tiffany Christensen is a 40 y.o. female.  The history is provided by the patient.  Illness Location:  Right chest drain post mastectomy Quality:  Drainage at skin, serosanguinous Severity:  Mild Onset quality:  Gradual Duration:  1 day Timing:  Constant Progression:  Unchanged Chronicity:  New Context:  Post mastectomy Relieved by:  Nothing Worsened by:  Nothing Ineffective treatments:  None tried Associated symptoms: no chest pain, no fever, no rash, no vomiting and no wheezing   Risk factors:  Post op S/p mastectomy at [facility] on 7/19 with serosanguinous drainage at skin at JP drain insertion on the right.  No f/c/r.      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   Menorrhagia 12/08/2016   Anemia due to chronic blood loss 12/08/2016   Chronic gastric ulcer without hemorrhage and without perforation    Left sided abdominal pain    Iron deficiency anemia due to chronic blood loss 12/05/2016    Past Surgical History:  Procedure Laterality Date   ABDOMINAL HYSTERECTOMY     BREAST LUMPECTOMY     BREAST SURGERY     CARPAL TUNNEL RELEASE     ESOPHAGOGASTRODUODENOSCOPY N/A 12/07/2016   Procedure: ESOPHAGOGASTRODUODENOSCOPY (EGD);  Surgeon: Doran Stabler, MD;  Location: [facility] ENDOSCOPY;  Service: Endoscopy;  Laterality: N/A;   HAND SURGERY Right    hemmorhoidectomy     HEMORRHOID SURGERY     HERNIA REPAIR       OB History   No obstetric history on file.     Family History  Problem Relation Age of Onset    Hypertension Maternal Grandmother    Cancer Sister     Social History   Tobacco Use   Smoking status: Every Day    Packs/day: 0.50    Types: Cigarettes   Smokeless tobacco: Never   Tobacco comments:    Quit x 2 weeks ago  Vaping Use   Vaping Use: Never used  Substance Use Topics   Alcohol use: No   Drug use: No    Home Medications Prior to Admission medications   Medication Sig Start Date End Date Taking? Authorizing Provider  amitriptyline (ELAVIL) 50 MG tablet Take 50 mg by mouth at bedtime.    [provider]  cyclobenzaprine (FLEXERIL) 10 MG tablet Take 1 tablet (10 mg total) by mouth 2 (two) times daily as needed for muscle spasms. 11/23/20   Curatolo, Adam, DO  cyclobenzaprine (FLEXERIL) 10 MG tablet Take 1 tablet (10 mg total) by mouth 2 (two) times daily as needed for muscle spasms. 01/15/21   Tegeler, Gwenyth Allegra, MD  diclofenac (VOLTAREN) 75 MG EC tablet Take 1 tablet (75 mg total) by mouth 2 (two) times daily. 05/03/21   Fransico Meadow, PA-C  HYDROcodone-acetaminophen (NORCO/VICODIN) 5-325 MG tablet Take 1 tablet by mouth every 6 (six) hours as needed for severe pain. 11/23/20   Ripley Fraise, MD  lidocaine ([place])  5 % Place 1 patch onto the skin daily. Remove & Discard patch within 12 hours or as directed by MD 01/15/21   Tegeler, Gwenyth Allegra, MD  methocarbamol (ROBAXIN) 500 MG tablet Take 1 tablet (500 mg total) by mouth 4 (four) times daily. 05/03/21   Fransico Meadow, PA-C  naproxen (NAPROSYN) 500 MG tablet Take 1 tablet (500 mg total) by mouth 2 (two) times daily. 06/09/21   Veryl Speak, MD  pantoprazole (PROTONIX) 20 MG tablet Take 1 tablet (20 mg total) by mouth daily. 06/26/20   Lorin Glass, PA-C  predniSONE (DELTASONE) 5 MG tablet Take 6 pills for first day, 5 pills second day, 4 pills third day, 3 pills fourth day, 2 pills the fifth day, and 1 pill sixth day. 06/20/21   Rosemarie Ax, MD  SUMAtriptan (IMITREX) 50 MG tablet Take 50 mg by  mouth every 2 (two) hours as needed for migraine or headache.  Patient not taking: Reported on 06/21/2020 05/06/20 10/18/20  [provider]  zonisamide (ZONEGRAN) 100 MG capsule  02/16/20 10/18/20  [provider]    Allergies    Patient has no known allergies.  Review of Systems   Review of Systems  Constitutional:  Negative for fever.  HENT:  Negative for facial swelling.   Eyes:  Negative for redness.  Respiratory:  Negative for wheezing.   Cardiovascular:  Negative for chest pain.  Gastrointestinal:  Negative for vomiting.  Genitourinary:  Negative for difficulty urinating.  Skin:  Negative for rash.  Neurological:  Negative for facial asymmetry.  Psychiatric/Behavioral:  Negative for agitation.   All other systems reviewed and are negative.  Physical Exam Updated Vital Signs BP (!) 125/99   Pulse 84   Temp 98.7 F (37.1 C) (Oral)   Resp 18   Ht '5\' 5"'$  (1.651 m)   Wt 63.5 kg   LMP 11/08/2016 (Exact Date) Comment: neg upreg  SpO2 100%   BMI 23.30 kg/m   Physical Exam Vitals and nursing note reviewed.  Constitutional:      General: She is not in acute distress.    Appearance: Normal appearance.  HENT:     Head: Normocephalic and atraumatic.     Nose: Nose normal.  Eyes:     Conjunctiva/sclera: Conjunctivae normal.     Pupils: Pupils are equal, round, and reactive to light.  Cardiovascular:     Rate and Rhythm: Normal rate and regular rhythm.     Pulses: Normal pulses.     Heart sounds: Normal heart sounds.  Pulmonary:     Effort: Pulmonary effort is normal.     Breath sounds: Normal breath sounds.     Comments: 25 Ml serosaguinous drainage in each JP drain, scant serosanguinous drainage on cloth at the skin Abdominal:     General: Abdomen is flat. Bowel sounds are normal.     Palpations: Abdomen is soft.     Tenderness: There is no abdominal tenderness. There is no guarding or rebound.  Musculoskeletal:        General: Normal range of motion.      Cervical back: Normal range of motion and neck supple.  Skin:    General: Skin is warm and dry.     Capillary Refill: Capillary refill takes less than 2 seconds.  Neurological:     General: No focal deficit present.     Mental Status: She is alert and oriented to person, place, and time.     Deep Tendon Reflexes:  Reflexes normal.  Psychiatric:        Mood and Affect: Mood normal.        Behavior: Behavior normal.    ED Results / Procedures / Treatments   Labs (all labs ordered are listed, but only abnormal results are displayed) Labs Reviewed - No data to display  EKG None  Radiology DG Chest 2 View  Result Date: 07/03/2021 CLINICAL DATA:  Postop, double mastectomy EXAM: CHEST - 2 VIEW COMPARISON:  Radiograph 01/15/2021, CT 01/15/2021 FINDINGS: Postoperative radiograph from double mastectomy with tissue expanders in place. Bilateral surgical drains in place. Small amount soft tissue gas seen along the right chest wall. No left subcutaneous emphysema. Lungs are clear. Cardiomediastinal contours are unremarkable. No acute osseous abnormalities. IMPRESSION: Postsurgical changes from bilateral mastectomy with tissue expanders and surgical drains in place. Small amount of soft tissue gas along the right chest wall, nonspecific in the postoperative setting. Correlate with surgical site assessment. No acute cardiopulmonary abnormality. Electronically Signed   By: Lovena Le M.D.   On: 07/03/2021 00:11    Procedures Procedures   Medications Ordered in ED Medications  oxyCODONE-acetaminophen (PERCOCET/ROXICET) 5-325 MG per tablet 1 tablet (1 tablet Oral Given 07/03/21 0346)    ED Course  I have reviewed the triage vital signs and the nursing notes.  Pertinent labs & imaging results that were available during my care of the patient were reviewed by me and considered in my medical decision making (see chart for details).   450 case d/w Dr. Delice Lesch, patient's surgeon, if black mark is  not out of skin, dress area and they will follow up with the patient.   Black mark is not outside the skin and there is drainage in the conduit with no fluctuance or crepitance at the skin and no active drainage at this time  Follow up with your surgeon.    Tiffany Christensen was evaluated in Emergency Department on 07/03/2021 for the symptoms described in the history of present illness. She was evaluated in the context of the global COVID-19 pandemic, which necessitated consideration that the patient might be at risk for infection with the SARS-CoV-2 virus that causes COVID-19. Institutional protocols and algorithms that pertain to the evaluation of patients at risk for COVID-19 are in a state of rapid change based on information released by regulatory bodies including the CDC and federal and state organizations. These policies and algorithms were followed during the patient's care in the ED.   Final Clinical Impression(s) / ED Diagnoses Final diagnoses:  None   Return for intractable cough, coughing up blood, fevers > 100.4 unrelieved by medication, shortness of breath, intractable vomiting, chest pain, shortness of breath, weakness, numbness, changes in speech, facial asymmetry, abdominal pain, passing out, Inability to tolerate liquids or food, cough, altered mental status or any concerns. No signs of systemic illness or infection. The patient is nontoxic-appearing on exam and vital signs are within normal limits. I have reviewed the triage vital signs and the nursing notes. Pertinent labs & imaging results that were available during my care of the patient were reviewed by me and considered in my medical decision making (see chart for details). After history, exam, and medical workup I feel the patient has been appropriately medically screened and is safe for discharge home. Pertinent diagnoses were discussed with the patient. Patient was given return precautions. Rx / DC Orders ED Discharge Orders      None        Tripton Ned, MD  07/03/21 0457  

## 2021-07-24 ENCOUNTER — Ambulatory Visit: Payer: Medicaid Other | Admitting: Family Medicine

## 2021-07-24 NOTE — Progress Notes (Deleted)
  Tiffany Christensen - 40 y.o. female MRN [id number]  Date of birth: 1981-03-06  SUBJECTIVE:  Including CC & ROS.  No chief complaint on file.   Tiffany Christensen is a 40 y.o. female that is  ***.  ***   Review of Systems See HPI   HISTORY: Past Medical, Surgical, Social, and Family History Reviewed & Updated per EMR.   Pertinent Historical Findings include:  Past Medical History:  Diagnosis Date   Barrett's esophagus    Gastric ulcer    Hypertension    Migraine    Ovarian cyst    Pneumothorax on right    Uterine fibroid     Past Surgical History:  Procedure Laterality Date   ABDOMINAL HYSTERECTOMY     BREAST LUMPECTOMY     BREAST SURGERY     CARPAL TUNNEL RELEASE     ESOPHAGOGASTRODUODENOSCOPY N/A 12/07/2016   Procedure: ESOPHAGOGASTRODUODENOSCOPY (EGD);  Surgeon: Doran Stabler, MD;  Location: [facility] ENDOSCOPY;  Service: Endoscopy;  Laterality: N/A;   HAND SURGERY Right    hemmorhoidectomy     HEMORRHOID SURGERY     HERNIA REPAIR      Family History  Problem Relation Age of Onset   Hypertension Maternal Grandmother    Cancer Sister     Social History   Socioeconomic History   Marital status: Single    Spouse name: Not on file   Number of children: Not on file   Years of education: Not on file   Highest education level: Not on file  Occupational History   Not on file  Tobacco Use   Smoking status: Every Day    Packs/day: 0.50    Types: Cigarettes   Smokeless tobacco: Never   Tobacco comments:    Quit x 2 weeks ago  Vaping Use   Vaping Use: Never used  Substance and Sexual Activity   Alcohol use: No   Drug use: No   Sexual activity: Yes    Birth control/protection: Surgical  Other Topics Concern   Not on file  Social History Narrative   Not on file   Social Determinants of Health   Financial Resource Strain: Not on file  Food Insecurity: Not on file  Transportation Needs: Not on file  Physical Activity: Not on file  Stress: Not on file  Social  Connections: Not on file  Intimate Partner Violence: Not on file     PHYSICAL EXAM:  VS: LMP 11/08/2016 (Exact Date) Comment: neg upreg Physical Exam Gen: NAD, alert, cooperative with exam, well-appearing MSK:  ***      ASSESSMENT & PLAN:   No problem-specific Assessment & Plan notes found for this encounter.

## 2021-09-06 IMAGING — DX DG CHEST 1V PORT
1 series · 1 of 1 positions shown · non-contrast
Comparison: October 28, 2019

CLINICAL DATA: Chest pain.

EXAM:
PORTABLE CHEST 1 VIEW

[chest ap]
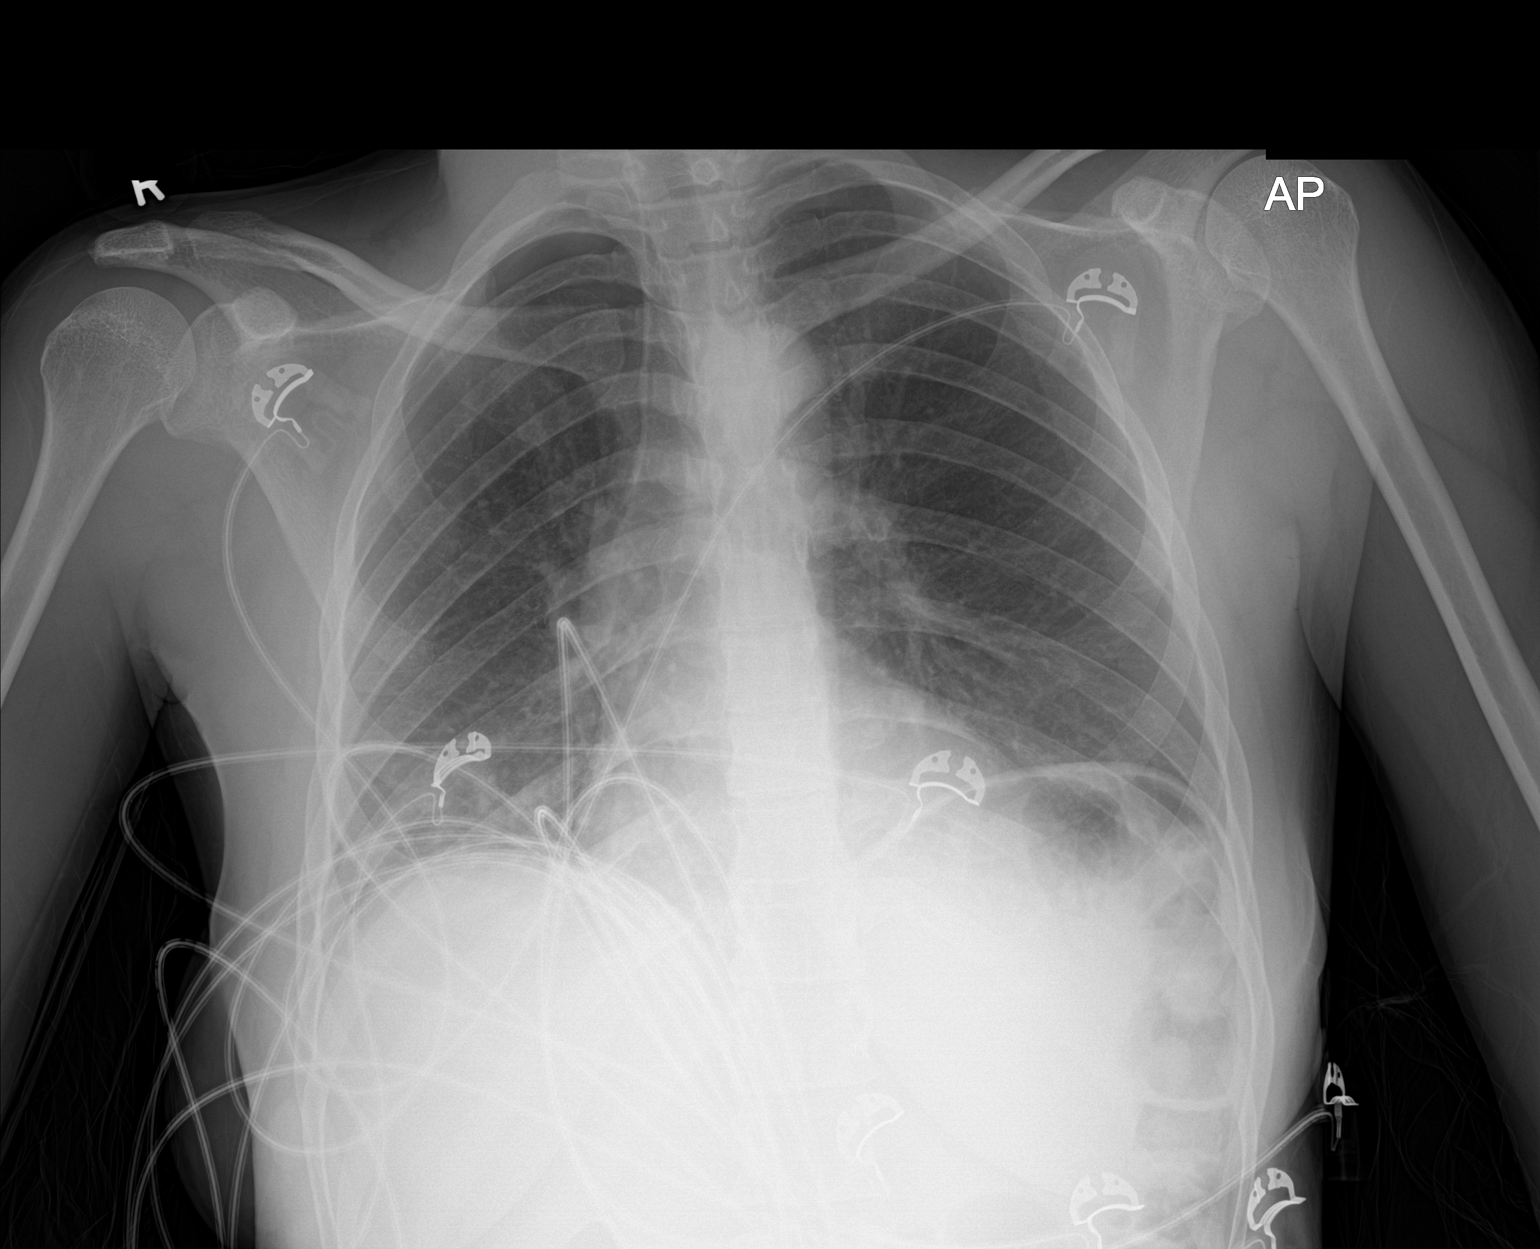

[1 of 1 positions shown; findings below may reference images not displayed]

FINDINGS: There are hazy airspace opacities at the lung bases bilaterally,
right greater than left. There is no pneumothorax. No convincing
pleural effusion. The heart size is stable from prior study.
IMPRESSION: Hazy airspace opacities at the lung bases bilaterally, right greater
than left, may represent atelectasis or developing infiltrates.

## 2021-09-09 IMAGING — CR DG CHEST 2V
2 series · 2 of 2 positions shown · non-contrast
Comparison: Radiograph and CT 06/19/2020

CLINICAL DATA: Pneumothorax.  Right chest pain.

EXAM:
CHEST - 2 VIEW

[w chest pa]
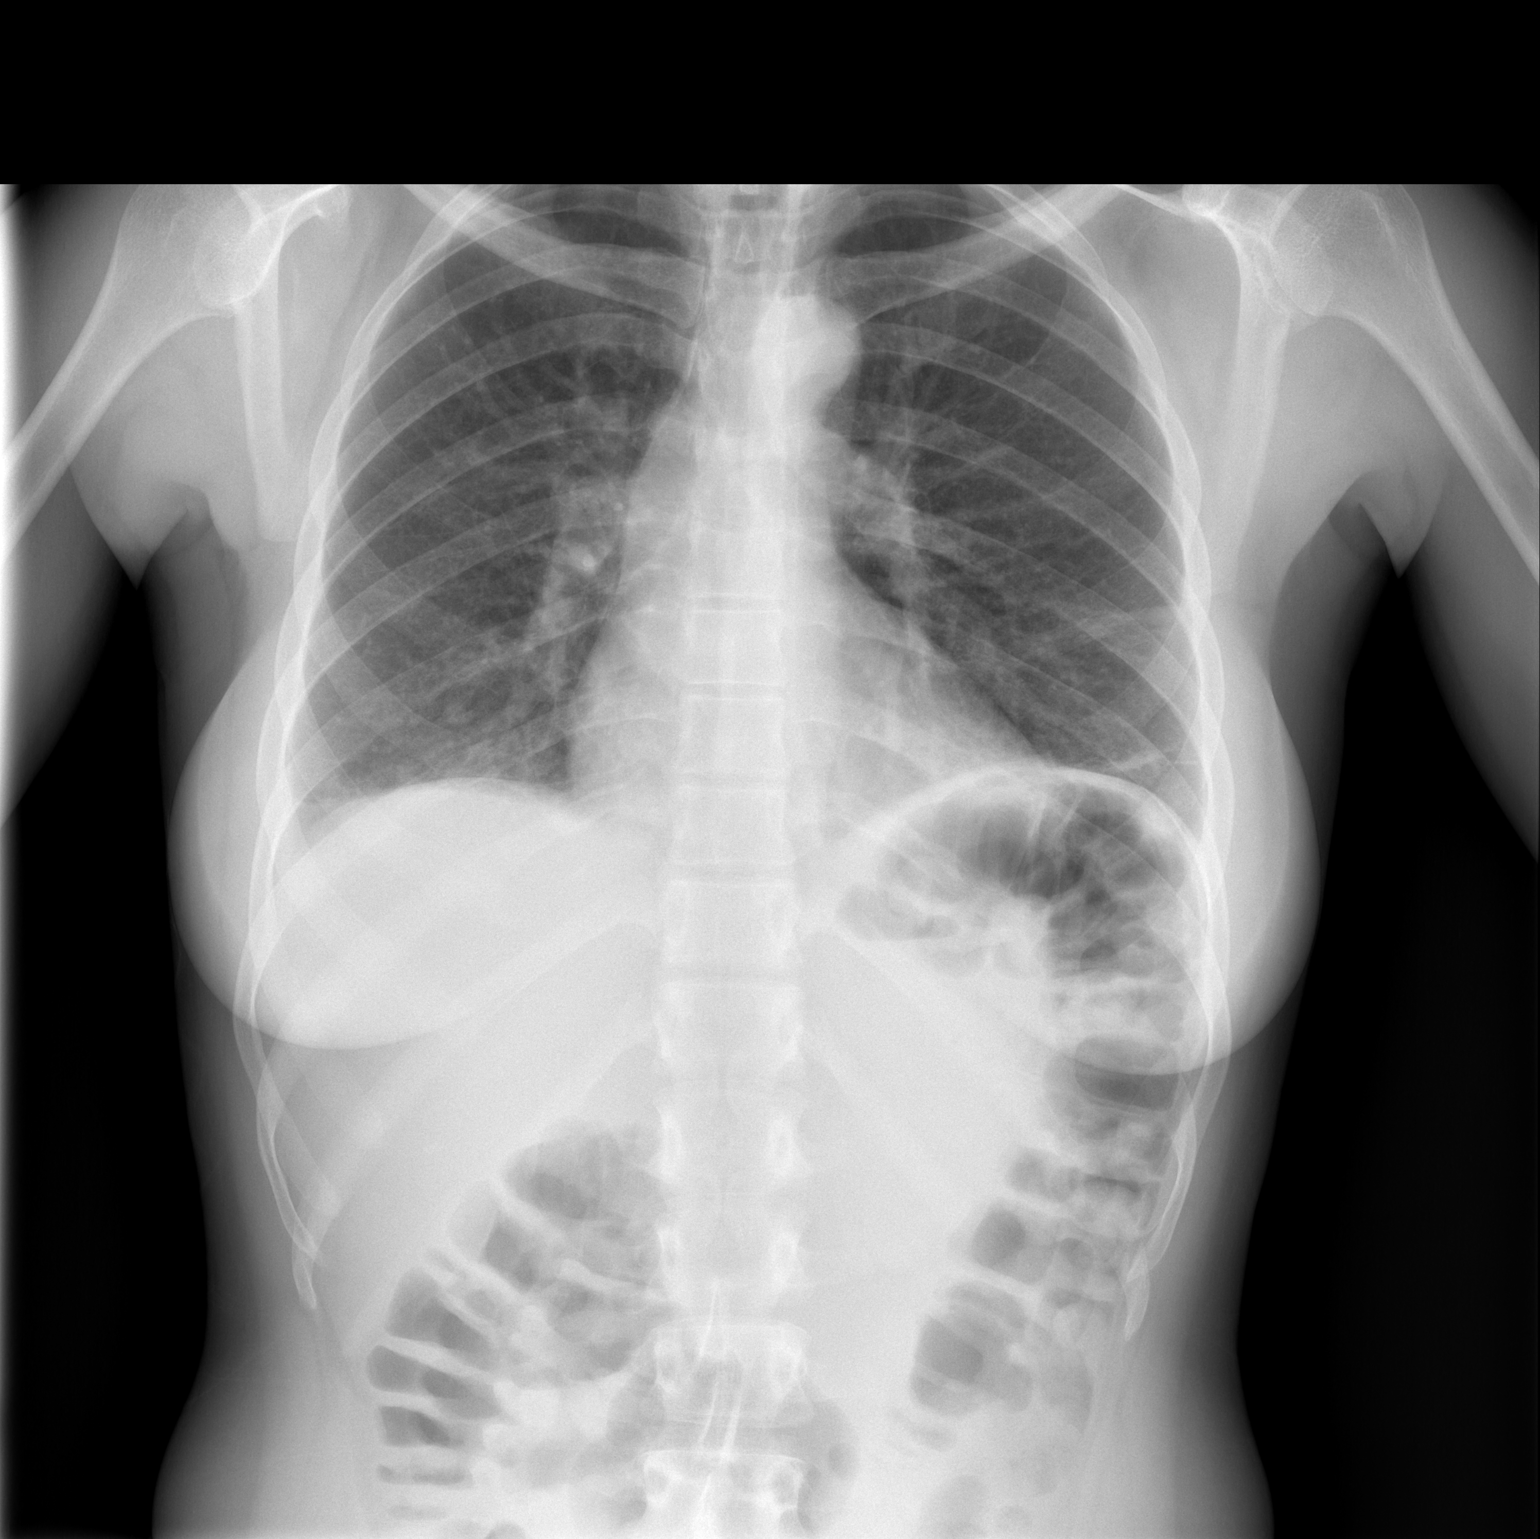

[w chest lat]
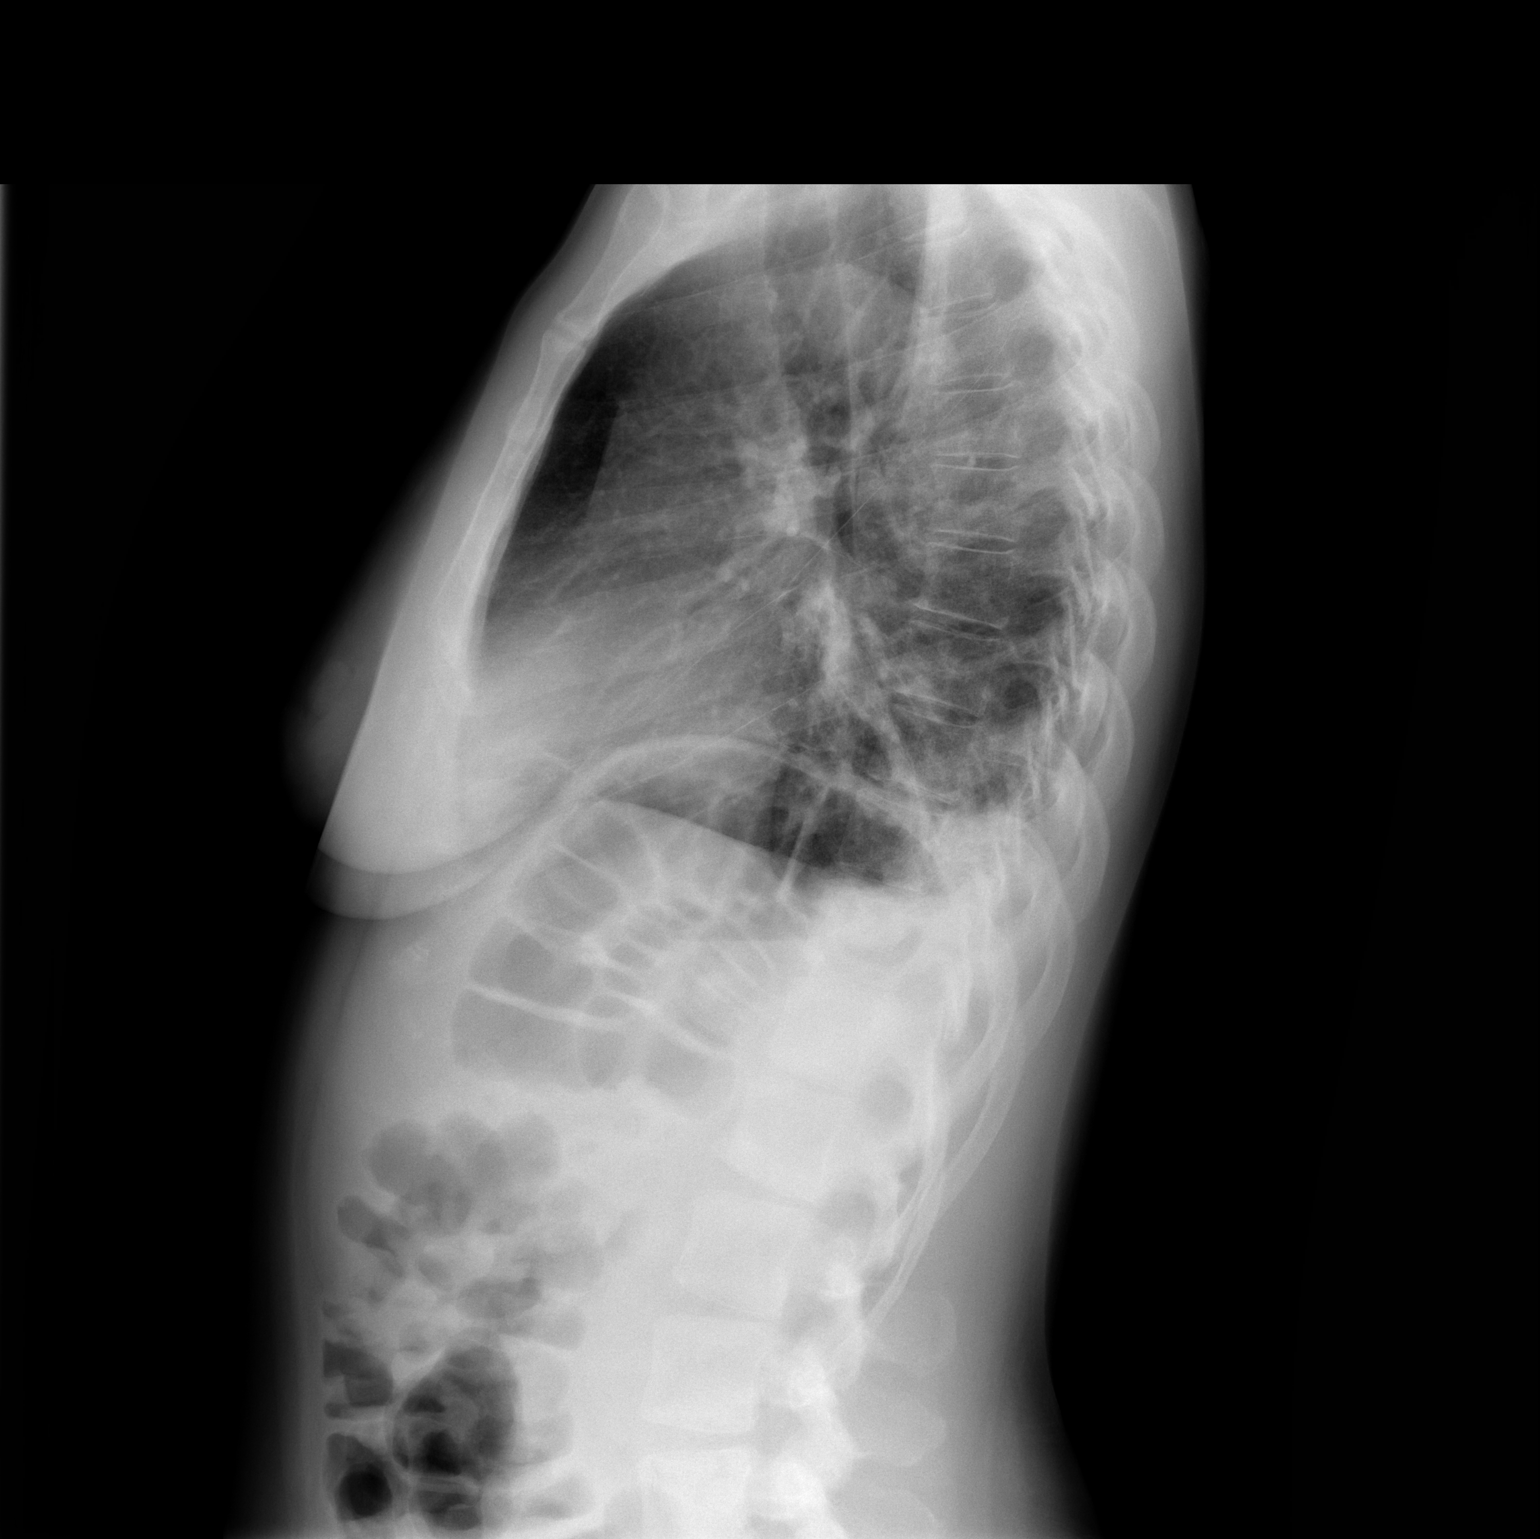

[2 of 2 positions shown; findings below may reference images not displayed]

FINDINGS: Decreased size of right pneumothorax from exam 2 days ago. Trace
apical component persists visualized under the right second rib.
Subsegmental atelectasis in the right lung base, left lower lobe and
left midlung zone. The heart is normal in size. Unchanged
mediastinal contours. No pleural effusion. No acute osseous
abnormalities are seen.
IMPRESSION: Decreased size of right pneumothorax from exam 2 days ago. Trace
apical component persists.

Scattered atelectasis.

## 2021-12-21 ENCOUNTER — Emergency Department (HOSPITAL_BASED_OUTPATIENT_CLINIC_OR_DEPARTMENT_OTHER): Payer: Medicaid Other

## 2021-12-21 ENCOUNTER — Encounter (HOSPITAL_BASED_OUTPATIENT_CLINIC_OR_DEPARTMENT_OTHER): Payer: Self-pay | Admitting: *Deleted

## 2021-12-21 ENCOUNTER — Other Ambulatory Visit (HOSPITAL_BASED_OUTPATIENT_CLINIC_OR_DEPARTMENT_OTHER): Payer: Self-pay

## 2021-12-21 ENCOUNTER — Emergency Department (HOSPITAL_BASED_OUTPATIENT_CLINIC_OR_DEPARTMENT_OTHER)
Admission: EM | Admit: 2021-12-21 | Discharge: 2021-12-21 | Disposition: A | Discharge status: Home / Self Care | Payer: Medicaid Other | Attending: Emergency Medicine | Admitting: Emergency Medicine

## 2021-12-21 DIAGNOSIS — R0789 Other chest pain: Secondary | ICD-10-CM | POA: Insufficient documentation

## 2021-12-21 DIAGNOSIS — M25511 Pain in right shoulder: Secondary | ICD-10-CM | POA: Insufficient documentation

## 2021-12-21 MED ORDER — KETOROLAC TROMETHAMINE 60 MG/2ML IM SOLN
60.0000 mg | Freq: Once | INTRAMUSCULAR | Status: AC
  Administered 2021-12-21: 60 mg via INTRAMUSCULAR
  Filled 2021-12-21: qty 2

## 2021-12-21 MED ORDER — METHOCARBAMOL 500 MG PO TABS
500.0000 mg | ORAL_TABLET | Freq: Three times a day (TID) | ORAL | 0 refills | Status: DC | PRN
  Filled 2021-12-21: qty 15, 5d supply, fill #0

## 2021-12-21 NOTE — Discharge Instructions (Signed)
X-rays are negative for fracture.  Overall suspect contusion/sprain.  Recommend 800 mg ibuprofen every 8 hours as needed for pain.  Recommend 1000 mg of Tylenol every 6 hours as needed for pain.  I have prescribed a muscle relaxant for you that you have used in the past called Robaxin.  Do not drive or do dangerous activities while taking this medicine as it is sedating.  Do not mix with alcohol or drugs.  Follow-up with your primary care doctor.
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
## 2021-12-21 NOTE — ED Notes (Signed)
Has full ROM of RUE, RUE color WNL, strong Rt radial pulses, good capillary refill, strong rt hand grip. Pain includes RUE, Rt Shoulder, and Neck. ED MD at bedside

## 2021-12-21 NOTE — ED Notes (Signed)
Patient transported to X-ray 

## 2022-01-03 ENCOUNTER — Encounter (HOSPITAL_BASED_OUTPATIENT_CLINIC_OR_DEPARTMENT_OTHER): Payer: Self-pay

## 2022-01-03 ENCOUNTER — Emergency Department (HOSPITAL_BASED_OUTPATIENT_CLINIC_OR_DEPARTMENT_OTHER)
Admission: EM | Admit: 2022-01-03 | Discharge: 2022-01-03 | Disposition: A | Discharge status: Home / Self Care | Payer: Medicaid Other | Attending: Emergency Medicine | Admitting: Emergency Medicine

## 2022-01-03 ENCOUNTER — Emergency Department (HOSPITAL_BASED_OUTPATIENT_CLINIC_OR_DEPARTMENT_OTHER): Payer: Medicaid Other

## 2022-01-03 ENCOUNTER — Other Ambulatory Visit: Payer: Self-pay

## 2022-01-03 DIAGNOSIS — R112 Nausea with vomiting, unspecified: Secondary | ICD-10-CM | POA: Insufficient documentation

## 2022-01-03 DIAGNOSIS — T59811A Toxic effect of smoke, accidental (unintentional), initial encounter: Secondary | ICD-10-CM | POA: Diagnosis not present

## 2022-01-03 DIAGNOSIS — Z87891 Personal history of nicotine dependence: Secondary | ICD-10-CM | POA: Diagnosis not present

## 2022-01-03 DIAGNOSIS — J705 Respiratory conditions due to smoke inhalation: Secondary | ICD-10-CM | POA: Insufficient documentation

## 2022-01-03 DIAGNOSIS — R0789 Other chest pain: Secondary | ICD-10-CM | POA: Diagnosis present

## 2022-01-03 DIAGNOSIS — R079 Chest pain, unspecified: Secondary | ICD-10-CM

## 2022-01-03 DIAGNOSIS — R748 Abnormal levels of other serum enzymes: Secondary | ICD-10-CM | POA: Diagnosis not present

## 2022-01-03 DIAGNOSIS — I10 Essential (primary) hypertension: Secondary | ICD-10-CM | POA: Diagnosis not present

## 2022-01-03 DIAGNOSIS — R110 Nausea: Secondary | ICD-10-CM

## 2022-01-03 LAB — CBC WITH DIFFERENTIAL/PLATELET
Abs Immature Granulocytes: 0.02 10*3/uL (ref 0.00–0.07)
Basophils Absolute: 0 10*3/uL (ref 0.0–0.1)
Basophils Relative: 1 %
Eosinophils Absolute: 0.1 10*3/uL (ref 0.0–0.5)
Eosinophils Relative: 1 %
HCT: 34.2 % — ABNORMAL LOW (ref 36.0–46.0)
Hemoglobin: 11.7 g/dL — ABNORMAL LOW (ref 12.0–15.0)
Immature Granulocytes: 0 %
Lymphocytes Relative: 35 %
Lymphs Abs: 2 10*3/uL (ref 0.7–4.0)
MCH: 28.3 pg (ref 26.0–34.0)
MCHC: 34.2 g/dL (ref 30.0–36.0)
MCV: 82.6 fL (ref 80.0–100.0)
Monocytes Absolute: 0.5 10*3/uL (ref 0.1–1.0)
Monocytes Relative: 8 %
Neutro Abs: 3.3 10*3/uL (ref 1.7–7.7)
Neutrophils Relative %: 55 %
Platelets: 366 10*3/uL (ref 150–400)
RBC: 4.14 MIL/uL (ref 3.87–5.11)
RDW: 12.6 % (ref 11.5–15.5)
WBC: 5.9 10*3/uL (ref 4.0–10.5)
nRBC: 0 % (ref 0.0–0.2)

## 2022-01-03 LAB — COMPREHENSIVE METABOLIC PANEL
ALT: 8 U/L (ref 0–44)
AST: 13 U/L — ABNORMAL LOW (ref 15–41)
Albumin: 3.7 g/dL (ref 3.5–5.0)
Alkaline Phosphatase: 100 U/L (ref 38–126)
Anion gap: 7 (ref 5–15)
BUN: 13 mg/dL (ref 6–20)
CO2: 21 mmol/L — ABNORMAL LOW (ref 22–32)
Calcium: 9.2 mg/dL (ref 8.9–10.3)
Chloride: 108 mmol/L (ref 98–111)
Creatinine, Ser: 0.65 mg/dL (ref 0.44–1.00)
GFR, Estimated: >60 mL/min (ref >60)
Glucose, Bld: 108 mg/dL — ABNORMAL HIGH (ref 70–99)
Potassium: 3.7 mmol/L (ref 3.5–5.1)
Sodium: 136 mmol/L (ref 135–145)
Total Bilirubin: 0.4 mg/dL (ref 0.3–1.2)
Total Protein: 7.3 g/dL (ref 6.5–8.1)

## 2022-01-03 LAB — LIPASE, BLOOD: Lipase: 80 U/L — ABNORMAL HIGH (ref 11–51)

## 2022-01-03 LAB — TROPONIN I (HIGH SENSITIVITY): Troponin I (High Sensitivity): <2 ng/L (ref <18)

## 2022-01-03 MED ORDER — ONDANSETRON HCL 4 MG/2ML IJ SOLN
4.0000 mg | Freq: Once | INTRAMUSCULAR | Status: AC
  Administered 2022-01-03: 17:00:00 4 mg via INTRAVENOUS
  Filled 2022-01-03: qty 2

## 2022-01-03 MED ORDER — LACTATED RINGERS IV BOLUS
1000.0000 mL | Freq: Once | INTRAVENOUS | Status: AC
  Administered 2022-01-03: 17:00:00 1000 mL via INTRAVENOUS

## 2022-01-03 MED ORDER — ONDANSETRON 4 MG PO TBDP: 4.0000 mg | ORAL_TABLET | Freq: Three times a day (TID) | ORAL | 0 refills | Status: DC | PRN

## 2022-01-03 MED ORDER — PROCHLORPERAZINE EDISYLATE 10 MG/2ML IJ SOLN
10.0000 mg | Freq: Once | INTRAMUSCULAR | Status: AC
  Administered 2022-01-03: 19:00:00 10 mg via INTRAVENOUS
  Filled 2022-01-03: qty 2

## 2022-01-03 MED ORDER — PANTOPRAZOLE SODIUM 20 MG PO TBEC: 20.0000 mg | DELAYED_RELEASE_TABLET | Freq: Every day | ORAL | 0 refills | Status: AC

## 2022-01-03 MED ORDER — DIPHENHYDRAMINE HCL 50 MG/ML IJ SOLN
25.0000 mg | Freq: Once | INTRAMUSCULAR | Status: AC
Start: 2022-01-03 — End: 2022-01-03
  Administered 2022-01-03: 19:00:00 25 mg via INTRAVENOUS
  Filled 2022-01-03: qty 1

## 2022-01-03 MED ORDER — MORPHINE SULFATE (PF) 4 MG/ML IV SOLN
4.0000 mg | Freq: Once | INTRAVENOUS | Status: AC
  Administered 2022-01-03: 17:00:00 4 mg via INTRAVENOUS
  Filled 2022-01-03: qty 1
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
## 2022-01-03 NOTE — ED Notes (Signed)
Portable COOX done at this time. Reading is 5. MD aware. No distress noted.
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
## 2022-01-03 NOTE — Progress Notes (Signed)
Placed on NRB by MD. RT to monitor.

## 2022-01-08 ENCOUNTER — Emergency Department (HOSPITAL_COMMUNITY): Payer: Medicaid Other

## 2022-01-08 ENCOUNTER — Emergency Department (HOSPITAL_COMMUNITY)
Admission: EM | Admit: 2022-01-08 | Discharge: 2022-01-08 | Disposition: A | Discharge status: Home / Self Care | Payer: Medicaid Other | Attending: Emergency Medicine | Admitting: Emergency Medicine

## 2022-01-08 DIAGNOSIS — R0789 Other chest pain: Secondary | ICD-10-CM | POA: Insufficient documentation

## 2022-01-08 DIAGNOSIS — R079 Chest pain, unspecified: Secondary | ICD-10-CM

## 2022-01-08 DIAGNOSIS — Y92410 Unspecified street and highway as the place of occurrence of the external cause: Secondary | ICD-10-CM | POA: Diagnosis not present

## 2022-01-08 DIAGNOSIS — M25561 Pain in right knee: Secondary | ICD-10-CM | POA: Diagnosis present

## 2022-01-08 MED ORDER — IBUPROFEN 800 MG PO TABS
800.0000 mg | ORAL_TABLET | Freq: Once | ORAL | Status: AC
  Administered 2022-01-08: 800 mg via ORAL
  Filled 2022-01-08: qty 1

## 2022-01-08 MED ORDER — OXYCODONE HCL 5 MG PO TABS
5.0000 mg | ORAL_TABLET | Freq: Once | ORAL | Status: AC
  Administered 2022-01-08: 5 mg via ORAL
  Filled 2022-01-08: qty 1

## 2022-01-08 MED ORDER — ACETAMINOPHEN 500 MG PO TABS
1000.0000 mg | ORAL_TABLET | Freq: Once | ORAL | Status: AC
  Administered 2022-01-08: 1000 mg via ORAL
  Filled 2022-01-08: qty 2
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
## 2022-01-08 NOTE — Progress Notes (Signed)
Orthopedic Tech Progress Note Patient Details:  Tiffany Christensen Nov 22, 1981 [id number]  Ortho Devices Type of Ortho Device: Knee Immobilizer, Crutches Ortho Device/Splint Location: right Ortho Device/Splint Interventions: Application   Post Interventions Patient Tolerated: Well Instructions Provided: Care of device  [name] 01/08/2022, 3:01 PM

## 2022-01-08 NOTE — Discharge Instructions (Signed)
Your x-ray of your chest also does not show any obvious rib fracture or collapsed lung.  As we discussed with where the x-ray of your knee did not show an obvious acute injury.  I am going to place you in something called a knee immobilizer which will help keep your leg straight.  Try and keep your weight off of this with crutches.  You will hurt worse tomorrow this is normal.  Please return for confusion difficulty breathing severe abdominal discomfort.  Follow-up with your family doctor in the office.  You are still having symptoms in about a week they may choose to reimage you.  Take 4 over the counter ibuprofen tablets 3 times a day or 2 over-the-counter naproxen tablets twice a day for pain. Also take tylenol 1000mg (2 extra strength) four times a day.
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
## 2022-02-11 IMAGING — DX DG CHEST 2V
2 series · 2 of 2 positions shown · non-contrast
Comparison: 07/14/2020

CLINICAL DATA: RIGHT-SIDED CHEST PAIN.  HISTORY OF PNEUMOTHORAX.

EXAM:
CHEST - 2 VIEW

[chest pa]
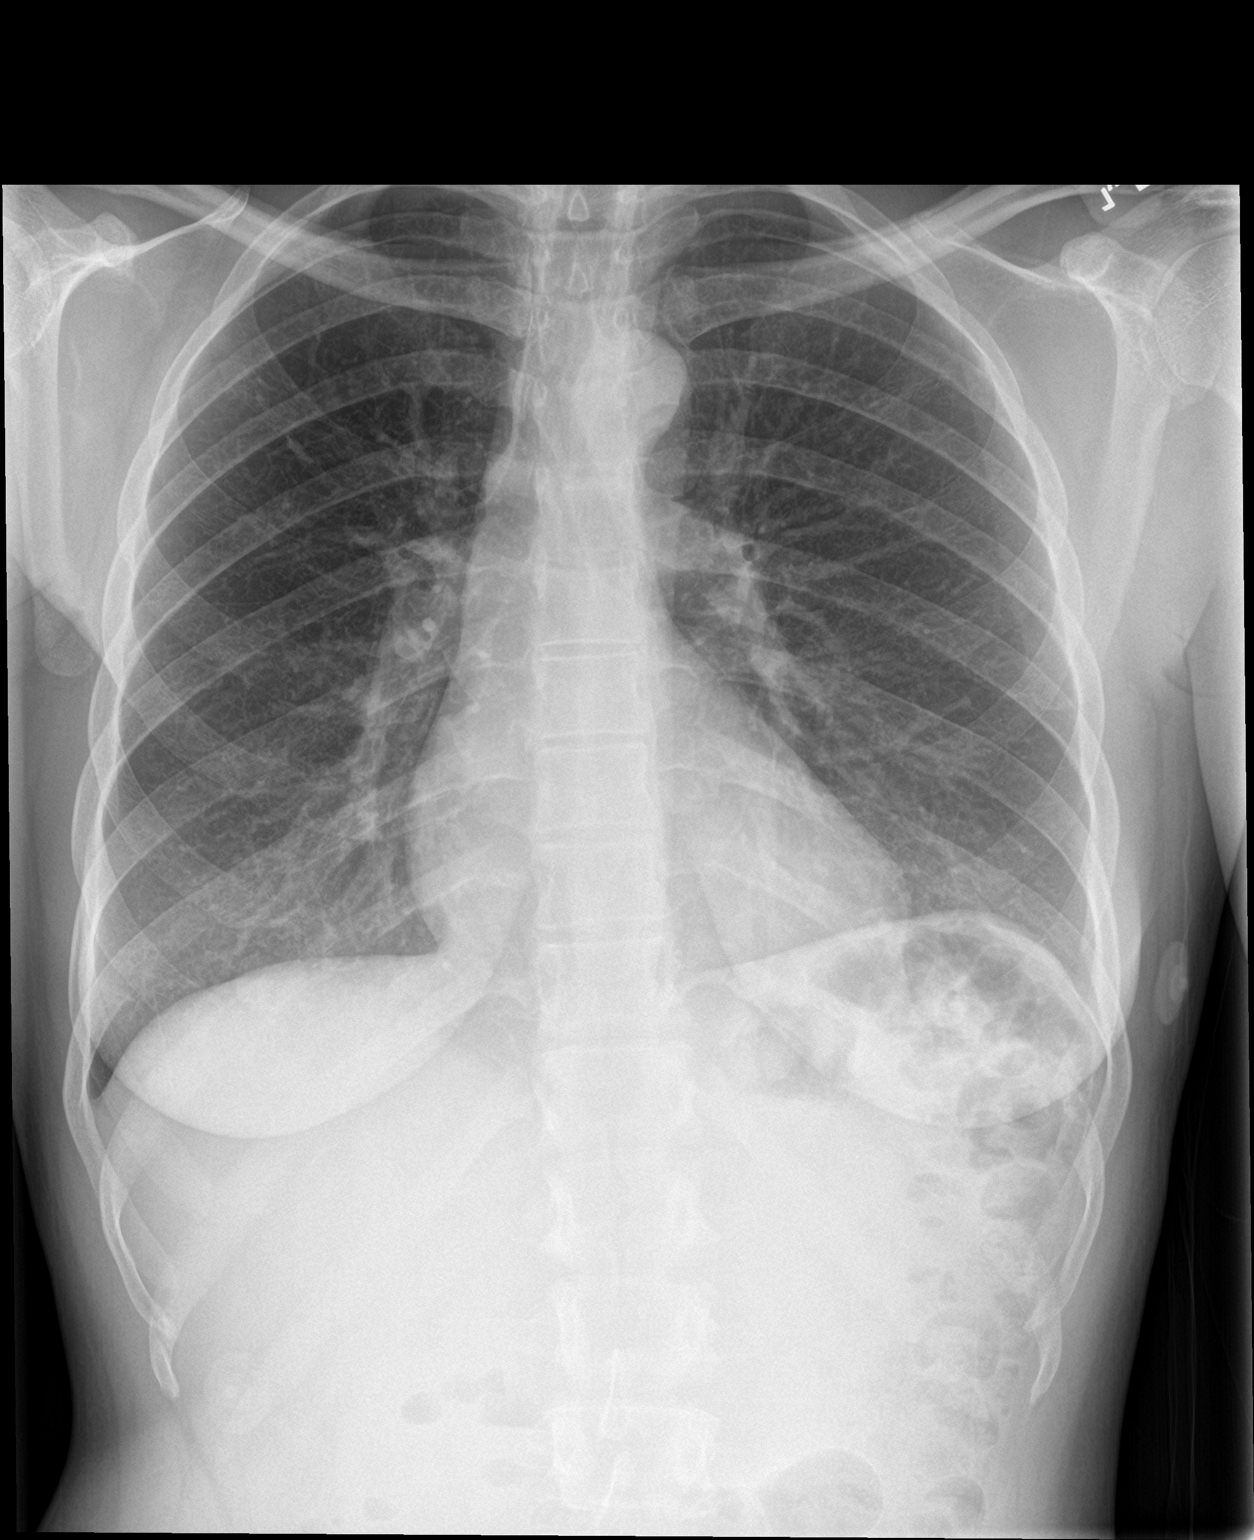

[chest lat]
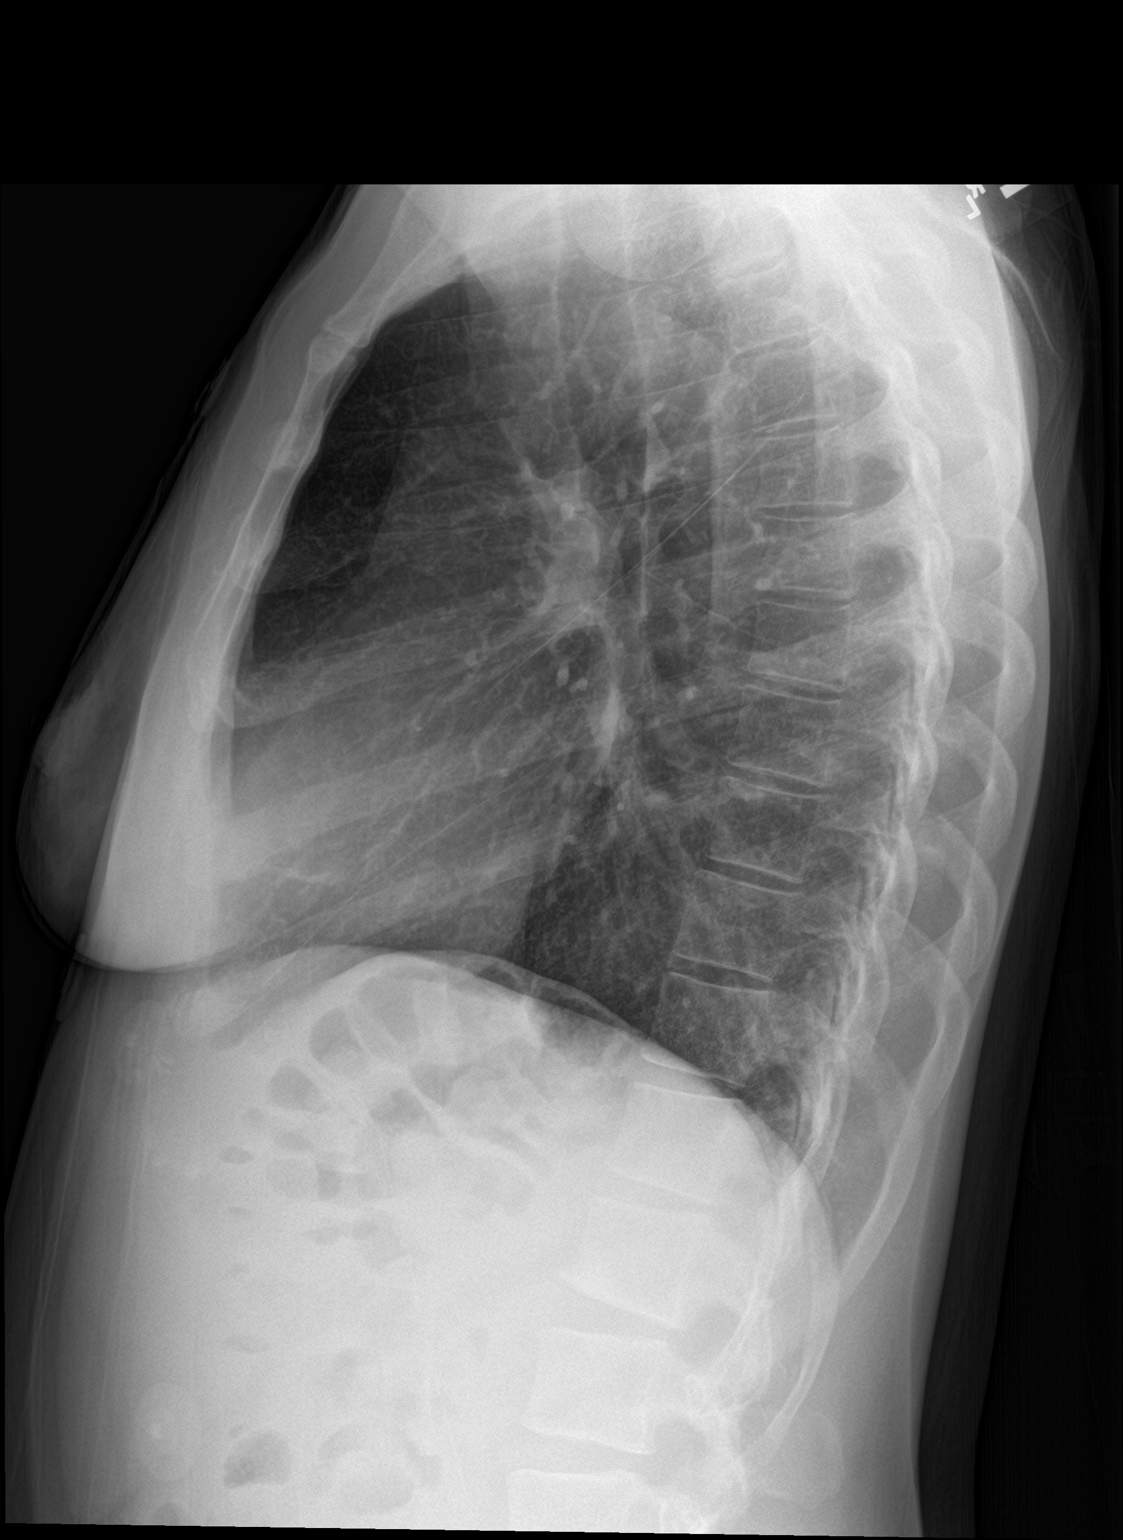

[2 of 2 positions shown; findings below may reference images not displayed]

FINDINGS: The lungs are clear without focal pneumonia, edema, pneumothorax or
pleural effusion. The cardiopericardial silhouette is within normal
limits for size. The visualized bony structures of the thorax show
no acute abnormality.
IMPRESSION: No active cardiopulmonary disease.

## 2022-04-20 ENCOUNTER — Encounter (HOSPITAL_BASED_OUTPATIENT_CLINIC_OR_DEPARTMENT_OTHER): Payer: Self-pay

## 2022-04-20 ENCOUNTER — Emergency Department (HOSPITAL_BASED_OUTPATIENT_CLINIC_OR_DEPARTMENT_OTHER)
Admission: EM | Admit: 2022-04-20 | Discharge: 2022-04-20 | Disposition: A | Discharge status: Home / Self Care | Payer: Medicaid Other | Attending: Emergency Medicine | Admitting: Emergency Medicine

## 2022-04-20 ENCOUNTER — Other Ambulatory Visit: Payer: Self-pay

## 2022-04-20 DIAGNOSIS — G43409 Hemiplegic migraine, not intractable, without status migrainosus: Secondary | ICD-10-CM | POA: Insufficient documentation

## 2022-04-20 DIAGNOSIS — R519 Headache, unspecified: Secondary | ICD-10-CM | POA: Diagnosis present

## 2022-04-20 MED ORDER — KETOROLAC TROMETHAMINE 15 MG/ML IJ SOLN
15.0000 mg | Freq: Once | INTRAMUSCULAR | Status: AC
  Administered 2022-04-20: 15 mg via INTRAVENOUS
  Filled 2022-04-20: qty 1

## 2022-04-20 MED ORDER — DIPHENHYDRAMINE HCL 50 MG/ML IJ SOLN
25.0000 mg | Freq: Once | INTRAMUSCULAR | Status: AC
  Administered 2022-04-20: 25 mg via INTRAVENOUS
  Filled 2022-04-20: qty 1

## 2022-04-20 MED ORDER — PROCHLORPERAZINE EDISYLATE 10 MG/2ML IJ SOLN
10.0000 mg | Freq: Once | INTRAMUSCULAR | Status: AC
  Administered 2022-04-20: 10 mg via INTRAVENOUS
  Filled 2022-04-20: qty 2

## 2022-04-20 MED ORDER — DEXAMETHASONE SODIUM PHOSPHATE 4 MG/ML IJ SOLN
4.0000 mg | Freq: Once | INTRAMUSCULAR | Status: AC
  Administered 2022-04-20: 4 mg via INTRAVENOUS
  Filled 2022-04-20: qty 1

## 2022-04-20 MED ORDER — SODIUM CHLORIDE 0.9 % IV BOLUS
1000.0000 mL | Freq: Once | INTRAVENOUS | Status: AC
  Administered 2022-04-20: 1000 mL via INTRAVENOUS

## 2022-04-20 NOTE — ED Provider Notes (Signed)
?[facility] EMERGENCY DEPARTMENT ?Provider Note ? ? ?CSN: [id number] ?Arrival date & time: 04/20/22  1804 ? ?  ? ?History ? ?Chief Complaint  ?Patient presents with  ? Migraine  ? ? ?Tiffany Christensen is a 41 y.o. female.  Patient presents to the hospital complaining of 2 days of left-sided headache with photophobia, nausea, vomiting.  Patient has history of migraines and states this feels the same as her previous migraines.  Patient was previously treated with an IV "headache cocktail" which seemed to resolve her symptoms.  Denies hitting her head, loss of consciousness.  Denies this being the worst headache of her life or being sudden onset. ? ?HPI ? ?  ? ?Home Medications ?Prior to Admission medications   ?Medication Sig Start Date End Date Taking? Authorizing Provider  ?amitriptyline (ELAVIL) 50 MG tablet Take 50 mg by mouth at bedtime.    [provider]  ?diclofenac (VOLTAREN) 75 MG EC tablet Take 1 tablet (75 mg total) by mouth 2 (two) times daily. 05/03/21   Fransico Meadow, PA-C  ?HYDROcodone-acetaminophen (NORCO/VICODIN) 5-325 MG tablet Take 1 tablet by mouth every 6 (six) hours as needed for severe pain. 11/23/20   Ripley Fraise, MD  ?lidocaine (LIDODERM) 5 % Place 1 patch onto the skin daily. Remove & Discard patch within 12 hours or as directed by MD 01/15/21   Tegeler, Gwenyth Allegra, MD  ?methocarbamol (ROBAXIN) 500 MG tablet Take 1 tablet (500 mg total) by mouth every 8 (eight) hours as needed for up to 15 doses for muscle spasms. 12/21/21   Curatolo, Adam, DO  ?naproxen (NAPROSYN) 500 MG tablet Take 1 tablet (500 mg total) by mouth 2 (two) times daily. 06/09/21   Veryl Speak, MD  ?ondansetron (ZOFRAN-ODT) 4 MG disintegrating tablet Take 1 tablet (4 mg total) by mouth every 8 (eight) hours as needed for nausea or vomiting. 01/03/22   Gareth Morgan, MD  ?pantoprazole (PROTONIX) 20 MG tablet Take 1 tablet (20 mg total) by mouth daily. 06/26/20   Lorin Glass, PA-C  ?pantoprazole  (PROTONIX) 20 MG tablet Take 1 tablet (20 mg total) by mouth daily for 7 days. 01/03/22 01/10/22  Gareth Morgan, MD  ?predniSONE (DELTASONE) 5 MG tablet Take 6 pills for first day, 5 pills second day, 4 pills third day, 3 pills fourth day, 2 pills the fifth day, and 1 pill sixth day. 06/20/21   Rosemarie Ax, MD  ?SUMAtriptan (IMITREX) 50 MG tablet Take 50 mg by mouth every 2 (two) hours as needed for migraine or headache.  ?Patient not taking: Reported on 06/21/2020 05/06/20 10/18/20  [provider]  ?zonisamide (ZONEGRAN) 100 MG capsule  02/16/20 10/18/20  [provider]  ?   ? ?Allergies    ?Patient has no known allergies.   ? ?Review of Systems   ?Review of Systems  ?Constitutional:  Negative for fever.  ?Eyes:  Positive for photophobia.  ?Respiratory:  Negative for shortness of breath.   ?Cardiovascular:  Negative for chest pain.  ?Gastrointestinal:  Positive for nausea. Negative for vomiting.  ?Neurological:  Positive for headaches.  ? ?Physical Exam ?Updated Vital Signs ?BP (!) 146/91   Pulse 76   Temp 98.7 ?F (37.1 ?C) (Oral)   Resp 20   Ht '5\' 5"'$  (1.651 m)   Wt 73.9 kg   LMP 11/08/2016 (Exact Date) Comment: neg upreg  SpO2 97%   BMI 27.12 kg/m?  ?Physical Exam ?Vitals and nursing note reviewed.  ?Constitutional:   ?  General: She is not in acute distress. ?HENT:  ?   Head: Normocephalic and atraumatic.  ?Eyes:  ?   Conjunctiva/sclera: Conjunctivae normal.  ?   Pupils: Pupils are equal, round, and reactive to light.  ?Cardiovascular:  ?   Rate and Rhythm: Normal rate.  ?Pulmonary:  ?   Effort: Pulmonary effort is normal.  ?Musculoskeletal:  ?   Cervical back: Normal range of motion.  ?Skin: ?   General: Skin is warm and dry.  ?Neurological:  ?   General: No focal deficit present.  ?   Mental Status: She is alert and oriented to person, place, and time.  ? ? ?ED Results / Procedures / Treatments   ?Labs ?(all labs ordered are listed, but only abnormal results are displayed) ?Labs  Reviewed - No data to display ? ?EKG ?None ? ?Radiology ?No results found. ? ?Procedures ?Procedures  ? ? ?Medications Ordered in ED ?Medications  ?ketorolac (TORADOL) 15 MG/ML injection 15 mg (15 mg Intravenous Given 04/20/22 1952)  ?dexamethasone (DECADRON) injection 4 mg (4 mg Intravenous Given 04/20/22 1952)  ?diphenhydrAMINE (BENADRYL) injection 25 mg (25 mg Intravenous Given 04/20/22 1952)  ?sodium chloride 0.9 % bolus 1,000 mL (1,000 mLs Intravenous Bolus from Bag 04/20/22 1950)  ?prochlorperazine (COMPAZINE) injection 10 mg (10 mg Intravenous Given 04/20/22 1952)  ? ? ?ED Course/ Medical Decision Making/ A&P ?  ?                        ?Medical Decision Making ?Risk ?Prescription drug management. ? ? ?Patient presented with a chief concern of headache.  Differential includes but is not limited to migraine, cluster headache, tension headache, intracranial bleed ? ?The patient's headache was not sudden onset or worst of her life.  Very low clinical suspicion of intracranial bleed.  The patient states that her headache is identical to previous migraines and the patient does have nausea.  I believe this is likely a repeat migraine ? ?I saw no indication at this time for imaging based on the patient's symptoms and no utility in lab work. ? ?I ordered Toradol, Decadron, Benadryl, Compazine, and normal saline for the patient's headache.  Upon reassessment the patient had drastically improved. ? ?At this time I believe the patient is safe to discharge home.  She is resting comfortably and states she feels much better. ? ?Final Clinical Impression(s) / ED Diagnoses ?Final diagnoses:  ?Hemiplegic migraine without status migrainosus, not intractable  ? ? ?Rx / DC Orders ?ED Discharge Orders   ? ? None  ? ?  ? ? ?  ?Dorothyann Peng, PA-C ?04/20/22 2056 ? ?  ?Margette Fast, MD ?04/25/22 1536 ? ?

## 2022-04-20 NOTE — ED Notes (Signed)
ED Provider at bedside. 

## 2022-04-20 NOTE — Discharge Instructions (Addendum)
You were seen and treated today for migraine.  Recommend follow-up with your primary care provider for chronic treatment as necessary ?

## 2022-04-20 NOTE — ED Triage Notes (Signed)
C/o migraine since yesterday. Hx of same. N/V, light sensitivity. ?

## 2022-04-20 NOTE — ED Notes (Signed)
Patient c/o history of migraines, states she has not had a migraine in a while, but this migraine has been going on since yesterday without relief. Reports sensitivity to light and n/v. Also states that she has not had anything to eat or drink since yesterday.  ?

## 2022-10-26 ENCOUNTER — Other Ambulatory Visit: Payer: Self-pay

## 2022-10-26 ENCOUNTER — Emergency Department (HOSPITAL_BASED_OUTPATIENT_CLINIC_OR_DEPARTMENT_OTHER)
Admission: EM | Admit: 2022-10-26 | Discharge: 2022-10-26 | Disposition: A | Discharge status: Home / Self Care | Payer: Medicaid Other | Attending: Emergency Medicine | Admitting: Emergency Medicine

## 2022-10-26 ENCOUNTER — Encounter (HOSPITAL_BASED_OUTPATIENT_CLINIC_OR_DEPARTMENT_OTHER): Payer: Self-pay | Admitting: Emergency Medicine

## 2022-10-26 ENCOUNTER — Emergency Department (HOSPITAL_BASED_OUTPATIENT_CLINIC_OR_DEPARTMENT_OTHER): Payer: Medicaid Other

## 2022-10-26 DIAGNOSIS — I10 Essential (primary) hypertension: Secondary | ICD-10-CM | POA: Insufficient documentation

## 2022-10-26 DIAGNOSIS — S5011XA Contusion of right forearm, initial encounter: Secondary | ICD-10-CM | POA: Insufficient documentation

## 2022-10-26 DIAGNOSIS — R0781 Pleurodynia: Secondary | ICD-10-CM | POA: Diagnosis not present

## 2022-10-26 DIAGNOSIS — Y92002 Bathroom of unspecified non-institutional (private) residence single-family (private) house as the place of occurrence of the external cause: Secondary | ICD-10-CM | POA: Insufficient documentation

## 2022-10-26 DIAGNOSIS — M25561 Pain in right knee: Secondary | ICD-10-CM | POA: Diagnosis present

## 2022-10-26 DIAGNOSIS — W19XXXA Unspecified fall, initial encounter: Secondary | ICD-10-CM

## 2022-10-26 DIAGNOSIS — Y9301 Activity, walking, marching and hiking: Secondary | ICD-10-CM | POA: Insufficient documentation

## 2022-10-26 DIAGNOSIS — W01190A Fall on same level from slipping, tripping and stumbling with subsequent striking against furniture, initial encounter: Secondary | ICD-10-CM | POA: Diagnosis not present

## 2022-10-26 MED ORDER — OXYCODONE-ACETAMINOPHEN 5-325 MG PO TABS
1.0000 | ORAL_TABLET | Freq: Once | ORAL | Status: AC
  Administered 2022-10-26: 1 via ORAL
  Filled 2022-10-26: qty 1

## 2022-10-26 MED ORDER — IBUPROFEN 800 MG PO TABS: 800.0000 mg | ORAL_TABLET | Freq: Three times a day (TID) | ORAL | 0 refills | Status: AC

## 2022-10-26 NOTE — ED Notes (Signed)
Reviewed discharge instruction and recommendations. Pt states understanding. Pt able to ambulate with crutches. Son transporting home
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
## 2022-10-26 NOTE — ED Notes (Signed)
Pt. Has bruising on her R inner arm and her R knee from a fall on Tues night.  Pt. Able to walk and use her arm.  Pt. Tiffany Christensen it hurts to walk.  Pt. In no distress.

## 2022-10-26 NOTE — Discharge Instructions (Addendum)
You have been seen today for your complaint of fall, right knee pain, right wrist pain. Your imaging was reassuring and showed no fractures. Your discharge medications include ibuprofen and Tylenol.  You may take up to 800 mg of ibuprofen up to 1000 mg of Tylenol.  You may alternate these every 4 hours. Home care instructions are as follows:  Perform gentle range of motion exercises daily Follow up with: Your primary orthopedic provider or the sports medicine doctor (Dr. Raeford Razor) that I have listed in this paperwork.  You should get into whoever can see you next the soonest. Please seek immediate medical care if you develop any of the following symptoms: You have a new injury and your pain is worse or different. You feel numb or you have tingling in the painful area. At this time there does not appear to be the presence of an emergent medical condition, however there is always the potential for conditions to change. Please read and follow the below instructions.  Do not take your medicine if  develop an itchy rash, swelling in your mouth or lips, or difficulty breathing; call 911 and seek immediate emergency medical attention if this occurs.  You may review your lab tests and imaging results in their entirety on your MyChart account.  Please discuss all results of fully with your primary care provider and other specialist at your follow-up visit.  Note: Portions of this text may have been transcribed using voice recognition software. Every effort was made to ensure accuracy; however, inadvertent computerized transcription errors may still be present.

## 2022-10-26 NOTE — ED Provider Notes (Signed)
[facility] EMERGENCY DEPARTMENT Provider Note   CSN: [id number] Arrival date & time: 10/26/22  1109     History  Chief Complaint  Patient presents with   Tiffany Christensen is a 41 y.o. female.  With a history of hypertension, Barrett's esophagus, bilateral knee meniscectomy who presents the ED for evaluation of a fall.  Patient states she was walking out of her bathroom on Tuesday night and states that her right knee buckled.  She fell to her right side and had her left knee, side, and forearm.  States she fell into her dresser and some items fell on her.  Did not hit her head.  Did not lose consciousness.  Not on blood thinners.  Reports it has been difficult to fully extend her right knee secondary to pain.  Was able to ambulate around the house with crutches, however she was borrowing it from her brother and her brother needed him back.  Has been taking Tylenol and oxycodone for pain with relief at home.  Denies numbness, weakness, tingling, pulselessness, cool extremities, chest pain, shortness of breath, difficulty taking deep breaths.  Last dose of pain medicines at 6 AM today.   Fall       Home Medications Prior to Admission medications   Medication Sig Start Date End Date Taking? Authorizing Provider  amitriptyline (ELAVIL) 50 MG tablet Take 50 mg by mouth at bedtime.    [provider]  diclofenac (VOLTAREN) 75 MG EC tablet Take 1 tablet (75 mg total) by mouth 2 (two) times daily. 05/03/21   Fransico Meadow, PA-C  HYDROcodone-acetaminophen (NORCO/VICODIN) 5-325 MG tablet Take 1 tablet by mouth every 6 (six) hours as needed for severe pain. 11/23/20   Ripley Fraise, MD  lidocaine (LIDODERM) 5 % Place 1 patch onto the skin daily. Remove & Discard patch within 12 hours or as directed by MD 01/15/21   Tegeler, Gwenyth Allegra, MD  methocarbamol (ROBAXIN) 500 MG tablet Take 1 tablet (500 mg total) by mouth every 8 (eight) hours as needed for up to 15 doses  for muscle spasms. 12/21/21   Curatolo, Adam, DO  naproxen (NAPROSYN) 500 MG tablet Take 1 tablet (500 mg total) by mouth 2 (two) times daily. 06/09/21   Veryl Speak, MD  ondansetron (ZOFRAN-ODT) 4 MG disintegrating tablet Take 1 tablet (4 mg total) by mouth every 8 (eight) hours as needed for nausea or vomiting. 01/03/22   Gareth Morgan, MD  pantoprazole (PROTONIX) 20 MG tablet Take 1 tablet (20 mg total) by mouth daily. 06/26/20   Lorin Glass, PA-C  pantoprazole (PROTONIX) 20 MG tablet Take 1 tablet (20 mg total) by mouth daily for 7 days. 01/03/22 01/10/22  Gareth Morgan, MD  predniSONE (DELTASONE) 5 MG tablet Take 6 pills for first day, 5 pills second day, 4 pills third day, 3 pills fourth day, 2 pills the fifth day, and 1 pill sixth day. 06/20/21   Rosemarie Ax, MD  SUMAtriptan (IMITREX) 50 MG tablet Take 50 mg by mouth every 2 (two) hours as needed for migraine or headache.  Patient not taking: Reported on 06/21/2020 05/06/20 10/18/20  [provider]  zonisamide (ZONEGRAN) 100 MG capsule  02/16/20 10/18/20  [provider]      Allergies    Patient has no known allergies.    Review of Systems   Review of Systems  Physical Exam Updated Vital Signs BP 133/76   Pulse 82   Temp (!)  97.5 F (36.4 C) (Oral)   Resp 17   Ht '5\' 5"'$  (1.651 m)   Wt 75.8 kg   LMP 11/08/2016 (Exact Date) Comment: neg upreg  SpO2 100%   BMI 27.79 kg/m  Physical Exam Vitals and nursing note reviewed.  Constitutional:      General: She is not in acute distress.    Appearance: Normal appearance. She is normal weight. She is not ill-appearing or diaphoretic.  HENT:     Head: Normocephalic and atraumatic.  Eyes:     Pupils: Pupils are equal, round, and reactive to light.  Cardiovascular:     Rate and Rhythm: Normal rate.     Pulses:          Radial pulses are 2+ on the right side and 2+ on the left side.       Dorsalis pedis pulses are 2+ on the right side and 2+ on the left  side.  Pulmonary:     Effort: Pulmonary effort is normal. No respiratory distress.     Breath sounds: No stridor. No wheezing, rhonchi or rales.  Abdominal:     General: Abdomen is flat.  Musculoskeletal:        General: Normal range of motion.     Cervical back: Neck supple.     Comments: Moderate amount of swelling to the right knee.  Patient is holding the knee in slight flexion.  Significant tenderness to palpation of the right knee and right ribs  Skin:    General: Skin is warm and dry.     Capillary Refill: Capillary refill takes less than 2 seconds.     Comments: Small bruise to right forearm  Neurological:     General: No focal deficit present.     Mental Status: She is alert and oriented to person, place, and time.  Psychiatric:        Mood and Affect: Mood normal.        Behavior: Behavior normal.     ED Results / Procedures / Treatments   Labs (all labs ordered are listed, but only abnormal results are displayed) Labs Reviewed - No data to display  EKG None  Radiology DG Forearm Right  Result Date: 10/26/2022 CLINICAL DATA:  Trauma, recent fall EXAM: RIGHT FOREARM - 2 VIEW COMPARISON:  None Available. FINDINGS: There is no evidence of fracture or other focal bone lesions. Soft tissues are unremarkable. IMPRESSION: No fracture or dislocation is seen in right forearm. Electronically Signed   By: Elmer Picker M.D.   On: 10/26/2022 12:02   DG Knee Complete 4 Views Right  Result Date: 10/26/2022 CLINICAL DATA:  Trauma, recent fall EXAM: RIGHT KNEE - COMPLETE 4+ VIEW COMPARISON:  08/15/2022 FINDINGS: No fracture or dislocation is seen. There is soft tissue fullness in suprapatellar bursa suggesting small effusion. Minimal bony spurs seen in patella and lateral compartments. IMPRESSION: No recent fracture or dislocation is seen. Small effusion is present in suprapatellar bursa. Electronically Signed   By: Elmer Picker M.D.   On: 10/26/2022 12:01     Procedures Procedures    Medications Ordered in ED Medications - No data to display  ED Course/ Medical Decision Making/ A&P Clinical Course as of 10/26/22 1614  Fri Oct 26, 2022  1545 Pain is significantly improved after Percocet [AS]    Clinical Course User Index [AS] Roylene Reason, PA-C  Medical Decision Making Amount and/or Complexity of Data Reviewed Radiology: ordered.  Risk Prescription drug management.  This patient presents to the ED for concern of right knee pain, right ribs pain, this involves an extensive number of treatment options, and is a complaint that carries with it a high risk of complications and morbidity.  The differential diagnosis includes fracture, dislocations, contusions, muscle strains or tendon or ligament sprains   Co morbidities that complicate the patient evaluation   bilateral meniscectomy, hypertension  My initial workup includes right knee, forearm, ribs x-rays, pain control  Additional history obtained from: Nursing notes from this visit.  I ordered imaging studies including very knee, forearm, ribs I independently visualized and interpreted imaging which showed small right superior patellar effusion.  No other abnormalities I agree with the radiologist interpretation  Afebrile, hemodynamically stable.  Patient is a 41 year old female presenting to the ED for evaluation of a fall.  Presents with right knee, forearm and lower ribs.  Patient was ambulatory prior to arrival.  Main complaint is not being able to fully extend her right knee.  Physical exam significant for moderate amount of swelling to the right knee as well as tenderness.  Neurovascular status intact.  Also has tenderness to palpation of the right ribs.  X-ray of these are negative.  Also complaining of a bruise to her right forearm.  X-ray of this was also negative.  Neurovascular status of this is intact as well.  Patient was given pain  medication, and Ace wrap for the right knee, and ice pack and crutches.  Was observed ambulating in the ED prior to discharge.  Did ambulate without difficulty.  Gave patient follow-up information for sports medicine, but also encouraged her to follow-up with her primary orthopedic provider.  Gave patient return precautions.  Stable at discharge.  At this time there does not appear to be any evidence of an acute emergency medical condition and the patient appears stable for discharge with appropriate outpatient follow up. Diagnosis was discussed with patient who verbalizes understanding of care plan and is agreeable to discharge. I have discussed return precautions with patient who verbalizes understanding. Patient encouraged to follow-up with their PCP within 1 week. All questions answered.  Patient's case discussed with Dr. Philip Aspen who agrees with plan to discharge with follow-up.   Note: Portions of this report may have been transcribed using voice recognition software. Every effort was made to ensure accuracy; however, inadvertent computerized transcription errors may still be present.          Final Clinical Impression(s) / ED Diagnoses Final diagnoses:  None    Rx / DC Orders ED Discharge Orders     None         Roylene Reason, Hershal Coria 10/26/22 1702    Fransico Meadow, MD 10/29/22 1147

## 2023-03-27 ENCOUNTER — Encounter: Payer: Self-pay | Admitting: *Deleted

## 2023-07-16 ENCOUNTER — Other Ambulatory Visit: Payer: Self-pay

## 2023-07-16 ENCOUNTER — Encounter (HOSPITAL_BASED_OUTPATIENT_CLINIC_OR_DEPARTMENT_OTHER): Payer: Self-pay | Admitting: Emergency Medicine

## 2023-07-16 ENCOUNTER — Emergency Department (HOSPITAL_BASED_OUTPATIENT_CLINIC_OR_DEPARTMENT_OTHER)
Admission: EM | Admit: 2023-07-16 | Discharge: 2023-07-16 | Disposition: A | Discharge status: Home / Self Care | Payer: Medicaid Other | Attending: Emergency Medicine | Admitting: Emergency Medicine

## 2023-07-16 DIAGNOSIS — J069 Acute upper respiratory infection, unspecified: Secondary | ICD-10-CM | POA: Diagnosis not present

## 2023-07-16 DIAGNOSIS — Z87891 Personal history of nicotine dependence: Secondary | ICD-10-CM | POA: Insufficient documentation

## 2023-07-16 DIAGNOSIS — Z20822 Contact with and (suspected) exposure to covid-19: Secondary | ICD-10-CM | POA: Insufficient documentation

## 2023-07-16 DIAGNOSIS — R112 Nausea with vomiting, unspecified: Secondary | ICD-10-CM

## 2023-07-16 DIAGNOSIS — R059 Cough, unspecified: Secondary | ICD-10-CM | POA: Diagnosis present

## 2023-07-16 DIAGNOSIS — I10 Essential (primary) hypertension: Secondary | ICD-10-CM | POA: Diagnosis not present

## 2023-07-16 LAB — RESP PANEL BY RT-PCR (RSV, FLU A&B, COVID)  RVPGX2
Influenza A by PCR: NEGATIVE
Influenza B by PCR: NEGATIVE
Resp Syncytial Virus by PCR: NEGATIVE
SARS Coronavirus 2 by RT PCR: NEGATIVE

## 2023-07-16 MED ORDER — ACETAMINOPHEN 325 MG PO TABS: 650.0000 mg | ORAL_TABLET | Freq: Four times a day (QID) | ORAL | 0 refills | Status: DC | PRN

## 2023-07-16 MED ORDER — ONDANSETRON HCL 4 MG PO TABS: 4.0000 mg | ORAL_TABLET | ORAL | 0 refills | Status: DC | PRN

## 2023-07-16 MED ORDER — GUAIFENESIN-DM 100-10 MG/5ML PO SYRP
5.0000 mL | ORAL_SOLUTION | ORAL | Status: DC | PRN
  Administered 2023-07-16: 5 mL via ORAL
  Filled 2023-07-16: qty 5

## 2023-07-16 MED ORDER — ONDANSETRON 4 MG PO TBDP
4.0000 mg | ORAL_TABLET | Freq: Once | ORAL | Status: AC
  Administered 2023-07-16: 4 mg via ORAL
  Filled 2023-07-16: qty 1

## 2023-07-16 MED ORDER — ACETAMINOPHEN 325 MG PO TABS: 650.0000 mg | ORAL_TABLET | Freq: Four times a day (QID) | ORAL | 0 refills | Status: AC | PRN

## 2023-07-16 MED ORDER — GUAIFENESIN-CODEINE 100-10 MG/5ML PO SOLN: 5.0000 mL | Freq: Three times a day (TID) | ORAL | 0 refills | Status: DC | PRN

## 2023-07-16 NOTE — ED Provider Notes (Signed)
[facility] DEPARTMENT AT [facility] Provider Note  CSN: [id number] Arrival date & time: 07/16/23 2140  Chief Complaint(s) URI  HPI Tiffany Christensen is a 42 y.o. female with past medical history as below, significant for barrett's esophagus, htn, migraine who presents to the ED with complaint of cough, nausea, abd cramping, diarrhea, poor appetite, body aches. Onset around 3-4 days ago, seen by pcp yesterday. Negative covid test per pt. She is tolerating PO. No fever.   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 Authorizing Provider  acetaminophen (TYLENOL) 325 MG tablet Take 2 tablets (650 mg total) by mouth every 6 (six) hours as needed. 07/16/23  Yes Tanda Rockers A, DO  guaiFENesin-codeine 100-10 MG/5ML syrup Take 5 mLs by mouth 3 (three) times daily as needed for cough. 07/16/23  Yes Tanda Rockers A, DO  ondansetron (ZOFRAN) 4 MG tablet Take 1 tablet (4 mg total) by mouth every 4 (four) hours as needed for nausea or vomiting. 07/16/23  Yes Tanda Rockers A, DO  amitriptyline (ELAVIL) 50 MG tablet Take 50 mg by mouth at bedtime.    [provider]  diclofenac (VOLTAREN) 75 MG EC tablet Take 1 tablet (75 mg total) by mouth 2 (two) times daily. 05/03/21   Elson Areas, PA-C  pantoprazole (PROTONIX) 20 MG tablet Take 1 tablet (20 mg total) by mouth  daily. 06/26/20   Cristina Gong, PA-C  pantoprazole (PROTONIX) 20 MG tablet Take 1 tablet (20 mg total) by mouth daily for 7 days. 01/03/22 01/10/22  Alvira Monday, MD  SUMAtriptan (IMITREX) 50 MG tablet Take 50 mg by mouth every 2 (two) hours as needed for migraine or headache.  Patient not taking: Reported on 06/21/2020 05/06/20 10/18/20  [provider]  zonisamide (ZONEGRAN) 100 MG capsule  02/16/20 10/18/20  [provider]                                                                                                                                    Past Surgical History Past Surgical History:  Procedure Laterality Date   ABDOMINAL HYSTERECTOMY     BREAST LUMPECTOMY     BREAST SURGERY     CARPAL TUNNEL RELEASE  ESOPHAGOGASTRODUODENOSCOPY N/A 12/07/2016   Procedure: ESOPHAGOGASTRODUODENOSCOPY (EGD);  Surgeon: Sherrilyn Rist, MD;  Location: [facility] ENDOSCOPY;  Service: Endoscopy;  Laterality: N/A;   HAND SURGERY Right    hemmorhoidectomy     HEMORRHOID SURGERY     HERNIA REPAIR     Family History Family History  Problem Relation Age of Onset   Hypertension Maternal Grandmother    Cancer Sister     Social History Social History   Tobacco Use   Smoking status: Former    Current packs/day: 0.50    Types: Cigarettes   Smokeless tobacco: Never   Tobacco comments:    Quit x 2 weeks ago  Vaping Use   Vaping status: Never Used  Substance Use Topics   Alcohol use: No   Drug use: No   Allergies Patient has no known allergies.  Review of Systems Review of Systems  Physical Exam Vital Signs  I have reviewed the triage vital signs BP (!) 145/91 (BP Location: Left Arm)   Pulse 82   Temp 99 F (37.2 C)   Resp 18   Ht 5\' 5"  (1.651 m)   Wt 67.1 kg   LMP 11/08/2016 (Exact Date) Comment: neg upreg  SpO2 100%   BMI 24.63 kg/m  Physical Exam  ED Results and Treatments Labs (all labs ordered are listed, but only abnormal results are  displayed) Labs Reviewed  RESP PANEL BY RT-PCR (RSV, FLU A&B, COVID)  RVPGX2                                                                                                                          Radiology No results found.  Pertinent labs & imaging results that were available during my care of the patient were reviewed by me and considered in my medical decision making (see MDM for details).  Medications Ordered in ED Medications  ondansetron (ZOFRAN-ODT) disintegrating tablet 4 mg (has no administration in time range)  guaiFENesin-dextromethorphan (ROBITUSSIN DM) 100-10 MG/5ML syrup 5 mL (has no administration in time range)                                                                                                                                     Procedures Procedures  (including critical care time)  Medical Decision Making / ED Course    Medical Decision Making:    Tiffany Christensen is a  42 y.o. female ***. The complaint involves an extensive differential diagnosis and also carries with it a high risk of complications and morbidity.  Serious etiology was considered. Ddx includes but is not limited to: ***  Complete initial physical exam performed, notably the patient  was ***.    Reviewed and confirmed nursing documentation for past medical history, family history, social history.  Vital signs reviewed.       Narrative:                 Additional history obtained: -Additional history obtained from {wsadditionalhistorian:28072} -External records from outside source obtained and reviewed including: Chart review including previous notes, labs, imaging, consultation notes including ***   Lab Tests: -I ordered, reviewed, and interpreted labs.   The pertinent results include:   Labs Reviewed  RESP PANEL BY RT-PCR (RSV, FLU A&B, COVID)  RVPGX2    Notable for ***  EKG   EKG Interpretation Date/Time:    Ventricular Rate:    PR Interval:    QRS  Duration:    QT Interval:    QTC Calculation:   R Axis:      Text Interpretation:           Imaging Studies ordered: I ordered imaging studies including *** I independently visualized the following imaging with scope of interpretation limited to determining acute life threatening conditions related to emergency care; findings noted above, significant for *** I independently visualized and interpreted imaging. I agree with the radiologist interpretation   Medicines ordered and prescription drug management: Meds ordered this encounter  Medications   ondansetron (ZOFRAN-ODT) disintegrating tablet 4 mg   guaiFENesin-dextromethorphan (ROBITUSSIN DM) 100-10 MG/5ML syrup 5 mL   guaiFENesin-codeine 100-10 MG/5ML syrup    Sig: Take 5 mLs by mouth 3 (three) times daily as needed for cough.    Dispense:  118 mL    Refill:  0   ondansetron (ZOFRAN) 4 MG tablet    Sig: Take 1 tablet (4 mg total) by mouth every 4 (four) hours as needed for nausea or vomiting.    Dispense:  5 tablet    Refill:  0   acetaminophen (TYLENOL) 325 MG tablet    Sig: Take 2 tablets (650 mg total) by mouth every 6 (six) hours as needed.    Dispense:  36 tablet    Refill:  0    -I have reviewed the patients home medicines and have made adjustments as needed   Consultations Obtained: I requested consultation with the ***,  and discussed lab and imaging findings as well as pertinent plan - they recommend: ***   Cardiac Monitoring: The patient was maintained on a cardiac monitor.  I personally viewed and interpreted the cardiac monitored which showed an underlying rhythm of: ***  Social Determinants of Health:  Diagnosis or treatment significantly limited by social determinants of health: {wssoc:28071}   Reevaluation: After the interventions noted above, I reevaluated the patient and found that they have {resolved/improved/worsened:23923::"improved"}  Co morbidities that complicate the patient  evaluation  Past Medical History:  Diagnosis Date   Barrett's esophagus    Gastric ulcer    Hypertension    Migraine    Ovarian cyst    Pneumothorax on right    Uterine fibroid       Dispostion: Disposition decision including need for hospitalization was considered, and patient {wsdispo:28070::"discharged from emergency department."}    Final Clinical Impression(s) / ED Diagnoses Final diagnoses:  Viral URI with cough  Nausea vomiting  and diarrhea

## 2023-07-16 NOTE — Discharge Instructions (Addendum)
It was a pleasure caring for you today in the emergency department.  Please return to the emergency department for any worsening or worrisome symptoms.  Please follow up on mychart in regards to your COVID/FLU/RSV swab  Please follow up with PCP in next few days for recheck

## 2023-07-16 NOTE — ED Triage Notes (Signed)
Pt c/o headache, feeling feverish, generalized body aches since Thursday.  Reports n/v/d as well. Poor PO intake today. No medications PTA.

## 2023-09-08 ENCOUNTER — Encounter (HOSPITAL_BASED_OUTPATIENT_CLINIC_OR_DEPARTMENT_OTHER): Payer: Self-pay | Admitting: Urology

## 2023-09-08 ENCOUNTER — Emergency Department (HOSPITAL_BASED_OUTPATIENT_CLINIC_OR_DEPARTMENT_OTHER)
Admission: EM | Admit: 2023-09-08 | Discharge: 2023-09-08 | Disposition: A | Discharge status: Home / Self Care | Payer: Medicaid Other | Attending: Emergency Medicine | Admitting: Emergency Medicine

## 2023-09-08 ENCOUNTER — Other Ambulatory Visit: Payer: Self-pay

## 2023-09-08 DIAGNOSIS — B349 Viral infection, unspecified: Secondary | ICD-10-CM | POA: Diagnosis not present

## 2023-09-08 DIAGNOSIS — Z1152 Encounter for screening for COVID-19: Secondary | ICD-10-CM | POA: Insufficient documentation

## 2023-09-08 DIAGNOSIS — R059 Cough, unspecified: Secondary | ICD-10-CM | POA: Diagnosis present

## 2023-09-08 LAB — RESP PANEL BY RT-PCR (RSV, FLU A&B, COVID)  RVPGX2
Influenza A by PCR: NEGATIVE
Influenza B by PCR: NEGATIVE
Resp Syncytial Virus by PCR: NEGATIVE
SARS Coronavirus 2 by RT PCR: NEGATIVE

## 2023-09-08 MED ORDER — GUAIFENESIN-CODEINE 100-10 MG/5ML PO SOLN: 5.0000 mL | Freq: Three times a day (TID) | ORAL | 0 refills | Status: AC | PRN

## 2023-09-08 MED ORDER — IBUPROFEN 400 MG PO TABS
600.0000 mg | ORAL_TABLET | Freq: Once | ORAL | Status: AC
  Administered 2023-09-08: 600 mg via ORAL
  Filled 2023-09-08: qty 1

## 2023-09-08 MED ORDER — ONDANSETRON 4 MG PO TBDP: 4.0000 mg | ORAL_TABLET | Freq: Three times a day (TID) | ORAL | 0 refills | Status: AC | PRN

## 2023-09-08 NOTE — ED Provider Notes (Signed)
[place] [facility] Provider Note   CSN: [id number] Arrival date & time: 09/08/23  1740     History  Chief Complaint  Patient presents with   Flu like symptoms    LADASHIA DEMARINIS is a 42 y.o. female.  Patient with no significant past medical history presents to the emergency department for evaluation of chills, runny nose, sneezing, cough, body aches over the past 2 days.  No known sick contacts.  She reports subjective fever at home.  Has been taking over-the-counter medications.  No nausea, vomiting, or diarrhea.  No urinary symptoms.       Home Medications Prior to Admission medications   Medication Sig Start Date End Date Taking? Authorizing Provider  acetaminophen (TYLENOL) 325 MG tablet Take 2 tablets (650 mg total) by mouth every 6 (six) hours as needed. 07/16/23   Sloan Leiter, DO  amitriptyline (ELAVIL) 50 MG tablet Take 50 mg by mouth at bedtime.    [provider]  diclofenac (VOLTAREN) 75 MG EC tablet Take 1 tablet (75 mg total) by mouth 2 (two) times daily. 05/03/21   Elson Areas, PA-C  guaiFENesin-codeine 100-10 MG/5ML syrup Take 5 mLs by mouth 3 (three) times daily as needed for cough. 07/16/23   Sloan Leiter, DO  ondansetron (ZOFRAN) 4 MG tablet Take 1 tablet (4 mg total) by mouth every 4 (four) hours as needed for nausea or vomiting. 07/16/23   Sloan Leiter, DO  pantoprazole (PROTONIX) 20 MG tablet Take 1 tablet (20 mg total) by mouth daily. 06/26/20   Cristina Gong, PA-C  pantoprazole (PROTONIX) 20 MG tablet Take 1 tablet (20 mg total) by mouth daily for 7 days. 01/03/22 01/10/22  Alvira Monday, MD  SUMAtriptan (IMITREX) 50 MG tablet Take 50 mg by mouth every 2 (two) hours as needed for migraine or headache.  Patient not taking: Reported on 06/21/2020 05/06/20 10/18/20  [provider]  zonisamide (ZONEGRAN) 100 MG capsule  02/16/20 10/18/20  [provider]      Allergies    Patient has no  known allergies.    Review of Systems   Review of Systems  Physical Exam Updated Vital Signs BP 117/86   Pulse 80   Temp 98.2 F (36.8 C) (Oral)   Resp 20   Ht 5\' 5"  (1.651 m)   Wt 67.1 kg   LMP 11/08/2016 (Exact Date) Comment: neg upreg  SpO2 99%   BMI 24.62 kg/m  Physical Exam Vitals and nursing note reviewed.  Constitutional:      Appearance: She is well-developed.  HENT:     Head: Normocephalic and atraumatic.     Jaw: No trismus.     Right Ear: Tympanic membrane, ear canal and external ear normal.     Left Ear: Tympanic membrane, ear canal and external ear normal.     Nose: Congestion and rhinorrhea present. No mucosal edema.     Mouth/Throat:     Mouth: Mucous membranes are moist. Mucous membranes are not dry. No oral lesions.     Pharynx: Uvula midline. No oropharyngeal exudate, posterior oropharyngeal erythema or uvula swelling.     Tonsils: No tonsillar abscesses.  Eyes:     General:        Right eye: No discharge.        Left eye: No discharge.     Conjunctiva/sclera: Conjunctivae normal.  Cardiovascular:     Rate and Rhythm: Normal rate and regular rhythm.  Heart sounds: Normal heart sounds.  Pulmonary:     Effort: Pulmonary effort is normal. No respiratory distress.     Breath sounds: Normal breath sounds. No wheezing or rales.  Abdominal:     Palpations: Abdomen is soft.     Tenderness: There is no abdominal tenderness.  Musculoskeletal:     Cervical back: Normal range of motion and neck supple.  Lymphadenopathy:     Cervical: No cervical adenopathy.  Skin:    General: Skin is warm and dry.  Neurological:     Mental Status: She is alert.  Psychiatric:        Mood and Affect: Mood normal.     ED Results / Procedures / Treatments   Labs (all labs ordered are listed, but only abnormal results are displayed) Labs Reviewed  RESP PANEL BY RT-PCR (RSV, FLU A&B, COVID)  RVPGX2    EKG None  Radiology No results  found.  Procedures Procedures    Medications Ordered in ED Medications - No data to display  ED Course/ Medical Decision Making/ A&P    Patient seen and examined. History obtained directly from patient.   Labs/EKG: Ordered flu/COVID  Imaging: None ordered  Medications/Fluids: Ordered: P.o. ibuprofen for headache.   Most recent vital signs reviewed and are as follows: BP 117/86   Pulse 80   Temp 98.2 F (36.8 C) (Oral)   Resp 20   Ht 5\' 5"  (1.651 m)   Wt 67.1 kg   LMP 11/08/2016 (Exact Date) Comment: neg upreg  SpO2 99%   BMI 24.62 kg/m   Initial impression: viral syndrome  6:50 PM Reassessment performed. Patient appears stable.  Labs personally reviewed and interpreted including: Negative   Reviewed pertinent lab work and imaging with patient at bedside. Questions answered.   Most current vital signs reviewed and are as follows: BP 117/86   Pulse 80   Temp 98.2 F (36.8 C) (Oral)   Resp 20   Ht 5\' 5"  (1.651 m)   Wt 67.1 kg   LMP 11/08/2016 (Exact Date) Comment: neg upreg  SpO2 99%   BMI 24.62 kg/m   Plan: Discharge to home.   Prescriptions written for: Codeine cough syrup and Zofran  Patient counseled on use of narcotic pain medications. Counseled not to combine these medications with others containing tylenol. Urged not to drink alcohol, drive, or perform any other activities that requires focus while taking these medications. The patient verbalizes understanding and agrees with the plan.  Other home care instructions discussed: OTC treatments  ED return instructions discussed: Worsening shortness of breath, trouble breathing, persistent vomiting  Follow-up instructions discussed: Patient encouraged to follow-up with their PCP in 5 days.                                   Medical Decision Making  Patient with symptoms consistent with a viral syndrome. Vitals are stable, no fever. No signs of dehydration. Lung exam normal, no signs of pneumonia.  Supportive therapy indicated with return if symptoms worsen.           Final Clinical Impression(s) / ED Diagnoses Final diagnoses:  Viral syndrome    Rx / DC Orders ED Discharge Orders     None         Renne Crigler, PA-C 09/08/23 1851    Linwood Dibbles, MD 09/10/23 1236

## 2023-09-08 NOTE — Discharge Instructions (Signed)
Please read and follow all provided instructions.  Your diagnoses today include:  1. Viral syndrome     You appear to have an upper respiratory infection (URI). An upper respiratory tract infection, or cold, is a viral infection of the air passages leading to the lungs. It should improve gradually after 5-7 days. You may have a lingering cough that lasts for 2- 4 weeks after the infection.  Tests performed today include: Vital signs. See below for your results today.  COVID, flu, RSV testing was negative  Medications prescribed:  Cough syrup  Zofran (ondansetron) - for nausea and vomiting  Take any prescribed medications only as directed. Treatment for your infection is aimed at treating the symptoms. There are no medications, such as antibiotics, that will cure your infection.   Home care instructions:  You can take Tylenol and/or Ibuprofen as directed on the packaging for fever reduction and pain relief.    For cough: honey 1/2 to 1 teaspoon (you can dilute the honey in water or another fluid).  You can also use guaifenesin and dextromethorphan for cough. You can use a humidifier for chest congestion and cough.  If you don't have a humidifier, you can sit in the bathroom with the hot shower running.      For sore throat: try warm salt water gargles, cepacol lozenges, throat spray, warm tea or water with lemon/honey, popsicles or ice, or OTC cold relief medicine for throat discomfort.    For congestion: take a daily anti-histamine like Zyrtec, Claritin, and a oral decongestant, such as pseudoephedrine.  You can also use Flonase 1-2 sprays in each nostril daily.    It is important to stay hydrated: drink plenty of fluids (water, gatorade/powerade/pedialyte, juices, or teas) to keep your throat moisturized and help further relieve irritation/discomfort.   Your illness is contagious and can be spread to others, especially during the first 3 or 4 days. It cannot be cured by antibiotics or  other medicines. Take basic precautions such as washing your hands often, covering your mouth when you cough or sneeze, and avoiding public places where you could spread your illness to others.   Please continue drinking plenty of fluids.  Use over-the-counter medicines as needed as directed on packaging for symptom relief.  You may also use ibuprofen or tylenol as directed on packaging for pain or fever.  Do not take multiple medicines containing Tylenol or acetaminophen to avoid taking too much of this medication.  Follow-up instructions: Please follow-up with your primary care provider in the next 3 days for further evaluation of your symptoms if you are not feeling better.   Return instructions:  Please return to the Emergency Department if you experience worsening symptoms.  RETURN IMMEDIATELY IF you develop shortness of breath, confusion or altered mental status, a new rash, become dizzy, faint, or poorly responsive, or are unable to be cared for at home. Please return if you have persistent vomiting and cannot keep down fluids or develop a fever that is not controlled by tylenol or motrin.   Please return if you have any other emergent concerns.  Additional Information:  Your vital signs today were: BP 117/86   Pulse 80   Temp 98.2 F (36.8 C) (Oral)   Resp 20   Ht 5\' 5"  (1.651 m)   Wt 67.1 kg   LMP 11/08/2016 (Exact Date) Comment: neg upreg  SpO2 99%   BMI 24.62 kg/m  If your blood pressure (BP) was elevated above 135/85 this visit,  please have this repeated by your doctor within one month. --------------

## 2023-09-08 NOTE — ED Triage Notes (Signed)
Pt states fever, runny nose, congestion, cough, body aches since Friday  Denies N/V

## 2023-12-28 ENCOUNTER — Emergency Department (HOSPITAL_BASED_OUTPATIENT_CLINIC_OR_DEPARTMENT_OTHER): Payer: Medicaid Other

## 2023-12-28 ENCOUNTER — Emergency Department (HOSPITAL_BASED_OUTPATIENT_CLINIC_OR_DEPARTMENT_OTHER)
Admission: EM | Admit: 2023-12-28 | Discharge: 2023-12-28 | Disposition: A | Discharge status: Home / Self Care | Payer: Medicaid Other | Attending: Emergency Medicine | Admitting: Emergency Medicine

## 2023-12-28 ENCOUNTER — Encounter (HOSPITAL_BASED_OUTPATIENT_CLINIC_OR_DEPARTMENT_OTHER): Payer: Self-pay

## 2023-12-28 DIAGNOSIS — Z20822 Contact with and (suspected) exposure to covid-19: Secondary | ICD-10-CM | POA: Diagnosis not present

## 2023-12-28 DIAGNOSIS — B9789 Other viral agents as the cause of diseases classified elsewhere: Secondary | ICD-10-CM | POA: Diagnosis not present

## 2023-12-28 DIAGNOSIS — J988 Other specified respiratory disorders: Secondary | ICD-10-CM | POA: Insufficient documentation

## 2023-12-28 DIAGNOSIS — R059 Cough, unspecified: Secondary | ICD-10-CM | POA: Diagnosis present

## 2023-12-28 LAB — RESP PANEL BY RT-PCR (RSV, FLU A&B, COVID)  RVPGX2
Influenza A by PCR: NEGATIVE
Influenza B by PCR: NEGATIVE
Resp Syncytial Virus by PCR: NEGATIVE
SARS Coronavirus 2 by RT PCR: NEGATIVE

## 2023-12-28 LAB — GROUP A STREP BY PCR: Group A Strep by PCR: NOT DETECTED

## 2023-12-28 MED ORDER — LIDOCAINE VISCOUS HCL 2 % MT SOLN: 15.0000 mL | Freq: Four times a day (QID) | OROMUCOSAL | 0 refills | Status: AC | PRN

## 2023-12-28 MED ORDER — LIDOCAINE VISCOUS HCL 2 % MT SOLN
15.0000 mL | Freq: Once | OROMUCOSAL | Status: AC
  Administered 2023-12-28: 15 mL via OROMUCOSAL
  Filled 2023-12-28: qty 15

## 2023-12-28 MED ORDER — ACETAMINOPHEN 500 MG PO TABS
1000.0000 mg | ORAL_TABLET | Freq: Once | ORAL | Status: AC
  Administered 2023-12-28: 1000 mg via ORAL
  Filled 2023-12-28: qty 2

## 2023-12-28 NOTE — ED Provider Notes (Signed)
[place] [facility]GENCY [facility] Provider Note   CSN: [id number] Arrival date & time: 12/28/23  6301     History  Chief Complaint  Patient presents with   Sore Throat   Influenza    Tiffany Christensen is a 43 y.o. female.  HPI 43 year old female presents with a chief complaint of sore throat.  She states she has been having some sneezing that progressed into cough starting last week.  She fell about 4 days ago and went to [facility] where she was evaluated.  Since then she has been developing sore throat and has pain with swallowing.  She is having a cough with some sputum though no fevers.  She is having diffuse bodyaches but thinks that is more from the fall.  Her chest feels tight though she thinks that is from the coughing and sore throat.  Home Medications Prior to Admission medications   Medication Sig Start Date End Date Taking? Authorizing Provider  lidocaine (XYLOCAINE) 2 % solution Use as directed 15 mLs in the mouth or throat every 6 (six) hours as needed for mouth pain. 12/28/23  Yes Pricilla Loveless, MD  acetaminophen (TYLENOL) 325 MG tablet Take 2 tablets (650 mg total) by mouth every 6 (six) hours as needed. 07/16/23   Sloan Leiter, DO  amitriptyline (ELAVIL) 50 MG tablet Take 50 mg by mouth at bedtime.    [provider]  diclofenac (VOLTAREN) 75 MG EC tablet Take 1 tablet (75 mg total) by mouth 2 (two) times daily. 05/03/21   Elson Areas, PA-C  guaiFENesin-codeine 100-10 MG/5ML syrup Take 5 mLs by mouth 3 (three) times daily as needed for cough. 09/08/23   Renne Crigler, PA-C  ondansetron (ZOFRAN-ODT) 4 MG disintegrating tablet Take 1 tablet (4 mg total) by mouth every 8 (eight) hours as needed for nausea or vomiting. 09/08/23   Renne Crigler, PA-C  pantoprazole (PROTONIX) 20 MG tablet Take 1 tablet (20 mg total) by mouth daily. 06/26/20   Cristina Gong, PA-C  pantoprazole (PROTONIX) 20 MG tablet Take 1 tablet (20 mg total)  by mouth daily for 7 days. 01/03/22 01/10/22  Alvira Monday, MD  SUMAtriptan (IMITREX) 50 MG tablet Take 50 mg by mouth every 2 (two) hours as needed for migraine or headache.  Patient not taking: Reported on 06/21/2020 05/06/20 10/18/20  [provider]  zonisamide (ZONEGRAN) 100 MG capsule  02/16/20 10/18/20  [provider]      Allergies    Patient has no known allergies.    Review of Systems   Review of Systems  Constitutional:  Negative for fever.  HENT:  Positive for congestion, sneezing and sore throat.   Respiratory:  Positive for cough and chest tightness.   Gastrointestinal:  Negative for vomiting.    Physical Exam Updated Vital Signs BP 120/89   Pulse 68   Temp 97.6 F (36.4 C)   Resp 18   LMP 11/08/2016 (Exact Date) Comment: neg upreg  SpO2 96%  Physical Exam Vitals and nursing note reviewed.  Constitutional:      General: She is not in acute distress.    Appearance: She is well-developed. She is not ill-appearing or diaphoretic.  HENT:     Head: Normocephalic and atraumatic.     Mouth/Throat:     Mouth: Mucous membranes are moist.     Pharynx: Oropharynx is clear. Uvula midline. No pharyngeal swelling, oropharyngeal exudate, posterior oropharyngeal erythema or uvula swelling.  Tonsils: No tonsillar exudate or tonsillar abscesses.  Neck:     Comments: No neck swelling or decreased ROM Cardiovascular:     Rate and Rhythm: Normal rate and regular rhythm.     Heart sounds: Normal heart sounds.  Pulmonary:     Effort: Pulmonary effort is normal.     Breath sounds: Normal breath sounds.  Abdominal:     Palpations: Abdomen is soft.     Tenderness: There is no abdominal tenderness.  Skin:    General: Skin is warm and dry.  Neurological:     Mental Status: She is alert.     ED Results / Procedures / Treatments   Labs (all labs ordered are listed, but only abnormal results are displayed) Labs Reviewed  GROUP A STREP BY PCR  RESP PANEL BY  RT-PCR (RSV, FLU A&B, COVID)  RVPGX2    EKG None  Radiology DG Chest 2 View Result Date: 12/28/2023 CLINICAL DATA:  Cough EXAM: CHEST - 2 VIEW COMPARISON:  None Available. FINDINGS: The heart size and mediastinal contours are within normal limits. Both lungs are clear. The visualized skeletal structures are unremarkable. IMPRESSION: No active cardiopulmonary disease. Electronically Signed   By: Helyn Numbers M.D.   On: 12/28/2023 04:51    Procedures Procedures    Medications Ordered in ED Medications  acetaminophen (TYLENOL) tablet 1,000 mg (has no administration in time range)  lidocaine (XYLOCAINE) 2 % viscous mouth solution 15 mL (has no administration in time range)    ED Course/ Medical Decision Making/ A&P                                 Medical Decision Making Amount and/or Complexity of Data Reviewed External Data Reviewed: radiology and notes.    Details: Negative CT and x-ray imaging for trauma Labs:     Details: COVID/flu/RSV/strep testing all negative Radiology: ordered and independent interpretation performed.    Details: No pneumonia  Risk OTC drugs. Prescription drug management.   Patient presents with diffuse body aches and sore throat.  Strep testing is negative.  Her oropharynx appears clear without obvious swelling.  No stridor or change in voice or drooling.  Very low suspicion for deep space neck infection.  She is otherwise appearing well with normal vitals.  Has clear lungs.  I suspect that she has a viral illness.  No evidence of strep, COVID, flu.  I do not think antibiotics are warranted.  She does have chest pain though she feels like it is more related to the cough.  I think this is likely correct though I did offer an EKG which she has declined.  Will treat supportively and have her follow-up with PCP.  Stable for discharge with return precautions.        Final Clinical Impression(s) / ED Diagnoses Final diagnoses:  Viral respiratory  infection    Rx / DC Orders ED Discharge Orders          Ordered    lidocaine (XYLOCAINE) 2 % solution  Every 6 hours PRN        12/28/23 0829              Pricilla Loveless, MD 12/28/23 [phone]

## 2023-12-28 NOTE — Discharge Instructions (Signed)
You may take ibuprofen and Tylenol to help with pain.  We are giving you a medicine to help with your sore throat.  Follow-up with your primary care physician.  If you develop new or worsening symptoms, throat closing sensation, inability to swallow, neck swelling, change in voice, or any other new/concerning symptoms then return to the ER or call 911.

## 2023-12-28 NOTE — ED Triage Notes (Signed)
Pt reports that she has had flu symptoms for the past 2 weeks, cough, sneezing nausea diarrhea, sore throat for the past week and coughing up green phlegm. Pt reports that she fell a week ago and was seen at [facility] regional and continues to have pain.

## 2024-07-10 NOTE — Telephone Encounter (Signed)
 Pt called requesting a return call to discuss the severe rt knee pain radiating down her rt leg she is experiencing  - Pt states she is having difficulty walking & states she missed her scheduled appt on 07/08/24 as result - Pt can be reached @ [phone]

## 2024-07-10 NOTE — Telephone Encounter (Addendum)
 Patient calling about her short term disability with [employer].   She states she has emailed out since 06/16/2024 and left phone messages but has not heard anything back.   She states she needs her disability extended until she can have surgery.   Let patient know an RX was sent to her pharmacy.    Patient also needs to r/s her POST OP appt. Nothing available until 08/05/2024 that I can schedule. Please assist patient with r/s her POST OP appt. She states she missed her other appt due to severe pain and not being able to get out of bed.    Please call her ASAP - [phone]

## 2024-07-13 NOTE — Telephone Encounter (Signed)
 I have reached out to [place] regarding her 3 month ext with [employer]. I had that ext filed 06/18/2024, which was the last post-op visit date with Annah Hummer, PA-C. I will update this encounter once Niels has given me a solution.    DO NOT CLOSE THIS ENCOUNTER!

## 2024-07-13 NOTE — Telephone Encounter (Signed)
 Spoke with pt and she was advised she was given the soonest appt for Dr. Case due to his vacation schedule. Pt understood.   I did clarify that her FMLA documents for [employer] have been taken care of with Niels and pt states yes that is all good.   Pt understood all and no further needs at this time.

## 2024-07-14 NOTE — Progress Notes (Signed)
 Physical Therapy Missed Visit Note  Payor: [place] MEDICAID BLUE CROSS [place] / Plan: [place] MEDICAID HEALTHY BLUE / Product Type: Managed Medicaid /     The patient did not attend therapy appointment.  Reason:  No Show  Total Missed Visits:  1  Patient Contact:  Contacted pt via phone. Left VM reminding pt of appt. Provided time/date of next appointment and callback number for any questions.   Future Appointments:  Patient has additional appointments.  Action:  No action required.

## 2024-07-16 ENCOUNTER — Encounter (HOSPITAL_BASED_OUTPATIENT_CLINIC_OR_DEPARTMENT_OTHER): Payer: Self-pay

## 2024-07-16 ENCOUNTER — Other Ambulatory Visit: Payer: Self-pay

## 2024-07-16 ENCOUNTER — Emergency Department (HOSPITAL_BASED_OUTPATIENT_CLINIC_OR_DEPARTMENT_OTHER)

## 2024-07-16 ENCOUNTER — Emergency Department (HOSPITAL_BASED_OUTPATIENT_CLINIC_OR_DEPARTMENT_OTHER): Admission: EM | Admit: 2024-07-16 | Discharge: 2024-07-16 | Disposition: A | Discharge status: Home / Self Care

## 2024-07-16 DIAGNOSIS — M25511 Pain in right shoulder: Secondary | ICD-10-CM | POA: Insufficient documentation

## 2024-07-16 DIAGNOSIS — M25561 Pain in right knee: Secondary | ICD-10-CM | POA: Diagnosis not present

## 2024-07-16 DIAGNOSIS — T7840XA Allergy, unspecified, initial encounter: Secondary | ICD-10-CM | POA: Diagnosis present

## 2024-07-16 MED ORDER — FAMOTIDINE IN NACL 20-0.9 MG/50ML-% IV SOLN
20.0000 mg | Freq: Once | INTRAVENOUS | Status: AC
  Administered 2024-07-16: 20 mg via INTRAVENOUS
  Filled 2024-07-16: qty 50

## 2024-07-16 MED ORDER — METHYLPREDNISOLONE SODIUM SUCC 125 MG IJ SOLR
125.0000 mg | Freq: Once | INTRAMUSCULAR | Status: AC
  Administered 2024-07-16: 125 mg via INTRAVENOUS
  Filled 2024-07-16: qty 2

## 2024-07-16 MED ORDER — HYDROXYZINE HCL 25 MG PO TABS
25.0000 mg | ORAL_TABLET | Freq: Four times a day (QID) | ORAL | 0 refills | Status: AC | PRN
Start: 2024-07-16 — End: ?

## 2024-07-16 MED ORDER — KETOROLAC TROMETHAMINE 15 MG/ML IJ SOLN
15.0000 mg | Freq: Once | INTRAMUSCULAR | Status: AC
  Administered 2024-07-16: 15 mg via INTRAVENOUS
  Filled 2024-07-16: qty 1

## 2024-07-16 MED ORDER — HYDROXYZINE HCL 25 MG PO TABS
25.0000 mg | ORAL_TABLET | Freq: Once | ORAL | Status: AC
  Administered 2024-07-16: 25 mg via ORAL
  Filled 2024-07-16: qty 1

## 2024-07-16 NOTE — Progress Notes (Signed)
 Physical Therapy Missed Visit Note  Payor: [place] MEDICAID BLUE CROSS [place] / Plan: [place] MEDICAID HEALTHY BLUE / Product Type: Managed Medicaid /     The patient did not attend therapy appointment.  Reason:  No Show  Total Missed Visits:  2  Patient Contact:  Pt contacted via phone. Reminded pt of appointment time. Reinstated no show policy. Provided call back number.  Future Appointments:  Patient has additional appointments.  Action:  Reinforced education on cancellation/no show policy. If pt to no-show next appointment, pt will be discharged from Physical Therapy per clinic policy .

## 2024-07-16 NOTE — ED Provider Notes (Signed)
 [place] EM[facility]GENCY DE[facility]MENT [facility] Provider Note   CSN: [id number] Arrival date & time: 07/16/24  1736     Patient presents with: Allergic Reaction   Tiffany Christensen is a 43 y.o. female who presents with concern for an allergic reaction.  States she ate a pasta with mushrooms at about 2am today. 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  States she has been itching since eating this pasta.  Denies any feelings of shortness of breath, throat closure, rash, dizziness.  She reports she took Benadryl  at about 8 AM this morning, but has still felt itching.  Earlier today, she also tripped into her dresser and hit her right shoulder.  Denies hitting her head or any loss of consciousness.  She is reporting right shoulder pain as well as some right knee and shin pain.  Denies any numbness or tingling in the upper or lower extremities bilaterally.    Allergic Reaction Presenting symptoms: no rash        Prior to Admission medications   Medication Sig Start Date End Date Taking? Authorizing Provider  hydrOXYzine  (ATARAX ) 25 MG tablet Take 1 tablet (25 mg total) by mouth every 6 (six) hours as needed for itching. 07/16/24  Yes Nickoles Gregori, PA-C  acetaminophen  (TYLENOL ) 325 MG tablet Take 2 tablets (650 mg total) by mouth every 6 (six) hours as needed. 07/16/23   Elnor Jayson LABOR, DO  amitriptyline (ELAVIL) 50 MG tablet Take 50 mg by mouth at bedtime.    [provider]  diclofenac  (VOLTAREN ) 75 MG EC tablet Take 1 tablet (75 mg total) by mouth 2 (two) times daily. 05/03/21   Sofia, Leslie K, PA-C  guaiFENesin -codeine  100-10 MG/5ML syrup Take 5 mLs by mouth 3 (three) times daily as needed for cough. 09/08/23   Desiderio Chew, PA-C  lidocaine  (XYLOCAINE ) 2 % solution Use as directed 15 mLs in the mouth or throat every 6 (six) hours as needed for mouth pain. 12/28/23   Freddi Hamilton, MD  ondansetron  (ZOFRAN -ODT) 4 MG disintegrating tablet Take 1 tablet (4 mg total) by  mouth every 8 (eight) hours as needed for nausea or vomiting. 09/08/23   Desiderio Chew, PA-C  pantoprazole  (PROTONIX ) 20 MG tablet Take 1 tablet (20 mg total) by mouth daily. 06/26/20   Windle Almarie ORN, PA-C  pantoprazole  (PROTONIX ) 20 MG tablet Take 1 tablet (20 mg total) by mouth daily for 7 days. 01/03/22 01/10/22  Dreama Longs, MD  SUMAtriptan (IMITREX) 50 MG tablet Take 50 mg by mouth every 2 (two) hours as needed for migraine or headache.  Patient not taking: Reported on 06/21/2020 05/06/20 10/18/20  [provider]  zonisamide (ZONEGRAN) 100 MG capsule  02/16/20 10/18/20  [provider]    Allergies: Patient has no known allergies.    Review of Systems  Respiratory:  Negative for shortness of breath.   Skin:  Negative for rash.    Updated Vital Signs BP 109/73   Pulse 100   Temp 98.4 F (36.9 C) (Oral)   Resp 18   LMP 11/08/2016 (Exact Date) Comment: neg upreg  SpO2 96%   Physical Exam Vitals and nursing note reviewed.  Constitutional:      General: She is not in acute distress.    Appearance: She is well-developed.  HENT:     Head: Normocephalic and atraumatic.     Mouth/Throat:     Comments: No edema of the lips, tongue, posterior oropharynx.  Swallowing secretions without difficulty. Eyes:  Conjunctiva/sclera: Conjunctivae normal.  Cardiovascular:     Rate and Rhythm: Normal rate and regular rhythm.     Heart sounds: No murmur heard.    Comments: Radial pulse 2+ bilaterally, dorsalis pedis pulse 2+ bilaterally Pulmonary:     Effort: Pulmonary effort is normal. No respiratory distress.     Breath sounds: Normal breath sounds.     Comments: Talking in full sentences on room air without difficulty.  Lungs clear to auscultation bilaterally.  No stridor Abdominal:     Palpations: Abdomen is soft.     Tenderness: There is no abdominal tenderness.  Musculoskeletal:        General: No swelling.     Cervical back: Neck supple.     Comments: No  tenderness of the cervical, thoracic, or lumbar spine   Right upper extremity:  General No obvious deformity. No erythema, edema, contusions, open wounds   Palpation Palpation along the AC joint and humeral head. Non-tender to palpation along the clavicle or distal humerus. Non-tender to palpation over the scapular ridge.  Nontender of the radius or ulna.  ROM Limited shoulder abduction due to pain.  Full internal rotation, external rotation.   Special tests No drop arm sign  Sensation: Sensation intact throughout the bilateral upper extremities  Right lower extremity:  General Contusions over the right shin.  No obvious deformity. No erythema, edema,  open wounds   Palpation Tender to palpation over the middle aspect of the tibia.  Reports diffuse pain over the knee, no point tenderness to palpation.  Nontender over the femur Nontender on the lateral and medial malleolus Non-tender of the popliteal fossa  ROM Full knee flexion and extension Full ankle plantarflexion dorsiflexion  Special tests No ligamentous laxity with Lachman's or posterior drawer testing   Sensation: Sensation intact throughout the lower extremity  Strength: 5/5 strength with resisted ankle plantarflexion and dorsiflexion     Skin:    General: Skin is warm and dry.     Capillary Refill: Capillary refill takes less than 2 seconds.     Findings: No rash.  Neurological:     Mental Status: She is alert.  Psychiatric:        Mood and Affect: Mood normal.     (all labs ordered are listed, but only abnormal results are displayed) Labs Reviewed - No data to display  EKG: None  Radiology: DG Knee Complete 4 Views Right Result Date: 07/16/2024 CLINICAL DATA:  Knee pain EXAM: RIGHT KNEE - COMPLETE 4+ VIEW COMPARISON:  10/26/2022 FINDINGS: Fracture or malalignment.  Small knee effusion.  Patent joint spaces IMPRESSION: Small knee effusion. Electronically Signed   By: Luke Bun M.D.   On:  07/16/2024 19:07   DG Tibia/Fibula Right Result Date: 07/16/2024 CLINICAL DATA:  Recent surgery right knee, pain EXAM: RIGHT TIBIA AND FIBULA - 2 VIEW COMPARISON:  10/26/2022 FINDINGS: There is no evidence of fracture or other focal bone lesions. Soft tissues are unremarkable. IMPRESSION: Negative. Electronically Signed   By: Luke Bun M.D.   On: 07/16/2024 19:06   DG Shoulder Right Result Date: 07/16/2024 CLINICAL DATA:  Shoulder pain after fall EXAM: RIGHT SHOULDER - 2+ VIEW COMPARISON:  None Available. FINDINGS: There is no evidence of fracture or dislocation. There is no evidence of arthropathy or other focal bone abnormality. Soft tissues are unremarkable. IMPRESSION: Negative. Electronically Signed   By: Luke Bun M.D.   On: 07/16/2024 19:05     Procedures   Medications Ordered in the  ED  methylPREDNISolone  sodium succinate (SOLU-MEDROL ) 125 mg/2 mL injection 125 mg (125 mg Intravenous Given 07/16/24 1828)  famotidine  (PEPCID ) IVPB 20 mg premix (0 mg Intravenous Stopped 07/16/24 1856)  hydrOXYzine  (ATARAX ) tablet 25 mg (25 mg Oral Given 07/16/24 1826)  ketorolac  (TORADOL ) 15 MG/ML injection 15 mg (15 mg Intravenous Given 07/16/24 1829)    Clinical Course as of 07/16/24 1954  Thu Jul 16, 2024  1820 No signs of anaphylaxis upon initial evaluation [AF]  1946 Upon reevaluation, patient reports itching symptoms have resolved.  No further signs of allergic reaction. [AF]    Clinical Course User Index [AF] Veta Palma, PA-C                                 Medical Decision Making Amount and/or Complexity of Data Reviewed Radiology: ordered.  Risk Prescription drug management.     Differential diagnosis includes but is not limited to allergic reaction, anaphylaxis, fracture, dislocation, bone contusion, sprain  ED Course:  Upon initial evaluation, patient is well-appearing, stable vital signs.  No acute distress.  She ate mushrooms at 2 AM today and has been having itching  since then.  No other signs of allergic reaction such as rash, shortness of breath, feelings of throat closure, any lip or tongue swelling, no dizziness or vomiting, no hypertension, no concern for anaphylaxis at this time.  She is speaking in full sentences without difficulty.  No stridor.  She also reports that earlier today, she tripped and fell into her dresser.  She did hit her right shoulder and has tenderness to the range of motion of the right shoulder due to pain.  She is neurovascular intact in the the right shin.  She is tender diffusely over the right knee, nontender over the middle aspect of the tibia.  Of the right shin.  No obvious deformity.  She is neurovascular intact in the bilateral lower extremities. Has full range of motion of the right knee and right ankle.   Imaging Studies ordered: I ordered imaging studies including x-ray right knee, x-ray right tib-fib, x-ray right shoulder I independently visualized the imaging with scope of interpretation limited to determining acute life threatening conditions related to emergency care. Imaging showed no acute abnormalities I agree with the radiologist interpretation   Medications Given: Famotidine  Hydroxyzine  Solu-Medrol  Toradol   Upon re-evaluation, patient reports itching has completely resolved.  Remains without any further signs of allergic reaction.  The x-ray of her right shoulder, right knee, and right tib-fib did not show any acute fractures or dislocations.  Suspect bone contusion given the mechanism of injury.  Patient stable appropriate for discharge home at this time    Impression: Allergic reaction Right knee contusion Right shoulder contusion  Disposition:  The patient was discharged home with instructions to take hydroxyzine  as needed for itching at home.  May take Tylenol  and ibuprofen  as needed for pain.  Can ice her shoulder and heat to help with pain and swelling.  Follow-up with orthopedics if pain not  improving within the next 1 to 2 weeks.  Contact information for orthopedics was provided. Return precautions given.    This chart was dictated using voice recognition software, Dragon. Despite the best efforts of this provider to proofread and correct errors, errors may still occur which can change documentation meaning.       Final diagnoses:  Allergic reaction, initial encounter  Acute pain of right knee  Acute pain of right shoulder    ED Discharge Orders          Ordered    hydrOXYzine  (ATARAX ) 25 MG tablet  Every 6 hours PRN        07/16/24 1949               Veta Palma, PA-C 07/16/24 1954    Neysa Caron PARAS, DO 07/16/24 2059

## 2024-07-16 NOTE — Discharge Instructions (Addendum)
 You were seen here today for your allergic reaction.  You were given several medications to help with this including famotidine , methylprednisolone , and hydroxyzine .  You were also given Toradol  for pain.  I prescribed the hydroxyzine  for you to use as needed for itching at home.  You may take this up to every 6 hours as needed for itching.  The x-rays of your right shoulder, right knee, and right tibia and fibula (shin bone) did not show any fracture or dislocation.  I suspect that you have a bone bruise of these areas which is causing your pain and swelling.  You may ice these areas tonight, then switch to heat tomorrow.  You may take up to 1000mg  of tylenol  every 6 hours as needed for pain.  Do not take more then 4g per day.  You may use up to 600mg  ibuprofen  every 6 hours as needed for pain.  Do not exceed 2.4g of ibuprofen  per day.  You were given your first dose here today, your next dose can be no sooner than midnight tonight.   Please follow-up with the orthopedic provider listed below if your shoulder pain is not starting to improve within the next 1 to 2 weeks.  Please return to the ER for any further allergic reactions, difficulty breathing, shortness of breath, anytime after using an EpiPen , any other new or concerning symptoms

## 2024-07-16 NOTE — ED Triage Notes (Signed)
 Patient reports allergic reaction last night after eating mushrooms, self-admin epi-pen & PO benadryl , c/o BUE/BLE and torso itching, also reports shoulder injury, A+Ox4, VSS, NAD noted.
